# Patient Record
Sex: Female | Born: 1945 | Race: White | Hispanic: No | Smoking: Former smoker
Health system: Southern US, Community
[De-identification: ages and names within clinical notes are randomized; demographics above are authoritative.]

## PROBLEM LIST (undated history)

## (undated) DIAGNOSIS — I2510 Atherosclerotic heart disease of native coronary artery without angina pectoris: Secondary | ICD-10-CM

## (undated) DIAGNOSIS — I509 Heart failure, unspecified: Secondary | ICD-10-CM

## (undated) DIAGNOSIS — E119 Type 2 diabetes mellitus without complications: Secondary | ICD-10-CM

## (undated) DIAGNOSIS — I639 Cerebral infarction, unspecified: Secondary | ICD-10-CM

## (undated) DIAGNOSIS — I219 Acute myocardial infarction, unspecified: Secondary | ICD-10-CM

## (undated) DIAGNOSIS — E785 Hyperlipidemia, unspecified: Secondary | ICD-10-CM

## (undated) DIAGNOSIS — I10 Essential (primary) hypertension: Secondary | ICD-10-CM

## (undated) HISTORY — DX: Hyperlipidemia, unspecified: E78.5

## (undated) HISTORY — DX: Cerebral infarction, unspecified: I63.9

## (undated) HISTORY — DX: Type 2 diabetes mellitus without complications: E11.9

## (undated) HISTORY — PX: THYROIDECTOMY: SHX17

## (undated) HISTORY — PX: STENT PLACE LEFT URETER (ARMC HX): HXRAD1254

## (undated) HISTORY — PX: CATARACT EXTRACTION W/ INTRAOCULAR LENS  IMPLANT, BILATERAL: SHX1307

---

## 2008-10-30 DIAGNOSIS — I509 Heart failure, unspecified: Secondary | ICD-10-CM

## 2008-10-30 DIAGNOSIS — I219 Acute myocardial infarction, unspecified: Secondary | ICD-10-CM

## 2008-10-30 HISTORY — DX: Heart failure, unspecified: I50.9

## 2008-10-30 HISTORY — PX: CARDIAC CATHETERIZATION: SHX172

## 2008-10-30 HISTORY — DX: Acute myocardial infarction, unspecified: I21.9

## 2009-10-08 ENCOUNTER — Ambulatory Visit: Payer: Self-pay | Admitting: Internal Medicine

## 2009-10-08 ENCOUNTER — Inpatient Hospital Stay: Payer: Self-pay | Admitting: Internal Medicine

## 2009-11-29 ENCOUNTER — Ambulatory Visit: Payer: Self-pay | Admitting: Internal Medicine

## 2010-04-09 ENCOUNTER — Ambulatory Visit: Payer: Self-pay | Admitting: Family Medicine

## 2012-06-26 ENCOUNTER — Other Ambulatory Visit: Payer: Self-pay | Admitting: Internal Medicine

## 2012-06-26 LAB — LIPID PANEL
Cholesterol: 276 mg/dL — ABNORMAL HIGH (ref 0–200)
HDL Cholesterol: 37 mg/dL — ABNORMAL LOW (ref 40–60)
VLDL Cholesterol, Calc: 65 mg/dL — ABNORMAL HIGH (ref 5–40)

## 2012-06-26 LAB — COMPREHENSIVE METABOLIC PANEL
Anion Gap: 7 (ref 7–16)
Bilirubin,Total: 0.4 mg/dL (ref 0.2–1.0)
Calcium, Total: 10.6 mg/dL — ABNORMAL HIGH (ref 8.5–10.1)
Chloride: 105 mmol/L (ref 98–107)
Co2: 26 mmol/L (ref 21–32)
EGFR (African American): >60
EGFR (Non-African Amer.): 56 — ABNORMAL LOW
Osmolality: 279 (ref 275–301)
Potassium: 4.3 mmol/L (ref 3.5–5.1)
Sodium: 138 mmol/L (ref 136–145)

## 2014-02-10 DIAGNOSIS — I209 Angina pectoris, unspecified: Secondary | ICD-10-CM | POA: Diagnosis not present

## 2014-02-10 DIAGNOSIS — I2510 Atherosclerotic heart disease of native coronary artery without angina pectoris: Secondary | ICD-10-CM | POA: Diagnosis not present

## 2014-02-10 DIAGNOSIS — I10 Essential (primary) hypertension: Secondary | ICD-10-CM | POA: Diagnosis not present

## 2014-08-19 DIAGNOSIS — E785 Hyperlipidemia, unspecified: Secondary | ICD-10-CM | POA: Diagnosis not present

## 2014-08-19 DIAGNOSIS — F17200 Nicotine dependence, unspecified, uncomplicated: Secondary | ICD-10-CM | POA: Diagnosis not present

## 2014-08-19 DIAGNOSIS — I2510 Atherosclerotic heart disease of native coronary artery without angina pectoris: Secondary | ICD-10-CM | POA: Diagnosis not present

## 2014-08-19 DIAGNOSIS — I10 Essential (primary) hypertension: Secondary | ICD-10-CM | POA: Diagnosis not present

## 2015-02-16 DIAGNOSIS — I252 Old myocardial infarction: Secondary | ICD-10-CM | POA: Diagnosis not present

## 2015-02-16 DIAGNOSIS — Z13220 Encounter for screening for lipoid disorders: Secondary | ICD-10-CM | POA: Diagnosis not present

## 2015-02-16 DIAGNOSIS — I10 Essential (primary) hypertension: Secondary | ICD-10-CM | POA: Diagnosis not present

## 2015-02-16 DIAGNOSIS — I2510 Atherosclerotic heart disease of native coronary artery without angina pectoris: Secondary | ICD-10-CM | POA: Diagnosis not present

## 2015-02-17 DIAGNOSIS — E785 Hyperlipidemia, unspecified: Secondary | ICD-10-CM | POA: Diagnosis not present

## 2015-03-04 ENCOUNTER — Inpatient Hospital Stay
Admission: EM | Admit: 2015-03-04 | Discharge: 2015-03-10 | DRG: 339 | Disposition: A | Discharge status: Home / Self Care | Payer: Medicare Other | Attending: Surgery | Admitting: Surgery

## 2015-03-04 ENCOUNTER — Ambulatory Visit
Admit: 2015-03-04 | Discharge: 2015-03-04 | Disposition: A | Discharge status: Home / Self Care | Payer: Medicare Other | Attending: *Deleted | Admitting: *Deleted

## 2015-03-04 ENCOUNTER — Ambulatory Visit (INDEPENDENT_AMBULATORY_CARE_PROVIDER_SITE_OTHER)
Admission: EM | Admit: 2015-03-04 | Discharge: 2015-03-04 | Disposition: A | Discharge status: Home / Self Care | Payer: Medicare Other | Source: Home / Self Care

## 2015-03-04 ENCOUNTER — Inpatient Hospital Stay: Payer: Medicare Other | Admitting: Anesthesiology

## 2015-03-04 ENCOUNTER — Ambulatory Visit: Payer: Medicare Other

## 2015-03-04 ENCOUNTER — Inpatient Hospital Stay: Admission: AD | Admit: 2015-03-04 | Payer: Self-pay | Admitting: Surgery

## 2015-03-04 ENCOUNTER — Encounter
Admission: EM | Disposition: A | Discharge status: Home / Self Care | Payer: Self-pay | Source: Home / Self Care | Attending: Surgery

## 2015-03-04 DIAGNOSIS — K529 Noninfective gastroenteritis and colitis, unspecified: Secondary | ICD-10-CM | POA: Diagnosis not present

## 2015-03-04 DIAGNOSIS — I10 Essential (primary) hypertension: Secondary | ICD-10-CM | POA: Diagnosis present

## 2015-03-04 DIAGNOSIS — R1031 Right lower quadrant pain: Secondary | ICD-10-CM

## 2015-03-04 DIAGNOSIS — K353 Acute appendicitis with localized peritonitis: Secondary | ICD-10-CM | POA: Diagnosis present

## 2015-03-04 DIAGNOSIS — I509 Heart failure, unspecified: Secondary | ICD-10-CM | POA: Diagnosis present

## 2015-03-04 DIAGNOSIS — K3580 Unspecified acute appendicitis: Secondary | ICD-10-CM

## 2015-03-04 DIAGNOSIS — Z79899 Other long term (current) drug therapy: Secondary | ICD-10-CM | POA: Insufficient documentation

## 2015-03-04 DIAGNOSIS — R109 Unspecified abdominal pain: Secondary | ICD-10-CM

## 2015-03-04 DIAGNOSIS — Z7982 Long term (current) use of aspirin: Secondary | ICD-10-CM

## 2015-03-04 DIAGNOSIS — K913 Postprocedural intestinal obstruction: Secondary | ICD-10-CM | POA: Diagnosis present

## 2015-03-04 DIAGNOSIS — R1032 Left lower quadrant pain: Secondary | ICD-10-CM | POA: Diagnosis not present

## 2015-03-04 DIAGNOSIS — R110 Nausea: Secondary | ICD-10-CM

## 2015-03-04 DIAGNOSIS — R100 Acute abdomen: Secondary | ICD-10-CM | POA: Diagnosis not present

## 2015-03-04 DIAGNOSIS — Z87891 Personal history of nicotine dependence: Secondary | ICD-10-CM | POA: Diagnosis not present

## 2015-03-04 DIAGNOSIS — I2510 Atherosclerotic heart disease of native coronary artery without angina pectoris: Secondary | ICD-10-CM | POA: Diagnosis present

## 2015-03-04 HISTORY — DX: Heart failure, unspecified: I50.9

## 2015-03-04 HISTORY — DX: Atherosclerotic heart disease of native coronary artery without angina pectoris: I25.10

## 2015-03-04 HISTORY — PX: LAPAROSCOPIC APPENDECTOMY: SHX408

## 2015-03-04 HISTORY — DX: Essential (primary) hypertension: I10

## 2015-03-04 LAB — COMPREHENSIVE METABOLIC PANEL
ALT: 22 U/L (ref 14–54)
AST: 22 U/L (ref 15–41)
Albumin: 3.2 g/dL — ABNORMAL LOW (ref 3.5–5.0)
Alkaline Phosphatase: 128 U/L — ABNORMAL HIGH (ref 38–126)
Anion gap: 8 (ref 5–15)
BUN: 15 mg/dL (ref 6–20)
CO2: 24 mmol/L (ref 22–32)
Calcium: 10.3 mg/dL (ref 8.9–10.3)
Chloride: 103 mmol/L (ref 101–111)
Creatinine, Ser: 1.1 mg/dL — ABNORMAL HIGH (ref 0.44–1.00)
GFR calc Af Amer: 58 mL/min — ABNORMAL LOW (ref >60)
GFR calc non Af Amer: 50 mL/min — ABNORMAL LOW (ref >60)
Glucose, Bld: 171 mg/dL — ABNORMAL HIGH (ref 65–99)
Potassium: 4.5 mmol/L (ref 3.5–5.1)
Sodium: 135 mmol/L (ref 135–145)
Total Bilirubin: 0.6 mg/dL (ref 0.3–1.2)
Total Protein: 7.7 g/dL (ref 6.5–8.1)

## 2015-03-04 LAB — CBC WITH DIFFERENTIAL/PLATELET
Basophils Absolute: 0.1 10*3/uL (ref 0–0.1)
Basophils Relative: 1 %
Eosinophils Absolute: 0 10*3/uL (ref 0–0.7)
Eosinophils Relative: 0 %
HCT: 36.5 % (ref 35.0–47.0)
Hemoglobin: 12.1 g/dL (ref 12.0–16.0)
Lymphocytes Relative: 12 %
Lymphs Abs: 1.4 10*3/uL (ref 1.0–3.6)
MCH: 29.8 pg (ref 26.0–34.0)
MCHC: 33.2 g/dL (ref 32.0–36.0)
MCV: 89.8 fL (ref 80.0–100.0)
Monocytes Absolute: 1.2 10*3/uL — ABNORMAL HIGH (ref 0.2–0.9)
Monocytes Relative: 10 %
Neutro Abs: 8.9 10*3/uL — ABNORMAL HIGH (ref 1.4–6.5)
Neutrophils Relative %: 77 %
Platelets: 209 10*3/uL (ref 150–440)
RBC: 4.06 MIL/uL (ref 3.80–5.20)
RDW: 13.2 % (ref 11.5–14.5)
WBC: 11.7 10*3/uL — ABNORMAL HIGH (ref 3.6–11.0)

## 2015-03-04 LAB — LIPASE, BLOOD: Lipase: 44 U/L (ref 22–51)

## 2015-03-04 LAB — URINALYSIS COMPLETE WITH MICROSCOPIC (ARMC ONLY)
GLUCOSE, UA: NEGATIVE mg/dL
Ketones, ur: NEGATIVE mg/dL
Leukocytes, UA: NEGATIVE
Nitrite: NEGATIVE
PROTEIN: 100 mg/dL — AB
SPECIFIC GRAVITY, URINE: 1.03 (ref 1.005–1.030)
pH: 5.5 (ref 5.0–8.0)

## 2015-03-04 LAB — AMYLASE: Amylase: 52 U/L (ref 28–100)

## 2015-03-04 SURGERY — APPENDECTOMY, LAPAROSCOPIC
Anesthesia: General

## 2015-03-04 MED ORDER — PHENYLEPHRINE HCL 10 MG/ML IJ SOLN
INTRAMUSCULAR | Status: DC | PRN
  Administered 2015-03-04 (×2): 200 ug via INTRAVENOUS

## 2015-03-04 MED ORDER — ONDANSETRON HCL 4 MG/2ML IJ SOLN
INTRAMUSCULAR | Status: DC | PRN
  Administered 2015-03-04: 4 mg via INTRAVENOUS

## 2015-03-04 MED ORDER — FENTANYL CITRATE (PF) 100 MCG/2ML IJ SOLN
INTRAMUSCULAR | Status: DC | PRN
  Administered 2015-03-04: 100 ug via INTRAVENOUS
  Administered 2015-03-04: 50 ug via INTRAVENOUS

## 2015-03-04 MED ORDER — ONDANSETRON HCL 4 MG/2ML IJ SOLN: 4.0000 mg | Freq: Once | INTRAMUSCULAR | Status: AC | PRN

## 2015-03-04 MED ORDER — NEOSTIGMINE METHYLSULFATE 10 MG/10ML IV SOLN
INTRAVENOUS | Status: DC | PRN
  Administered 2015-03-04: 3 mg via INTRAVENOUS

## 2015-03-04 MED ORDER — HYDROCODONE-ACETAMINOPHEN 5-325 MG PO TABS: 1.0000 | ORAL_TABLET | Freq: Four times a day (QID) | ORAL | Status: DC | PRN

## 2015-03-04 MED ORDER — GLYCOPYRROLATE 0.2 MG/ML IJ SOLN
INTRAMUSCULAR | Status: DC | PRN
  Administered 2015-03-04: .5 mg via INTRAVENOUS

## 2015-03-04 MED ORDER — FENTANYL CITRATE (PF) 100 MCG/2ML IJ SOLN: 25.0000 ug | INTRAMUSCULAR | Status: DC | PRN

## 2015-03-04 MED ORDER — CEFTRIAXONE SODIUM IN DEXTROSE 20 MG/ML IV SOLN
1.0000 g | Freq: Once | INTRAVENOUS | Status: DC
  Filled 2015-03-04: qty 50

## 2015-03-04 MED ORDER — ACETAMINOPHEN 325 MG PO TABS: 650.0000 mg | ORAL_TABLET | Freq: Four times a day (QID) | ORAL | Status: DC | PRN

## 2015-03-04 MED ORDER — MIDAZOLAM HCL 2 MG/2ML IJ SOLN
INTRAMUSCULAR | Status: DC | PRN
  Administered 2015-03-04: 1 mg via INTRAVENOUS

## 2015-03-04 MED ORDER — PROMETHAZINE HCL 25 MG/ML IJ SOLN
INTRAMUSCULAR | Status: AC
  Filled 2015-03-04: qty 1

## 2015-03-04 MED ORDER — HEPARIN SODIUM (PORCINE) 5000 UNIT/ML IJ SOLN
5000.0000 [IU] | Freq: Three times a day (TID) | INTRAMUSCULAR | Status: DC
  Administered 2015-03-05 – 2015-03-10 (×15): 5000 [IU] via SUBCUTANEOUS
  Filled 2015-03-04 (×15): qty 1

## 2015-03-04 MED ORDER — CEFTRIAXONE SODIUM 1 G IJ SOLR
INTRAMUSCULAR | Status: AC
  Filled 2015-03-04: qty 10

## 2015-03-04 MED ORDER — ROCURONIUM BROMIDE 100 MG/10ML IV SOLN
INTRAVENOUS | Status: DC | PRN
  Administered 2015-03-04: 30 mg via INTRAVENOUS

## 2015-03-04 MED ORDER — DIPHENHYDRAMINE HCL 12.5 MG/5ML PO ELIX
12.5000 mg | ORAL_SOLUTION | Freq: Four times a day (QID) | ORAL | Status: DC | PRN
  Filled 2015-03-04: qty 5

## 2015-03-04 MED ORDER — PROPOFOL 10 MG/ML IV BOLUS
INTRAVENOUS | Status: DC | PRN
  Administered 2015-03-04: 100 mg via INTRAVENOUS

## 2015-03-04 MED ORDER — PROMETHAZINE HCL 25 MG/ML IJ SOLN
12.5000 mg | INTRAMUSCULAR | Status: DC | PRN
  Administered 2015-03-04 – 2015-03-08 (×2): 12.5 mg via INTRAVENOUS
  Filled 2015-03-04: qty 1

## 2015-03-04 MED ORDER — SUCCINYLCHOLINE CHLORIDE 20 MG/ML IJ SOLN
INTRAMUSCULAR | Status: DC | PRN
  Administered 2015-03-04: 100 mg via INTRAVENOUS

## 2015-03-04 MED ORDER — SODIUM CHLORIDE 0.9 % IV SOLN
INTRAVENOUS | Status: DC
  Administered 2015-03-04 – 2015-03-10 (×9): via INTRAVENOUS

## 2015-03-04 MED ORDER — MORPHINE SULFATE 2 MG/ML IJ SOLN
1.0000 mg | INTRAMUSCULAR | Status: DC | PRN
  Administered 2015-03-05: 1 mg via INTRAVENOUS
  Filled 2015-03-04: qty 1

## 2015-03-04 MED ORDER — IOHEXOL 300 MG/ML  SOLN
100.0000 mL | Freq: Once | INTRAMUSCULAR | Status: AC | PRN
  Administered 2015-03-04: 100 mL via INTRAVENOUS

## 2015-03-04 MED ORDER — HYDROMORPHONE HCL 1 MG/ML IJ SOLN: 1.0000 mg | INTRAMUSCULAR | Status: DC | PRN

## 2015-03-04 MED ORDER — BUPIVACAINE HCL 0.25 % IJ SOLN
INTRAMUSCULAR | Status: DC | PRN
  Administered 2015-03-04: 15 mL

## 2015-03-04 MED ORDER — LIDOCAINE HCL (CARDIAC) 20 MG/ML IV SOLN
INTRAVENOUS | Status: DC | PRN
  Administered 2015-03-04: 100 mg via INTRAVENOUS

## 2015-03-04 MED ORDER — DIPHENHYDRAMINE HCL 50 MG/ML IJ SOLN: 12.5000 mg | Freq: Four times a day (QID) | INTRAMUSCULAR | Status: DC | PRN

## 2015-03-04 MED ORDER — HYDROMORPHONE HCL 1 MG/ML IJ SOLN
INTRAMUSCULAR | Status: AC
  Administered 2015-03-04: 1 mg via INTRAVENOUS
  Filled 2015-03-04: qty 1

## 2015-03-04 MED ORDER — LACTATED RINGERS IV SOLN
INTRAVENOUS | Status: DC | PRN
  Administered 2015-03-04: 22:00:00 via INTRAVENOUS

## 2015-03-04 MED ORDER — CLINDAMYCIN PHOSPHATE 600 MG/50ML IV SOLN: 600.0000 mg | Freq: Once | INTRAVENOUS | Status: DC

## 2015-03-04 MED ORDER — CLINDAMYCIN PHOSPHATE 600 MG/50ML IV SOLN
600.0000 mg | Freq: Once | INTRAVENOUS | Status: AC
  Administered 2015-03-04: 600 mg via INTRAVENOUS

## 2015-03-04 MED ORDER — ACETAMINOPHEN 650 MG RE SUPP: 650.0000 mg | Freq: Four times a day (QID) | RECTAL | Status: DC | PRN

## 2015-03-04 MED ORDER — CEFTRIAXONE SODIUM 1 G IJ SOLR
1.0000 g | Freq: Once | INTRAMUSCULAR | Status: AC
Start: 2015-03-04 — End: 2015-03-04
  Administered 2015-03-04: 1 g via INTRAVENOUS

## 2015-03-04 MED ORDER — CLINDAMYCIN PHOSPHATE 600 MG/50ML IV SOLN
INTRAVENOUS | Status: AC
Start: 2015-03-04 — End: 2015-03-04
  Administered 2015-03-04: 600 mg via INTRAVENOUS
  Filled 2015-03-04: qty 50

## 2015-03-04 SURGICAL SUPPLY — 51 items
BENZOIN TINCTURE PRP APPL 2/3 (GAUZE/BANDAGES/DRESSINGS) ×3 IMPLANT
CANISTER SUCT 1200ML W/VALVE (MISCELLANEOUS) ×6 IMPLANT
CASTING MATERIAL DELTA LITE (CAST SUPPLIES) ×3 IMPLANT
CATH KIT 16FR FOLEY ×3 IMPLANT
CHLORAPREP W/TINT 26ML (MISCELLANEOUS) ×3 IMPLANT
CLEARIFY VISUALIZATION ×3 IMPLANT
CLOSURE WOUND 1/2 X4 (GAUZE/BANDAGES/DRESSINGS) ×1
CUTTER LINEAR ENDO 35 ART FLEX (STAPLE) ×3 IMPLANT
CUTTER LINEAR ENDO 35 ETS (STAPLE) IMPLANT
DEFOGGER SCOPE WARMER CLEARIFY (MISCELLANEOUS) ×3 IMPLANT
DRAPE SHEET LG 3/4 BI-LAMINATE (DRAPES) ×3 IMPLANT
DRAPE UTILITY 15X26 TOWEL STRL (DRAPES) ×6 IMPLANT
DRESSING TELFA 4X3 1S ST N-ADH (GAUZE/BANDAGES/DRESSINGS) IMPLANT
DRSG TEGADERM 2-3/8X2-3/4 SM (GAUZE/BANDAGES/DRESSINGS) ×9 IMPLANT
DRSG TEGADERM 2X2.25 PEDS (GAUZE/BANDAGES/DRESSINGS) ×3 IMPLANT
DRSG TELFA 3X8 NADH (GAUZE/BANDAGES/DRESSINGS) ×3 IMPLANT
ENDOPOUCH RETRIEVER 10 (MISCELLANEOUS) ×3 IMPLANT
GAUZE SPONGE 4X4 12PLY STRL (GAUZE/BANDAGES/DRESSINGS) IMPLANT
GLOVE BIO SURGEON STRL SZ7.5 (GLOVE) ×3 IMPLANT
GOWN STRL REUS W/ TWL LRG LVL3 (GOWN DISPOSABLE) ×1 IMPLANT
GOWN STRL REUS W/ TWL XL LVL3 (GOWN DISPOSABLE) ×1 IMPLANT
GOWN STRL REUS W/TWL LRG LVL3 (GOWN DISPOSABLE) ×2
GOWN STRL REUS W/TWL XL LVL3 (GOWN DISPOSABLE) ×2
IRRIGATION STRYKERFLOW (MISCELLANEOUS) ×1 IMPLANT
IRRIGATOR STRYKERFLOW (MISCELLANEOUS) ×3
IV NS 1000ML (IV SOLUTION) ×2
IV NS 1000ML BAXH (IV SOLUTION) ×1 IMPLANT
LABEL OR SOLS (LABEL) IMPLANT
NDL SAFETY 25GX1.5 (NEEDLE) ×3 IMPLANT
NS IRRIG 500ML POUR BTL (IV SOLUTION) ×3 IMPLANT
PACK LAP CHOLECYSTECTOMY (MISCELLANEOUS) ×3 IMPLANT
PAD GROUND ADULT SPLIT (MISCELLANEOUS) ×3 IMPLANT
RELOAD CUTTER ETS 35MM STAND (ENDOMECHANICALS) IMPLANT
SCD ×3 IMPLANT
SCISSORS METZENBAUM CVD 33 (INSTRUMENTS) ×3 IMPLANT
SHEARS HARMONIC ACE PLUS 36CM (ENDOMECHANICALS) ×3 IMPLANT
SLEEVE ENDOPATH XCEL 5M (ENDOMECHANICALS) ×3 IMPLANT
STRAP SAFETY BODY (MISCELLANEOUS) ×3 IMPLANT
STRIP CLOSURE SKIN 1/2X4 (GAUZE/BANDAGES/DRESSINGS) ×2 IMPLANT
SUT ETHILON 4-0 (SUTURE) ×4
SUT ETHILON 4-0 FS2 18XMFL BLK (SUTURE) ×2
SUT VIC AB 0 UR5 27 (SUTURE) ×6 IMPLANT
SUT VIC AB 4-0 RB1 27 (SUTURE) ×2
SUT VIC AB 4-0 RB1 27X BRD (SUTURE) ×1 IMPLANT
SUTURE ETHLN 4-0 FS2 18XMF BLK (SUTURE) ×2 IMPLANT
SWABSTK COMLB BENZOIN TINCTURE (MISCELLANEOUS) ×3 IMPLANT
TROCAR XCEL 12X100 BLDLESS (ENDOMECHANICALS) ×3 IMPLANT
TROCAR XCEL BLUNT TIP 100MML (ENDOMECHANICALS) ×3 IMPLANT
TROCAR XCEL NON-BLD 5MMX100MML (ENDOMECHANICALS) ×3 IMPLANT
TUBING INSUFFLATOR HI FLOW (MISCELLANEOUS) ×3 IMPLANT
TUBING SCD EXTENSION 9995 GYN (TUBING) ×3 IMPLANT

## 2015-03-04 SURGICAL SUPPLY — 38 items
CANISTER SUCT 1200ML W/VALVE (MISCELLANEOUS) IMPLANT
CHLORAPREP W/TINT 26ML (MISCELLANEOUS) IMPLANT
CUTTER LINEAR ENDO 35 ART FLEX (STAPLE) IMPLANT
CUTTER LINEAR ENDO 35 ETS (STAPLE) IMPLANT
DRAPE SHEET LG 3/4 BI-LAMINATE (DRAPES) IMPLANT
DRAPE UTILITY 15X26 TOWEL STRL (DRAPES) IMPLANT
DRESSING TELFA 4X3 1S ST N-ADH (GAUZE/BANDAGES/DRESSINGS) IMPLANT
DRSG TEGADERM 2-3/8X2-3/4 SM (GAUZE/BANDAGES/DRESSINGS) IMPLANT
ENDOPOUCH RETRIEVER 10 (MISCELLANEOUS) IMPLANT
GAUZE SPONGE 4X4 12PLY STRL (GAUZE/BANDAGES/DRESSINGS) IMPLANT
GLOVE BIO SURGEON STRL SZ7.5 (GLOVE) IMPLANT
GOWN STRL REUS W/ TWL LRG LVL3 (GOWN DISPOSABLE) IMPLANT
GOWN STRL REUS W/ TWL XL LVL3 (GOWN DISPOSABLE) IMPLANT
GOWN STRL REUS W/TWL LRG LVL3 (GOWN DISPOSABLE)
GOWN STRL REUS W/TWL XL LVL3 (GOWN DISPOSABLE)
IRRIGATION STRYKERFLOW (MISCELLANEOUS) IMPLANT
IRRIGATOR STRYKERFLOW (MISCELLANEOUS)
IV NS 1000ML (IV SOLUTION)
IV NS 1000ML BAXH (IV SOLUTION) IMPLANT
LABEL OR SOLS (LABEL) IMPLANT
NDL SAFETY 25GX1.5 (NEEDLE) IMPLANT
NS IRRIG 500ML POUR BTL (IV SOLUTION) IMPLANT
PACK LAP CHOLECYSTECTOMY (MISCELLANEOUS) IMPLANT
PAD GROUND ADULT SPLIT (MISCELLANEOUS) IMPLANT
RELOAD CUTTER ETS 35MM STAND (ENDOMECHANICALS) IMPLANT
SCISSORS METZENBAUM CVD 33 (INSTRUMENTS) IMPLANT
SHEARS HARMONIC ACE PLUS 36CM (ENDOMECHANICALS) IMPLANT
SLEEVE ENDOPATH XCEL 5M (ENDOMECHANICALS) IMPLANT
STRAP SAFETY BODY (MISCELLANEOUS) IMPLANT
STRIP CLOSURE SKIN 1/2X4 (GAUZE/BANDAGES/DRESSINGS) IMPLANT
SUT VIC AB 0 UR5 27 (SUTURE) IMPLANT
SUT VIC AB 4-0 RB1 27 (SUTURE)
SUT VIC AB 4-0 RB1 27X BRD (SUTURE) IMPLANT
SWABSTK COMLB BENZOIN TINCTURE (MISCELLANEOUS) IMPLANT
TROCAR XCEL 12X100 BLDLESS (ENDOMECHANICALS) IMPLANT
TROCAR XCEL BLUNT TIP 100MML (ENDOMECHANICALS) IMPLANT
TROCAR XCEL NON-BLD 5MMX100MML (ENDOMECHANICALS) IMPLANT
TUBING INSUFFLATOR HI FLOW (MISCELLANEOUS) IMPLANT

## 2015-03-04 NOTE — Progress Notes (Signed)
Assuming care from Dr Leanora Cover  History, PE, labs and ct scan reviewed C/w acute appendicitis, likely retrocecal in origin.  Plan laparoscopic appendectomy later tonight.  Discussed procedure with her and multiple family members.  All questions addressed.  Orders reviewed.

## 2015-03-04 NOTE — ED Provider Notes (Signed)
CSN: [id number]     Arrival date & time 03/04/15  0927 History   None    Chief Complaint  Patient presents with  . Abdominal Pain   (Consider location/radiation/quality/duration/timing/severity/associated sxs/prior Treatment) Patient is a 69 y.o. female presenting with abdominal pain. The history is provided by the patient and a relative. No language interpreter was used.  Abdominal Pain Pain location:  RLQ and LLQ Pain quality: cramping   Pain radiates to:  Does not radiate Pain severity:  Moderate (5/10) Onset quality:  Sudden Duration: 4-5. Timing:  Constant Progression:  Waxing and waning Chronicity:  New Context: recent illness   Context: not diet changes, not laxative use, not recent travel, not sick contacts, not suspicious food intake and not trauma   Relieved by:  OTC medications (ibuprofen) Associated symptoms: nausea and vomiting   Associated symptoms: no belching, no chest pain, no chills, no constipation, no diarrhea, no dysuria, no fever, no hematuria, no shortness of breath, no vaginal bleeding and no vaginal discharge   Risk factors: being elderly and obesity     Past Medical History  Diagnosis Date  . Coronary artery disease   . CHF (congestive heart failure) 2010  . Hypertension    Past Surgical History  Procedure Laterality Date  . Stent place left ureter ([facility] hx)      cardiac  . Thyroidectomy Bilateral    Family History  Problem Relation Age of Onset  . Heart block Mother   . Heart block Father    History  Substance Use Topics  . Smoking status: Former Smoker    Quit date: 03/03/2009  . Smokeless tobacco: Not on file  . Alcohol Use: No   OB History    No data available     Review of Systems  Constitutional: Positive for appetite change. Negative for fever and chills.  HENT: Negative.   Respiratory: Negative for shortness of breath.   Cardiovascular: Negative for chest pain.  Gastrointestinal: Positive for nausea, vomiting and abdominal  pain. Negative for diarrhea and constipation.  Endocrine: Negative.   Genitourinary: Negative for dysuria, hematuria, vaginal bleeding and vaginal discharge.  Skin: Negative for rash.  Allergic/Immunologic: Negative.   Neurological: Negative.   Hematological: Negative.   Psychiatric/Behavioral: Negative.   All other systems reviewed and are negative.   Allergies  Review of patient's allergies indicates no known allergies.  Home Medications   Prior to Admission medications   Medication Sig Start Date End Date Taking? Authorizing Provider  aspirin 81 MG tablet Take 81 mg by mouth daily.   Yes Historical Provider, MD  atorvastatin (LIPITOR) 80 MG tablet Take 80 mg by mouth daily.   Yes Historical Provider, MD  irbesartan (AVAPRO) 75 MG tablet Take 75 mg by mouth daily.   Yes Historical Provider, MD  metoprolol (LOPRESSOR) 100 MG tablet Take 100 mg by mouth 2 (two) times daily.   Yes Historical Provider, MD   BP 126/72 mmHg  Pulse 88  Temp(Src) 96.7 F (35.9 C) (Tympanic)  Resp 16  Ht 5\' 1"  (1.549 m)  Wt 164 lb (74.39 kg)  BMI 31.00 kg/m2  SpO2 94% Physical Exam  Constitutional: She appears well-developed and well-nourished. She is active and cooperative.  HENT:  Head: Normocephalic.  Right Ear: Tympanic membrane normal.  Left Ear: Tympanic membrane normal.  Nose: Nose normal.  Mouth/Throat: Uvula is midline, oropharynx is clear and moist and mucous membranes are normal.  Neck: Trachea normal.  Cardiovascular: Normal rate, regular rhythm, normal heart  sounds and normal pulses.   Pulmonary/Chest: Effort normal and breath sounds normal.  Abdominal: Normal appearance and bowel sounds are normal. There is tenderness in the right lower quadrant and left lower quadrant. There is no rebound. No hernia.  Neurological: She is alert. No cranial nerve deficit or sensory deficit. GCS eye subscore is 4. GCS verbal subscore is 5. GCS motor subscore is 6.  Psychiatric: She has a normal mood  and affect. Her speech is normal.  Nursing note and vitals reviewed.   ED Course  Procedures (including critical care time) Labs Review Labs Reviewed  URINALYSIS COMPLETEWITH MICROSCOPIC [facility])  - Abnormal; Notable for the following:    APPearance HAZY (*)    Bilirubin Urine 1+ (*)    Hgb urine dipstick 1+ (*)    Protein, ur 100 (*)    Bacteria, UA FEW (*)    Squamous Epithelial / LPF 6-30 (*)    All other components within normal limits  CBC WITH DIFFERENTIAL/PLATELET - Abnormal; Notable for the following:    WBC 11.7 (*)    Neutro Abs 8.9 (*)    Monocytes Absolute 1.2 (*)    All other components within normal limits  COMPREHENSIVE METABOLIC PANEL - Abnormal; Notable for the following:    Glucose, Bld 171 (*)    Creatinine, Ser 1.10 (*)    Albumin 3.2 (*)    Alkaline Phosphatase 128 (*)    GFR calc non Af Amer 50 (*)    GFR calc Af Amer 58 (*)    All other components within normal limits  AMYLASE  LIPASE, BLOOD   Results for orders placed or performed during the hospital encounter of 03/04/15  Urinalysis complete, with microscopic  Result Value Ref Range   Color, Urine YELLOW YELLOW   APPearance HAZY (A) CLEAR   Glucose, UA NEGATIVE NEGATIVE mg/dL   Bilirubin Urine 1+ (A) NEGATIVE   Ketones, ur NEGATIVE NEGATIVE mg/dL   Specific Gravity, Urine 1.030 1.005 - 1.030   Hgb urine dipstick 1+ (A) NEGATIVE   pH 5.5 5.0 - 8.0   Protein, ur 100 (A) NEGATIVE mg/dL   Nitrite NEGATIVE NEGATIVE   Leukocytes, UA NEGATIVE NEGATIVE   RBC / HPF 0-5 <3 RBC/hpf   WBC, UA 0-5 <3 WBC/hpf   Bacteria, UA FEW (A) RARE   Squamous Epithelial / LPF 6-30 (A) RARE  CBC with Differential  Result Value Ref Range   WBC 11.7 (H) 3.6 - 11.0 K/uL   RBC 4.06 3.80 - 5.20 MIL/uL   Hemoglobin 12.1 12.0 - 16.0 g/dL   HCT 36.5 35.0 - 47.0 %   MCV 89.8 80.0 - 100.0 fL   MCH 29.8 26.0 - 34.0 pg   MCHC 33.2 32.0 - 36.0 g/dL   RDW 13.2 11.5 - 14.5 %   Platelets 209 150 - 440 K/uL   Neutrophils  Relative % 77 %   Neutro Abs 8.9 (H) 1.4 - 6.5 K/uL   Lymphocytes Relative 12 %   Lymphs Abs 1.4 1.0 - 3.6 K/uL   Monocytes Relative 10 %   Monocytes Absolute 1.2 (H) 0.2 - 0.9 K/uL   Eosinophils Relative 0 %   Eosinophils Absolute 0.0 0 - 0.7 K/uL   Basophils Relative 1 %   Basophils Absolute 0.1 0 - 0.1 K/uL  Comprehensive metabolic panel  Result Value Ref Range   Sodium 135 135 - 145 mmol/L   Potassium 4.5 3.5 - 5.1 mmol/L   Chloride 103 101 - 111 mmol/L  CO2 24 22 - 32 mmol/L   Glucose, Bld 171 (H) 65 - 99 mg/dL   BUN 15 6 - 20 mg/dL   Creatinine, Ser 1.10 (H) 0.44 - 1.00 mg/dL   Calcium 10.3 8.9 - 10.3 mg/dL   Total Protein 7.7 6.5 - 8.1 g/dL   Albumin 3.2 (L) 3.5 - 5.0 g/dL   AST 22 15 - 41 U/L   ALT 22 14 - 54 U/L   Alkaline Phosphatase 128 (H) 38 - 126 U/L   Total Bilirubin 0.6 0.3 - 1.2 mg/dL   GFR calc non Af Amer 50 (L) >60 mL/min   GFR calc Af Amer 58 (L) >60 mL/min   Anion gap 8 5 - 15  Amylase  Result Value Ref Range   Amylase 52 28 - 100 U/L  Lipase, blood  Result Value Ref Range   Lipase 44 22 - 51 U/L    Imaging Review Ct Abdomen Pelvis W Contrast  03/04/2015   CLINICAL DATA:  69 year old female with abdominal pain for 5 days. Initial encounter.  EXAM: CT ABDOMEN AND PELVIS WITH CONTRAST  TECHNIQUE: Multidetector CT imaging of the abdomen and pelvis was performed using the standard protocol following bolus administration of intravenous contrast.  CONTRAST:  155mL OMNIPAQUE IOHEXOL 300 MG/ML  SOLN  COMPARISON:  Abdominal series 1141 hr today. CT Abdomen and Pelvis 10/08/2009.  FINDINGS: Increased platelike atelectasis along the major fissures today, but otherwise improved lung base ventilation compared to 2010. No pericardial or pleural effusion.  No acute osseous abnormality identified.  No pelvic free fluid. Negative rectum. Negative uterus and adnexa. Diminutive bladder.  Mild sigmoid diverticulosis, no definite active inflammation. Negative left colon.  Negative transverse colon.  Oral contrast has reached the hepatic flexure. There is confluent inflammation in the right lower quadrant affecting both the cecum and distal small bowel, but epicenter of the inflammation is at the expected origin of the appendix. Today no normal appendix is identified, but rather there is an abnormal elongated fluid collection measuring up to 16 mm diameter and 5 cm in length (see coronal image 73) with adjacent probable thickened and inflamed appendix, containing no gas or contrast. See series 2, image 62. Associated large and small bowel mesenteric stranding in this region. No bowel obstruction.  No dilated small bowel. Negative stomach and duodenum. No abdominal free fluid. Liver, spleen, pancreas and adrenal glands are within normal limits. Chronic cholelithiasis. Stable biliary tree. No abdominal free fluid or free air. Renal enhancement and contrast excretion within normal limits. Portal venous system is patent. Aortoiliac calcified atherosclerosis noted. Major arterial structures are patent. There is ectasia of the infrarenal abdominal aorta measuring up to 25 mm diameter. No lymphadenopathy.  IMPRESSION: 1. Moderate to severe right lower quadrant inflammation favored due to acute appendicitis. An elongated 5 cm x 1.6 cm fluid collection is suspicious for abscess along the inflamed appendix. See coronal image 73. No free fluid elsewhere and no free air. 2. Secondary inflammation of the cecum and distal small bowel. No bowel obstruction. 3. 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 Recommend followup by ultrasound in 5 years. This recommendation follows ACR consensus guidelines: White Paper of the ACR Incidental Findings Committee II on Vascular Findings. J Am Coll Radiol 2013; 10:789-794.   Electronically Signed   By: Genevie Ann M.D.   On: 03/04/2015 15:12   Dg Abd 2 Views  03/04/2015   CLINICAL DATA:  Right lower quadrant pain lie now with  bilateral lower quadrant pain for several days and nausea  EXAM: ABDOMEN - 2 VIEW  COMPARISON:  CT abdomen pelvis of 10/08/2009  FINDINGS: Supine and erect views of the abdomen show no bowel obstruction. No free air is seen. There are calcifications in the right upper quadrant which correlating with CT probably represent small gallstones. No definite calcifications are seen overlying the renal outlines. No bony abnormality is seen. Chronic change at the lung bases remains.  IMPRESSION: No bowel obstruction.  No free air.  Probable small gallstones.   Electronically Signed   By: Ivar Drape M.D.   On: 03/04/2015 12:02     Chief Complaint    Abdominal Pain       MDM   1. Abdominal pain, acute   2. Acute appendicitis, unspecified acute appendicitis type     1110: Labs pending. ABD XRAY pending.   1525: Received verbal report from radiology acute appendicitis with possible abscess/fluid collection.Spoke with pt who is at [facility] CT advised of CT findings and need to go straight to Er for further evaluation/surgery eval. Pt to remain NPO. Pt verbalized understanding to this provider. Dr. Alveta Heimlich, attending aware of pt status and agrees with plan of care.   Tori Milks, NP 38/18/40 [id number]

## 2015-03-04 NOTE — ED Notes (Signed)
Saturday afternoon after eating, sudden vomiting-vomited many times. Subsided Sunday morning. Slight RLQ pain Sunday-intermittent. Today pain LLQ-intermittent. No nausea. Denies fever. Color pink, skin cool and dry

## 2015-03-04 NOTE — Anesthesia Postprocedure Evaluation (Signed)
  Anesthesia Post-op Note  Patient: Patricia Lawson  Procedure(s) Performed: Procedure(s): APPENDECTOMY LAPAROSCOPIC (N/A)  Anesthesia type:General  Patient location: PACU  Post pain: Pain level controlled  Post assessment: Post-op Vital signs reviewed, Patient's Cardiovascular Status Stable, Respiratory Function Stable, Patent Airway and No signs of Nausea or vomiting  Post vital signs: Reviewed and stable  Last Vitals:  Filed Vitals:   03/04/15 1923  BP: 149/72  Pulse: 85  Temp: 37.1 C  Resp: 16    Level of consciousness: awake, alert  and patient cooperative  Complications: No apparent anesthesia complications

## 2015-03-04 NOTE — Progress Notes (Addendum)
Patient ID: Patricia Lawson, female   DOB: [birth year], 69 y.o.   MRN: [id number] 03/04/2015 OPERATIVE NOTE   10:56 PM  PATIENT:  Patricia Lawson  69 y.o. female  PRE-OPERATIVE DIAGNOSIS:  acute appendicitis with rupture   POST-OPERATIVE DIAGNOSIS:  same as preop  PROCEDURE:  Procedure(s): APPENDECTOMY LAPAROSCOPIC (N/A)  SURGEON:  Surgeon(s) and Role:    Sherri Rad, MD - Primary  Findings acute appendicitis with localized perforation and pus  Specimens appendix to pathology  EBL blood loss 25 cc  Lap and needle count correct 2   DICTATION:    With informed consent supine position general endotracheal anesthesia the patient's abdomen was clipped of hair sterilely prepped and draped with ChloraPrep solution and a time out was observed.  A 12 mm blunt Hassan trocar was placed in an open technique through an infra-umbilical transverse skin incision with stay sutures being passed to the fascia. Pneumoperitoneum was established. A 5 mm blade less trocar later exchanged with 12 mm bladed trocar was placed in the right upper quadrant along with a 5 mm bladed trocar in the left lower quadrant. There were adhesions of small bowel to the right lower quadrant which were taken down blunt technique and a large amount of pus was extruded and aspirated.  The appendix is identified at the confluence of the tinea along with the terminal ileum. Mesoappendix was divided with the Harmonic scalpel.  The base of the appendix was clearly identified. A GIA stapler with blue load application was applied and single fire demonstrated specimen to be retrievable through an Endo Catch device. Pneumoperitoneum was reestablished. The right lower quadrant and abdomen was irrigated with total of 2 L of normal saline during the operation and aspirated dry. Hemostasis appeared excellent on the operative field.  Ports are then removed under direct visualization. The infraorbital fascial defect. Approximately with an  additional figure-of-eight #0 Vicryl suture for orientation. A total of 30 cc of 0.25% plain Marcaine was infiltrated along all skin fascial incisions prior to closure. 4-0 nylon in simple and vertical mattress orientation was used to pre-approximate skin edges. Telfa and Tegaderm were then applied patient wasn't subsequently extubated to the recovery room in stable and satisfactory condition by anesthesia services.

## 2015-03-04 NOTE — Anesthesia Preprocedure Evaluation (Addendum)
Anesthesia Evaluation  Patient identified by MRN, date of birth, ID band Patient awake    History of Anesthesia Complications Negative for: history of anesthetic complications  Airway Mallampati: III       Dental   Pulmonary COPDCurrent Smoker, former smoker,  + rhonchi   + wheezing      Cardiovascular Exercise Tolerance: Poor hypertension, + CAD, + Past MI and +CHF Normal cardiovascular exam    Neuro/Psych Anxiety negative neurological ROS     GI/Hepatic negative GI ROS, Neg liver ROS,   Endo/Other  Hypothyroidism   Renal/GU   negative genitourinary   Musculoskeletal negative musculoskeletal ROS (+)   Abdominal (+)  Abdomen: rigid.    Peds negative pediatric ROS (+)  Hematology negative hematology ROS (+)   Anesthesia Other Findings   Reproductive/Obstetrics negative OB ROS                            Anesthesia Physical Anesthesia Plan  ASA: III and emergent  Anesthesia Plan: General   Post-op Pain Management:    Induction: Intravenous  Airway Management Planned: Oral ETT  Additional Equipment:   Intra-op Plan:   Post-operative Plan: Extubation in OR  Informed Consent: I have reviewed the patients History and Physical, chart, labs and discussed the procedure including the risks, benefits and alternatives for the proposed anesthesia with the patient or authorized representative who has indicated his/her understanding and acceptance.     Plan Discussed with: CRNA and Surgeon  Anesthesia Plan Comments:         Anesthesia Quick Evaluation

## 2015-03-04 NOTE — Anesthesia Procedure Notes (Signed)
Procedure Name: Intubation Date/Time: 03/04/2015 9:45 PM Performed by: Rosaria Ferries, Jahmai Finelli Pre-anesthesia Checklist: Patient identified, Emergency Drugs available, Suction available and Patient being monitored Patient Re-evaluated:Patient Re-evaluated prior to inductionOxygen Delivery Method: Circle system utilized Preoxygenation: Pre-oxygenation with 100% oxygen Laryngoscope Size: Mac and 3 Grade View: Grade I Tube type: Oral Tube size: 7.0 mm Number of attempts: 1 Placement Confirmation: ETT inserted through vocal cords under direct vision and breath sounds checked- equal and bilateral Secured at: 21 cm Tube secured with: Tape

## 2015-03-04 NOTE — ED Notes (Signed)
Patient is resting comfortably. 

## 2015-03-04 NOTE — ED Notes (Signed)
pt to ED room 17 from CT.  she was sent for acute appendicitis.   pt c/o RLQ pain x 4 days,  today she reports the pain became worse and now she is also having pain in the LLQ.  +decreased appetite,  nausea and vomiting on day one but none now

## 2015-03-04 NOTE — Transfer of Care (Signed)
Immediate Anesthesia Transfer of Care Note  Patient: Patricia Lawson  Procedure(s) Performed: Procedure(s): APPENDECTOMY LAPAROSCOPIC (N/A)  Patient Location: PACU  Anesthesia Type:General  Level of Consciousness: awake, alert  and oriented  Airway & Oxygen Therapy: Patient Spontanous Breathing and Patient connected to nasal cannula oxygen  Post-op Assessment: Report given to RN, Post -op Vital signs reviewed and stable and Patient moving all extremities X 4  Post vital signs: Reviewed and stable  Last Vitals:  Filed Vitals:   03/04/15 1923  BP: 149/72  Pulse: 85  Temp: 37.1 C  Resp: 16    Complications: No apparent anesthesia complications

## 2015-03-04 NOTE — H&P (Signed)
Patient ID: Patricia Lawson, female   DOB: [birth year], 69 y.o.   MRN: [id number]  HPI Patricia Lawson is a 69 y.o. female with vomiting since Saturday. She had some intermittent abdominal pain on Sunday. This pain got significantly worse and became bilateral in her lower quadrants this morning and she sought medical attention. She denies fever, chills, anorexia and any further nausea or vomiting, and in fact she says she is hungry. Her last meal was yesterday. No associated hematemesis, diarrhea, melena, or bright red blood per rectum.  Past Medical History  Diagnosis Date  . Coronary artery disease   . CHF (congestive heart failure) 2010  . Hypertension     Past Surgical History  Procedure Laterality Date  . Stent place left ureter ([facility] hx)      cardiac  . Thyroidectomy Bilateral     Family History  Problem Relation Age of Onset  . Heart block Mother   . Heart block Father    patient's mother died at age 10 of congestive heart failure and her father died at age 34 of mesenteric ischemia.  Social History History  Substance Use Topics  . Smoking status: Former Smoker    Quit date: 03/03/2009  . Smokeless tobacco: Not on file  . Alcohol Use: No   patient is divorced, she lives alone, she does not smoke and does not drink. She retired from working in [employer] about 10 years ago.  No Known Allergies  Current Facility-Administered Medications  Medication Dose Route Frequency Provider Last Rate Last Dose  . cefTRIAXone (ROCEPHIN) 1 g in dextrose 5 % 50 mL IVPB  1 g Intravenous Once Molly Maduro, MD      . clindamycin (CLEOCIN) IVPB 600 mg  600 mg Intravenous Once Molly Maduro, MD       Current Outpatient Prescriptions  Medication Sig Dispense Refill  . aspirin 81 MG tablet Take 81 mg by mouth daily.    Marland Kitchen atorvastatin (LIPITOR) 80 MG tablet Take 80 mg by mouth daily.    . irbesartan (AVAPRO) 75 MG tablet Take 75 mg by mouth daily.    . metoprolol (LOPRESSOR) 100 MG  tablet Take 100 mg by mouth 2 (two) times daily.      Review of Systems A 10 point review of systems was asked and was negative except for the following positive findings: Vomiting and abdominal pain.  Blood pressure 142/93, pulse 84, temperature 98.1 F (36.7 C), temperature source Oral, SpO2 88 %.  Physical Exam CONSTITUTIONAL: Well-developed, well-nourished, pleasant elderly white female, lying comfortably in the emergency department stretcher. EYES: Pupils equal, round, and reactive to light, Sclera non-icteric. EARS, NOSE, MOUTH AND THROAT: The oropharynx is clear. Oral mucosa is pink and moist. Hearing is intact to voice.  NECK: Trachea is midline, and there is no jugular venous distension. Thyroid is without palpable abnormalities. LYMPH NODES:  Lymph nodes in the neck are not enlarged. RESPIRATORY:  Lungs are clear, and breath sounds are equal bilaterally. Normal respiratory effort without pathologic use of accessory muscles. CARDIOVASCULAR: Heart is regular without murmurs, gallops, or rubs. GI: The abdomen is soft, and nondistended. There were no palpable masses. There was no hepatosplenomegaly. There were normal bowel sounds. She had mild lower abdominal tenderness, worse on the right than the left, with some mild rebound and guarding. GU: Deferred. MUSCULOSKELETAL:  Normal muscle strength and tone in all four extremities.    SKIN: Skin turgor is normal. There are no pathologic skin lesions.  NEUROLOGIC:  Motor and sensation is grossly normal.  Cranial nerves are grossly intact. PSYCH:  Alert and oriented to person, place and time. Affect is normal.  Data Reviewed CT scan and all laboratory values. I have personally reviewed the patient's imaging and medical records.    Assessment    Acute appendicitis with rupture and localized intra-abdominal pus.  Plan    Admit to hospital, IV fluids, IV antibiotics, analgesics, antibiotics, and Laparoscopic appendectomy, by my  partner, Dr. Sherri Rad, later this evening. The patient understands the plan, likely hospital course, risks, and wishes to proceed.  Face-to-face time spent with the patient and care providers was 50 minutes, with more than 50% of the time spent counseling, educating, and coordinating care of the patient.     Consuela Mimes 03/04/2015, 5:10 PM

## 2015-03-04 NOTE — ED Provider Notes (Signed)
Miss Patricia Lawson was evaluated at [facility] acute care office and [place]. They performed a full evaluation and workup including CT scan. The patient was seen directly by the surgeon here at [facility] in [place].  Ahmed Prima, MD 03/04/15 2226

## 2015-03-04 NOTE — ED Notes (Signed)
Patient to begin oral contrast for CT Abdomen/Pelvis and to go to [facility] now. This nurse spoke with scheduling. Patient to go now.

## 2015-03-05 MED ORDER — METOPROLOL SUCCINATE ER 100 MG PO TB24
100.0000 mg | ORAL_TABLET | Freq: Every day | ORAL | Status: DC
  Administered 2015-03-05 – 2015-03-10 (×6): 100 mg via ORAL
  Filled 2015-03-05 (×6): qty 1

## 2015-03-05 MED ORDER — CEFTRIAXONE SODIUM IN DEXTROSE 20 MG/ML IV SOLN
1.0000 g | Freq: Once | INTRAVENOUS | Status: AC
  Administered 2015-03-05: 1 g via INTRAVENOUS
  Filled 2015-03-05: qty 50

## 2015-03-05 MED ORDER — CLINDAMYCIN PHOSPHATE 600 MG/50ML IV SOLN
600.0000 mg | Freq: Three times a day (TID) | INTRAVENOUS | Status: DC
  Administered 2015-03-05 – 2015-03-10 (×15): 600 mg via INTRAVENOUS
  Filled 2015-03-05 (×23): qty 50

## 2015-03-05 MED ORDER — CEFTRIAXONE SODIUM IN DEXTROSE 20 MG/ML IV SOLN: 1.0000 g | INTRAVENOUS | Status: DC

## 2015-03-05 MED ORDER — ATORVASTATIN CALCIUM 20 MG PO TABS
80.0000 mg | ORAL_TABLET | Freq: Every day | ORAL | Status: DC
  Administered 2015-03-05 – 2015-03-10 (×6): 80 mg via ORAL
  Filled 2015-03-05 (×6): qty 4

## 2015-03-05 MED ORDER — CEFTRIAXONE SODIUM IN DEXTROSE 20 MG/ML IV SOLN
1.0000 g | INTRAVENOUS | Status: DC
  Administered 2015-03-06: 1 g via INTRAVENOUS
  Filled 2015-03-05 (×3): qty 50

## 2015-03-05 NOTE — Progress Notes (Signed)
1 Day Post-Op   Subjective: Patient says her abdominal pain is worse today than it was preoperatively. She has had some nausea but has not vomited. No bowel function as a of yet.  Vital signs in last 24 hours: Temp:  [97.5 F (36.4 C)-99.2 F (37.3 C)] 97.9 F (36.6 C) (05/06 1114) Pulse Rate:  [62-96] 81 (05/06 1114) Resp:  [16-27] 18 (05/06 0742) BP: (117-157)/(51-93) 117/51 mmHg (05/06 1114) SpO2:  [88 %-100 %] 95 % (05/06 1114) Weight:  [74.435 kg (164 lb 1.6 oz)] 74.435 kg (164 lb 1.6 oz) (05/05 1923) Last BM Date: 03/03/15  Intake/Output from previous day: 05/05 0701 - 05/06 0700 In: 2734 [I.V.:2734] Out: 225 [Urine:200; Blood:25]  GI: soft, non-tender; bowel sounds normal; no masses,  no organomegaly  Lab Results:  CBC  Recent Labs  03/04/15 1119  WBC 11.7*  HGB 12.1  HCT 36.5  PLT 209   CMP     Component Value Date/Time   NA 135 03/04/2015 1119   NA 138 06/26/2012 1409   K 4.5 03/04/2015 1119   K 4.3 06/26/2012 1409   CL 103 03/04/2015 1119   CL 105 06/26/2012 1409   CO2 24 03/04/2015 1119   CO2 26 06/26/2012 1409   GLUCOSE 171* 03/04/2015 1119   GLUCOSE 119* 06/26/2012 1409   BUN 15 03/04/2015 1119   BUN 18 06/26/2012 1409   CREATININE 1.10* 03/04/2015 1119   CREATININE 1.04 06/26/2012 1409   CALCIUM 10.3 03/04/2015 1119   CALCIUM 10.6* 06/26/2012 1409   PROT 7.7 03/04/2015 1119   PROT 8.2 06/26/2012 1409   ALBUMIN 3.2* 03/04/2015 1119   ALBUMIN 4.1 06/26/2012 1409   AST 22 03/04/2015 1119   AST 39* 06/26/2012 1409   ALT 22 03/04/2015 1119   ALT 51 06/26/2012 1409   ALKPHOS 128* 03/04/2015 1119   ALKPHOS 126 06/26/2012 1409   BILITOT 0.6 03/04/2015 1119   GFRNONAA 50* 03/04/2015 1119   GFRNONAA 56* 06/26/2012 1409   GFRAA 58* 03/04/2015 1119   GFRAA >60 06/26/2012 1409   PT/INR No results for input(s): LABPROT, INR in the last 72 hours.  Studies/Results: Ct Abdomen Pelvis W Contrast  03/04/2015   CLINICAL DATA:  69 year old female  with abdominal pain for 5 days. Initial encounter.  EXAM: CT ABDOMEN AND PELVIS WITH CONTRAST  TECHNIQUE: Multidetector CT imaging of the abdomen and pelvis was performed using the standard protocol following bolus administration of intravenous contrast.  CONTRAST:  13mL OMNIPAQUE IOHEXOL 300 MG/ML  SOLN  COMPARISON:  Abdominal series 1141 hr today. CT Abdomen and Pelvis 10/08/2009.  FINDINGS: Increased platelike atelectasis along the major fissures today, but otherwise improved lung base ventilation compared to 2010. No pericardial or pleural effusion.  No acute osseous abnormality identified.  No pelvic free fluid. Negative rectum. Negative uterus and adnexa. Diminutive bladder.  Mild sigmoid diverticulosis, no definite active inflammation. Negative left colon. Negative transverse colon.  Oral contrast has reached the hepatic flexure. There is confluent inflammation in the right lower quadrant affecting both the cecum and distal small bowel, but epicenter of the inflammation is at the expected origin of the appendix. Today no normal appendix is identified, but rather there is an abnormal elongated fluid collection measuring up to 16 mm diameter and 5 cm in length (see coronal image 73) with adjacent probable thickened and inflamed appendix, containing no gas or contrast. See series 2, image 62. Associated large and small bowel mesenteric stranding in this region. No bowel obstruction.  No dilated small bowel. Negative stomach and duodenum. No abdominal free fluid. Liver, spleen, pancreas and adrenal glands are within normal limits. Chronic cholelithiasis. Stable biliary tree. No abdominal free fluid or free air. Renal enhancement and contrast excretion within normal limits. Portal venous system is patent. Aortoiliac calcified atherosclerosis noted. Major arterial structures are patent. There is ectasia of the infrarenal abdominal aorta measuring up to 25 mm diameter. No lymphadenopathy.  IMPRESSION: 1. Moderate  to severe right lower quadrant inflammation favored due to acute appendicitis. An elongated 5 cm x 1.6 cm fluid collection is suspicious for abscess along the inflamed appendix. See coronal image 73. No free fluid elsewhere and no free air. 2. Secondary inflammation of the cecum and distal small bowel. No bowel obstruction. 3. 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 Recommend followup by ultrasound in 5 years. This recommendation follows ACR consensus guidelines: White Paper of the ACR Incidental Findings Committee II on Vascular Findings. J Am Coll Radiol 2013; 10:789-794.   Electronically Signed   By: Genevie Ann M.D.   On: 03/04/2015 15:12   Dg Abd 2 Views  03/04/2015   CLINICAL DATA:  Right lower quadrant pain lie now with bilateral lower quadrant pain for several days and nausea  EXAM: ABDOMEN - 2 VIEW  COMPARISON:  CT abdomen pelvis of 10/08/2009  FINDINGS: Supine and erect views of the abdomen show no bowel obstruction. No free air is seen. There are calcifications in the right upper quadrant which correlating with CT probably represent small gallstones. No definite calcifications are seen overlying the renal outlines. No bony abnormality is seen. Chronic change at the lung bases remains.  IMPRESSION: No bowel obstruction.  No free air.  Probable small gallstones.   Electronically Signed   By: Ivar Drape M.D.   On: 03/04/2015 12:02    Assessment/Plan: Expected course one day after ruptured appendix. Continue IV antibiotics.

## 2015-03-05 NOTE — Plan of Care (Signed)
Problem: Discharge Progression Outcomes Goal: Discharge plan in place and appropriate Pt on clear liquid diet.  No c/o pain at this time.  Foley d/c'd at 0050. Pt attempting to void at this time.  No significant changes.

## 2015-03-05 NOTE — Progress Notes (Signed)
Pt admitted to 2c from ED. Lap appendectomy done last night. No c/o pain voiced. No n/v noted.  Foley removed and pt to void by 0850.  Continues on IV antibiotics.  VSS. Currently on 2LPM O2 via [place].

## 2015-03-06 MED ORDER — ALVIMOPAN 12 MG PO CAPS
12.0000 mg | ORAL_CAPSULE | Freq: Two times a day (BID) | ORAL | Status: DC
  Administered 2015-03-07 – 2015-03-10 (×7): 12 mg via ORAL
  Filled 2015-03-06 (×7): qty 1

## 2015-03-06 NOTE — Progress Notes (Signed)
Notified Dr. Leanora Cover of BG of 171 with instructions from order to notify MD for BG greater than 140. No new orders received.

## 2015-03-06 NOTE — Progress Notes (Signed)
2 Days Post-Op   Subjective:  Her pain is markedly improved. She now only has very mild right lower quadrant pain when she tries to get up. Although she only has minimal nausea, her anorexia is severe. She is not passing flatus.  Vital signs in last 24 hours: Temp:  [97.6 F (36.4 C)-98.2 F (36.8 C)] 97.7 F (36.5 C) (05/07 0812) Pulse Rate:  [81-95] 86 (05/07 0812) Resp:  [16-18] 17 (05/07 0812) BP: (105-131)/(46-71) 131/62 mmHg (05/07 0812) SpO2:  [88 %-95 %] 88 % (05/07 0812) Weight:  [81.846 kg (180 lb 7 oz)-82.555 kg (182 lb)] 82.555 kg (182 lb) (05/07 0428) Last BM Date: 03/03/15  Intake/Output from previous day: 05/06 0701 - 05/07 0700 In: 3527.5 [I.V.:3377.5; IV Piggyback:150] Out: 1950 [Urine:1950]  GI: soft, non-tender; bowel sounds normal; no masses,  no organomegaly. Mild to moderate distention with tympany.  Lab Results:  CBC  Recent Labs  03/04/15 1119  WBC 11.7*  HGB 12.1  HCT 36.5  PLT 209   CMP     Component Value Date/Time   NA 135 03/04/2015 1119   NA 138 06/26/2012 1409   K 4.5 03/04/2015 1119   K 4.3 06/26/2012 1409   CL 103 03/04/2015 1119   CL 105 06/26/2012 1409   CO2 24 03/04/2015 1119   CO2 26 06/26/2012 1409   GLUCOSE 171* 03/04/2015 1119   GLUCOSE 119* 06/26/2012 1409   BUN 15 03/04/2015 1119   BUN 18 06/26/2012 1409   CREATININE 1.10* 03/04/2015 1119   CREATININE 1.04 06/26/2012 1409   CALCIUM 10.3 03/04/2015 1119   CALCIUM 10.6* 06/26/2012 1409   PROT 7.7 03/04/2015 1119   PROT 8.2 06/26/2012 1409   ALBUMIN 3.2* 03/04/2015 1119   ALBUMIN 4.1 06/26/2012 1409   AST 22 03/04/2015 1119   AST 39* 06/26/2012 1409   ALT 22 03/04/2015 1119   ALT 51 06/26/2012 1409   ALKPHOS 128* 03/04/2015 1119   ALKPHOS 126 06/26/2012 1409   BILITOT 0.6 03/04/2015 1119   GFRNONAA 50* 03/04/2015 1119   GFRNONAA 56* 06/26/2012 1409   GFRAA 58* 03/04/2015 1119   GFRAA >60 06/26/2012 1409   PT/INR No results for input(s): LABPROT, INR in the  last 72 hours.  Studies/Results: Ct Abdomen Pelvis W Contrast  03/04/2015   CLINICAL DATA:  69 year old female with abdominal pain for 5 days. Initial encounter.  EXAM: CT ABDOMEN AND PELVIS WITH CONTRAST  TECHNIQUE: Multidetector CT imaging of the abdomen and pelvis was performed using the standard protocol following bolus administration of intravenous contrast.  CONTRAST:  118mL OMNIPAQUE IOHEXOL 300 MG/ML  SOLN  COMPARISON:  Abdominal series 1141 hr today. CT Abdomen and Pelvis 10/08/2009.  FINDINGS: Increased platelike atelectasis along the major fissures today, but otherwise improved lung base ventilation compared to 2010. No pericardial or pleural effusion.  No acute osseous abnormality identified.  No pelvic free fluid. Negative rectum. Negative uterus and adnexa. Diminutive bladder.  Mild sigmoid diverticulosis, no definite active inflammation. Negative left colon. Negative transverse colon.  Oral contrast has reached the hepatic flexure. There is confluent inflammation in the right lower quadrant affecting both the cecum and distal small bowel, but epicenter of the inflammation is at the expected origin of the appendix. Today no normal appendix is identified, but rather there is an abnormal elongated fluid collection measuring up to 16 mm diameter and 5 cm in length (see coronal image 73) with adjacent probable thickened and inflamed appendix, containing no gas or contrast. See series  2, image 62. Associated large and small bowel mesenteric stranding in this region. No bowel obstruction.  No dilated small bowel. Negative stomach and duodenum. No abdominal free fluid. Liver, spleen, pancreas and adrenal glands are within normal limits. Chronic cholelithiasis. Stable biliary tree. No abdominal free fluid or free air. Renal enhancement and contrast excretion within normal limits. Portal venous system is patent. Aortoiliac calcified atherosclerosis noted. Major arterial structures are patent. There is  ectasia of the infrarenal abdominal aorta measuring up to 25 mm diameter. No lymphadenopathy.  IMPRESSION: 1. Moderate to severe right lower quadrant inflammation favored due to acute appendicitis. An elongated 5 cm x 1.6 cm fluid collection is suspicious for abscess along the inflamed appendix. See coronal image 73. No free fluid elsewhere and no free air. 2. Secondary inflammation of the cecum and distal small bowel. No bowel obstruction. 3. 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 Recommend followup by ultrasound in 5 years. This recommendation follows ACR consensus guidelines: White Paper of the ACR Incidental Findings Committee II on Vascular Findings. J Am Coll Radiol 2013; 10:789-794.   Electronically Signed   By: Genevie Ann M.D.   On: 03/04/2015 15:12   Dg Abd 2 Views  03/04/2015   CLINICAL DATA:  Right lower quadrant pain lie now with bilateral lower quadrant pain for several days and nausea  EXAM: ABDOMEN - 2 VIEW  COMPARISON:  CT abdomen pelvis of 10/08/2009  FINDINGS: Supine and erect views of the abdomen show no bowel obstruction. No free air is seen. There are calcifications in the right upper quadrant which correlating with CT probably represent small gallstones. No definite calcifications are seen overlying the renal outlines. No bony abnormality is seen. Chronic change at the lung bases remains.  IMPRESSION: No bowel obstruction.  No free air.  Probable small gallstones.   Electronically Signed   By: Ivar Drape M.D.   On: 03/04/2015 12:02    Assessment/Plan: Postoperative ileus. Entereg.

## 2015-03-06 NOTE — Progress Notes (Signed)
Pt. Noted that she felt tired as the night progressed, dyspnic when up to BR. Pt. On O2 2L, fluids at 150 mls/hr, SAT's in the high 80's with ambulation. MD called and fluids decreased to 50 mls/hr. Pt. Noted that she felt better and not as tired.

## 2015-03-07 MED ORDER — CEFTRIAXONE SODIUM IN DEXTROSE 20 MG/ML IV SOLN
1.0000 g | INTRAVENOUS | Status: DC
  Administered 2015-03-07 – 2015-03-10 (×4): 1 g via INTRAVENOUS
  Filled 2015-03-07 (×5): qty 50

## 2015-03-07 NOTE — Discharge Summary (Signed)
Patient ID: Patricia Lawson MRN: [id number] DOB/AGE: 05/02/1946 69 y.o.  Admit date: 03/04/2015 Discharge date: 03/07/2015  Discharge Diagnoses:  Acute appendicitis  Procedures Performed: Laparoscopic appendectomy  Discharged Condition: good  Hospital Course: The patient underwent the above-mentioned procedure. The above-mentioned diagnosis and after a day or 2 of ileus her bowel function recovered and she was tolerating a regular diet and was afebrile.  Discharge Orders:   Disposition: 01-Home or Self Care  Discharge Medications:  Current facility-administered medications:  .  0.9 %  sodium chloride infusion, , Intravenous, Continuous, Marlyce Huge, MD, Last Rate: 50 mL/hr at 03/06/15 2326 .  acetaminophen (TYLENOL) tablet 650 mg, 650 mg, Oral, Q6H PRN **OR** acetaminophen (TYLENOL) suppository 650 mg, 650 mg, Rectal, Q6H PRN, Molly Maduro, MD .  alvimopan (ENTEREG) capsule 12 mg, 12 mg, Oral, BID, Molly Maduro, MD, 12 mg at 03/07/15 0944 .  atorvastatin (LIPITOR) tablet 80 mg, 80 mg, Oral, Daily, Sherri Rad, MD, 80 mg at 03/07/15 0944 .  cefTRIAXone (ROCEPHIN) 1 g in dextrose 5 % 50 mL IVPB - Premix, 1 g, Intravenous, Q24H, Molly Maduro, MD, 1 g at 03/07/15 0944 .  clindamycin (CLEOCIN) IVPB 600 mg, 600 mg, Intravenous, 3 times per day, Sherri Rad, MD, 600 mg at 03/07/15 0319 .  heparin injection 5,000 Units, 5,000 Units, Subcutaneous, 3 times per day, Sherri Rad, MD, 5,000 Units at 03/07/15 0603 .  HYDROcodone-acetaminophen (NORCO/VICODIN) 5-325 MG per tablet 1 tablet, 1 tablet, Oral, Q6H PRN, Sherri Rad, MD .  metoprolol succinate (TOPROL-XL) 24 hr tablet 100 mg, 100 mg, Oral, Daily, Sherri Rad, MD, 100 mg at 03/07/15 0944 .  morphine 2 MG/ML injection 1 mg, 1 mg, Intravenous, Q2H PRN, Sherri Rad, MD, 1 mg at 03/05/15 1050 .  promethazine (PHENERGAN) injection 12.5 mg, 12.5 mg, Intravenous, Q3H PRN, Molly Maduro, MD, 12.5 mg at 03/04/15  1806  Follwup:   Signed: Consuela Mimes 03/07/2015, 11:57 AM

## 2015-03-07 NOTE — Progress Notes (Signed)
3 Days Post-Op   Subjective:  Stomach growling. Hungry. Passing flatus.  Vital signs in last 24 hours: Temp:  [97.8 F (36.6 C)-99 F (37.2 C)] 98.7 F (37.1 C) (05/08 0808) Pulse Rate:  [81-84] 81 (05/08 0808) Resp:  [19-22] 19 (05/08 0808) BP: (116-143)/(61-68) 143/67 mmHg (05/08 0808) SpO2:  [90 %-94 %] 92 % (05/08 0808) Weight:  [78.2 kg (172 lb 6.4 oz)] 78.2 kg (172 lb 6.4 oz) (05/08 0700) Last BM Date: 03/03/15  Intake/Output from previous day: 05/07 0701 - 05/08 0700 In: 565 [I.V.:530; IV Piggyback:35] Out: 500 [Urine:500]  GI: soft, non-tender; bowel sounds normal; no masses,  no organomegaly  Lab Results:  CBC No results for input(s): WBC, HGB, HCT, PLT in the last 72 hours. CMP     Component Value Date/Time   NA 135 03/04/2015 1119   NA 138 06/26/2012 1409   K 4.5 03/04/2015 1119   K 4.3 06/26/2012 1409   CL 103 03/04/2015 1119   CL 105 06/26/2012 1409   CO2 24 03/04/2015 1119   CO2 26 06/26/2012 1409   GLUCOSE 171* 03/04/2015 1119   GLUCOSE 119* 06/26/2012 1409   BUN 15 03/04/2015 1119   BUN 18 06/26/2012 1409   CREATININE 1.10* 03/04/2015 1119   CREATININE 1.04 06/26/2012 1409   CALCIUM 10.3 03/04/2015 1119   CALCIUM 10.6* 06/26/2012 1409   PROT 7.7 03/04/2015 1119   PROT 8.2 06/26/2012 1409   ALBUMIN 3.2* 03/04/2015 1119   ALBUMIN 4.1 06/26/2012 1409   AST 22 03/04/2015 1119   AST 39* 06/26/2012 1409   ALT 22 03/04/2015 1119   ALT 51 06/26/2012 1409   ALKPHOS 128* 03/04/2015 1119   ALKPHOS 126 06/26/2012 1409   BILITOT 0.6 03/04/2015 1119   GFRNONAA 50* 03/04/2015 1119   GFRNONAA 56* 06/26/2012 1409   GFRAA 58* 03/04/2015 1119   GFRAA >60 06/26/2012 1409   PT/INR No results for input(s): LABPROT, INR in the last 72 hours.  Studies/Results: No results found.  Assessment/Plan: Regular diet. Discharge tomorrow.

## 2015-03-08 NOTE — Progress Notes (Signed)
Order for abdominal binder per Dr. Burt Knack

## 2015-03-08 NOTE — Progress Notes (Signed)
4 Days Post-Op  Subjective: Patient is postop date 3 following a ruptured appendix. She complains of increased abdominal pain today is tolerating a regular diet but not eating much. I denies nausea or vomiting. Is concerned that no one is looked at her dressings yet.  Objective: Vital signs in last 24 hours: Temp:  [97.5 F (36.4 C)-98.7 F (37.1 C)] 98.3 F (36.8 C) (05/09 0807) Pulse Rate:  [81-83] 81 (05/09 0807) Resp:  [16-18] 18 (05/09 0807) BP: (122-138)/(65-74) 132/65 mmHg (05/09 1043) SpO2:  [90 %-94 %] 92 % (05/09 0808) Weight:  [79.878 kg (176 lb 1.6 oz)] 79.878 kg (176 lb 1.6 oz) (05/09 0500) Last BM Date: 03/03/15  Intake/Output from previous day: 05/08 0701 - 05/09 0700 In: 3409 [P.O.:1080; I.V.:2279; IV Piggyback:50] Out: 332 [Urine:775] Intake/Output this shift: Total I/O In: 93 [I.V.:93] Out: 400 [Urine:400]  Physical exam patient appears quite comfortable. Her abdominal exam is benign but there is old blood beneath the impermeable dressings. The impermeable dressings are removed there is no further bleeding or drainage noted and no erythema or purulence noted. Abdomen is otherwise soft and nontender    Lab Results: CBC  No results for input(s): WBC, HGB, HCT, PLT in the last 72 hours. BMET No results for input(s): NA, K, CL, CO2, GLUCOSE, BUN, CREATININE, CALCIUM in the last 72 hours. PT/INR No results for input(s): LABPROT, INR in the last 72 hours. ABG No results for input(s): PHART, HCO3 in the last 72 hours.  Invalid input(s): PCO2, PO2  Studies/Results: No results found.  Anti-infectives: Anti-infectives    Start     Dose/Rate Route Frequency Ordered Stop   03/07/15 1000  cefTRIAXone (ROCEPHIN) 1 g in dextrose 5 % 50 mL IVPB - Premix     1 g 100 mL/hr over 30 Minutes Intravenous Every 24 hours 03/07/15 0922     03/06/15 1000  cefTRIAXone (ROCEPHIN) 1 g in dextrose 5 % 50 mL IVPB - Premix  Status:  Discontinued     1 g 100 mL/hr over 30  Minutes Intravenous Every 24 hours 03/05/15 0042 03/07/15 0922   03/05/15 0045  cefTRIAXone (ROCEPHIN) 1 g in dextrose 5 % 50 mL IVPB - Premix     1 g 100 mL/hr over 30 Minutes Intravenous  Once 03/05/15 0042 03/05/15 0303   03/05/15 0027  cefTRIAXone (ROCEPHIN) 1 g in dextrose 5 % 50 mL IVPB - Premix  Status:  Discontinued     1 g 100 mL/hr over 30 Minutes Intravenous Every 24 hours 03/05/15 0027 03/05/15 0040   03/05/15 0027  clindamycin (CLEOCIN) IVPB 600 mg     600 mg 100 mL/hr over 30 Minutes Intravenous 3 times per day 03/05/15 0027     03/04/15 1930  cefTRIAXone (ROCEPHIN) 1 g in dextrose 5 % 50 mL IVPB - Premix  Status:  Discontinued     1 g 100 mL/hr over 30 Minutes Intravenous  Once 03/04/15 1925 03/05/15 0027   03/04/15 1745  clindamycin (CLEOCIN) IVPB 600 mg  Status:  Discontinued     600 mg 100 mL/hr over 30 Minutes Intravenous  Once 03/04/15 1740 03/05/15 0027   03/04/15 1715  cefTRIAXone (ROCEPHIN) 1 g in dextrose 5 % 50 mL IVPB     1 g 100 mL/hr over 30 Minutes Intravenous  Once 03/04/15 1709 03/04/15 1808   03/04/15 1715  clindamycin (CLEOCIN) IVPB 600 mg     600 mg 100 mL/hr over 30 Minutes Intravenous  Once 03/04/15 1709 03/04/15  1926      Assessment/Plan: s/p Procedure(s): APPENDECTOMY LAPAROSCOPIC   Patient is doing very well. Recommend continued dressing changes and observation for any further drainage of which none is noted at this time. She should be able to go home tomorrow on oral antibiotics if she feels well.Florene Glen, MD, FACS  03/08/2015

## 2015-03-09 ENCOUNTER — Encounter: Payer: Self-pay | Admitting: Surgery

## 2015-03-09 LAB — SURGICAL PATHOLOGY

## 2015-03-09 NOTE — Progress Notes (Signed)
5 Days Post-Op  Subjective: Patient states her pain is present but minimal. She is having trouble eating. While she has no nausea. She is having poor appetite and has had minimal by mouth intake.  Objective: Vital signs in last 24 hours: Temp:  [97.8 F (36.6 C)-98.5 F (36.9 C)] 97.8 F (36.6 C) (05/10 0810) Pulse Rate:  [83-88] 83 (05/10 0810) Resp:  [17-19] 19 (05/10 0810) BP: (100-150)/(47-83) 129/83 mmHg (05/10 0929) SpO2:  [89 %-93 %] 91 % (05/10 0810) Weight:  [79.47 kg (175 lb 3.2 oz)] 79.47 kg (175 lb 3.2 oz) (05/10 0620) Last BM Date: 03/08/15  Intake/Output from previous day: 05/09 0701 - 05/10 0700 In: 1729.3 [P.O.:480; I.V.:1249.3] Out: 1100 [Urine:1100] Intake/Output this shift: Total I/O In: 0  Out: 350 [Urine:350]  Physical exam:  Abdomen is soft and nontender. Wound shows no further drainage but minimal ecchymosis at the umbilical site. Sutures are intact. No erythema and no purulence.  Calves are nontender  Lab Results: CBC  No results for input(s): WBC, HGB, HCT, PLT in the last 72 hours. BMET No results for input(s): NA, K, CL, CO2, GLUCOSE, BUN, CREATININE, CALCIUM in the last 72 hours. PT/INR No results for input(s): LABPROT, INR in the last 72 hours. ABG No results for input(s): PHART, HCO3 in the last 72 hours.  Invalid input(s): PCO2, PO2  Studies/Results: No results found.  Anti-infectives: Anti-infectives    Start     Dose/Rate Route Frequency Ordered Stop   03/07/15 1000  cefTRIAXone (ROCEPHIN) 1 g in dextrose 5 % 50 mL IVPB - Premix     1 g 100 mL/hr over 30 Minutes Intravenous Every 24 hours 03/07/15 0922     03/06/15 1000  cefTRIAXone (ROCEPHIN) 1 g in dextrose 5 % 50 mL IVPB - Premix  Status:  Discontinued     1 g 100 mL/hr over 30 Minutes Intravenous Every 24 hours 03/05/15 0042 03/07/15 0922   03/05/15 0045  cefTRIAXone (ROCEPHIN) 1 g in dextrose 5 % 50 mL IVPB - Premix     1 g 100 mL/hr over 30 Minutes Intravenous  Once  03/05/15 0042 03/05/15 0303   03/05/15 0027  cefTRIAXone (ROCEPHIN) 1 g in dextrose 5 % 50 mL IVPB - Premix  Status:  Discontinued     1 g 100 mL/hr over 30 Minutes Intravenous Every 24 hours 03/05/15 0027 03/05/15 0040   03/05/15 0027  clindamycin (CLEOCIN) IVPB 600 mg     600 mg 100 mL/hr over 30 Minutes Intravenous 3 times per day 03/05/15 0027     03/04/15 1930  cefTRIAXone (ROCEPHIN) 1 g in dextrose 5 % 50 mL IVPB - Premix  Status:  Discontinued     1 g 100 mL/hr over 30 Minutes Intravenous  Once 03/04/15 1925 03/05/15 0027   03/04/15 1745  clindamycin (CLEOCIN) IVPB 600 mg  Status:  Discontinued     600 mg 100 mL/hr over 30 Minutes Intravenous  Once 03/04/15 1740 03/05/15 0027   03/04/15 1715  cefTRIAXone (ROCEPHIN) 1 g in dextrose 5 % 50 mL IVPB     1 g 100 mL/hr over 30 Minutes Intravenous  Once 03/04/15 1709 03/04/15 1808   03/04/15 1715  clindamycin (CLEOCIN) IVPB 600 mg     600 mg 100 mL/hr over 30 Minutes Intravenous  Once 03/04/15 1709 03/04/15 1926      Assessment/Plan: s/p Procedure(s): APPENDECTOMY LAPAROSCOPIC   Patient is status post laparoscopic appendectomy for ruptured appendix. My recommendations are to continue IV  antibiotics as she is not ready for discharge due to poor appetite and the condition of her appendix at surgery.Florene Glen, MD, FACS  03/09/2015

## 2015-03-10 LAB — C DIFFICILE QUICK SCREEN W PCR REFLEX
C DIFFICILE (CDIFF) TOXIN: NEGATIVE
C DIFFICLE (CDIFF) ANTIGEN: NEGATIVE
C Diff interpretation: NEGATIVE

## 2015-03-10 MED ORDER — CEPHALEXIN 500 MG PO CAPS: 500.0000 mg | ORAL_CAPSULE | Freq: Four times a day (QID) | ORAL | Status: DC

## 2015-03-10 MED ORDER — METRONIDAZOLE 500 MG PO TABS: 500.0000 mg | ORAL_TABLET | Freq: Three times a day (TID) | ORAL | Status: DC

## 2015-03-10 MED ORDER — HYDROCODONE-ACETAMINOPHEN 5-325 MG PO TABS: 1.0000 | ORAL_TABLET | Freq: Four times a day (QID) | ORAL | Status: DC | PRN

## 2015-03-10 NOTE — Discharge Summary (Signed)
Physician Discharge Summary  Patient ID: Patricia Lawson MRN: [id number] DOB/AGE: 1946-08-03 69 y.o.  Admit date: 03/04/2015 Discharge date: 03/10/2015   Discharge Diagnoses:  Active Problems:   Appendicitis, acute   Discharged Condition: Stable  Procedures: Laparoscopic appendectomy  Hospital Course: Patient came to the hospital and had a laparoscopic appendectomy for signs of acute appendicitis. It was acutely suppurative. She was treated with IV antibiotics. Is currently tolerating a regular diet. She is afebrile and doing very well. She is discharged in stable condition to follow-up with Dr. Leanora Cover in 10 days instructions on showering or given to the patient as well as wound care.    Significant Diagnostic Studies: ct   Disposition: 01-Home or Self Care     Medication List    TAKE these medications        aspirin 81 MG tablet  Take 81 mg by mouth daily.     atorvastatin 80 MG tablet  Commonly known as:  LIPITOR  Take 80 mg by mouth daily.     cephALEXin 500 MG capsule  Commonly known as:  KEFLEX  Take 1 capsule (500 mg total) by mouth 4 (four) times daily.     HYDROcodone-acetaminophen 5-325 MG per tablet  Commonly known as:  NORCO/VICODIN  Take 1 tablet by mouth every 6 (six) hours as needed for moderate pain.     irbesartan 75 MG tablet  Commonly known as:  AVAPRO  Take 75 mg by mouth daily.     metoprolol succinate 100 MG 24 hr tablet  Commonly known as:  TOPROL-XL  Take 100 mg by mouth daily.     metroNIDAZOLE 500 MG tablet  Commonly known as:  FLAGYL  Take 1 tablet (500 mg total) by mouth 3 (three) times daily.           Follow-up Information    Follow up with Consuela Mimes, MD In 10 days.   Specialty:  Surgery   Why:  For suture removal   Contact information:   [place] [address] [place] [address] [phone]       Patricia Glen, MD, FACS

## 2015-03-10 NOTE — Progress Notes (Signed)
Patient from home and independent. States no needs drives self and has family support. Ambulates well.

## 2015-03-10 NOTE — Progress Notes (Signed)
6 Days Post-Op  Subjective: Patient feels better today she is tolerating more of her diet. Her pain is well controlled. She has no nausea or vomiting.  Objective: Vital signs in last 24 hours: Temp:  [97.5 F (36.4 C)-98.2 F (36.8 C)] 98.2 F (36.8 C) (05/11 0851) Pulse Rate:  [85-86] 86 (05/11 0851) Resp:  [18-22] 18 (05/11 0851) BP: (110-145)/(59-69) 145/69 mmHg (05/11 0851) SpO2:  [93 %-95 %] 93 % (05/11 0851) Weight:  [79.107 kg (174 lb 6.4 oz)] 79.107 kg (174 lb 6.4 oz) (05/11 0500) Last BM Date: 03/09/15  Intake/Output from previous day: 05/10 0701 - 05/11 0700 In: 1600 [P.O.:240; I.V.:1160; IV Piggyback:200] Out: 2075 [Urine:2075] Intake/Output this shift: Total I/O In: -  Out: 350 [Urine:350]  Physical exam:  Wounds healing well. No erythema or drainage. Sutures are in place. Moderate ecchymosis. Nontender calves.  Lab Results: CBC  No results for input(s): WBC, HGB, HCT, PLT in the last 72 hours. BMET No results for input(s): NA, K, CL, CO2, GLUCOSE, BUN, CREATININE, CALCIUM in the last 72 hours. PT/INR No results for input(s): LABPROT, INR in the last 72 hours. ABG No results for input(s): PHART, HCO3 in the last 72 hours.  Invalid input(s): PCO2, PO2  Studies/Results: No results found.  Anti-infectives: Anti-infectives    Start     Dose/Rate Route Frequency Ordered Stop   03/07/15 1000  cefTRIAXone (ROCEPHIN) 1 g in dextrose 5 % 50 mL IVPB - Premix     1 g 100 mL/hr over 30 Minutes Intravenous Every 24 hours 03/07/15 0922     03/06/15 1000  cefTRIAXone (ROCEPHIN) 1 g in dextrose 5 % 50 mL IVPB - Premix  Status:  Discontinued     1 g 100 mL/hr over 30 Minutes Intravenous Every 24 hours 03/05/15 0042 03/07/15 0922   03/05/15 0045  cefTRIAXone (ROCEPHIN) 1 g in dextrose 5 % 50 mL IVPB - Premix     1 g 100 mL/hr over 30 Minutes Intravenous  Once 03/05/15 0042 03/05/15 0303   03/05/15 0027  cefTRIAXone (ROCEPHIN) 1 g in dextrose 5 % 50 mL IVPB - Premix   Status:  Discontinued     1 g 100 mL/hr over 30 Minutes Intravenous Every 24 hours 03/05/15 0027 03/05/15 0040   03/05/15 0027  clindamycin (CLEOCIN) IVPB 600 mg     600 mg 100 mL/hr over 30 Minutes Intravenous 3 times per day 03/05/15 0027     03/04/15 1930  cefTRIAXone (ROCEPHIN) 1 g in dextrose 5 % 50 mL IVPB - Premix  Status:  Discontinued     1 g 100 mL/hr over 30 Minutes Intravenous  Once 03/04/15 1925 03/05/15 0027   03/04/15 1745  clindamycin (CLEOCIN) IVPB 600 mg  Status:  Discontinued     600 mg 100 mL/hr over 30 Minutes Intravenous  Once 03/04/15 1740 03/05/15 0027   03/04/15 1715  cefTRIAXone (ROCEPHIN) 1 g in dextrose 5 % 50 mL IVPB     1 g 100 mL/hr over 30 Minutes Intravenous  Once 03/04/15 1709 03/04/15 1808   03/04/15 1715  clindamycin (CLEOCIN) IVPB 600 mg     600 mg 100 mL/hr over 30 Minutes Intravenous  Once 03/04/15 1709 03/04/15 1926      Assessment/Plan: s/p Procedure(s): APPENDECTOMY LAPAROSCOPIC   Patient is doing very well and has improved considerably today my plan would be to discharge the patient on oral antibiotics. She will follow up with Dr. Leanora Cover in 10 days. Instructions were given concerning  wound care and showering.Florene Glen, MD, FACS  03/10/2015

## 2015-03-10 NOTE — Progress Notes (Signed)
Pt discharged to home. Instructions and education provided. IV removed. Questions answered. Pt will be escorted out by volunteer when family arrives.

## 2015-03-10 NOTE — Op Note (Signed)
03/04/2015 - 03/05/2015  9:22 AM  PATIENT:  Patricia Lawson  69 y.o. female  PRE-OPERATIVE DIAGNOSIS:  acute appendicitis with rupture and localized intraabdominal pus  POST-OPERATIVE DIAGNOSIS:  same as preop  PROCEDURE:  Procedure(s): APPENDECTOMY LAPAROSCOPIC (N/A)  SURGEON:  Surgeon(s) and Role:    * Sherri Rad, MD - Primary  ANESTHESIA:   General  SPECIMEN:  appendix  DICTATION: The patient in the supine position general endotracheal anesthesia was induced.  Patient's abdomen was sterilely prepped and draped with Chlora-Prep solution.  A timeout was observed. A 12 mm blunt trocar was placed through an open technique through an infraumbilical transverse skin incision.  Pneumoperitoneum was established.  A 12 mm bladeless trocar was placed in the right upper quadrant.  A 5 mm blunt trocar was placed in the left lower quadrant.  The right lower quadrant was manipulated.  A perforated appendix was identified.  Small amount of thick pus was aspirated.  The appendix was elevated towards the anterior abdominal wall.  The mesoappendix was divided with harmonic scalpel apparatus.  The confluence of the tinea was identified.  Utilizing an endoscopic 59DJ blue load application of the stapler The base of the appendix was transected.  It was captured in an Endo Catch device and retrieved.  The right quadrant was irrigated with a total of 2 L of warm normal saline and aspirated dry.  Hemostasis appeared adequate on the operative field ports were then removed under direct visualization.  The infraumbilical fascial defect was re approximated with an additional figure-of-eight #0 Vicryl suture vertical orientation.  A total of 30 cc of 0.25% plain Marcaine was infiltrated along all skin and fascial incisions prior to closure.    4-0 Vicryl suture was used to reapproximate skin edges followed by Steri-Strips,Telfa and Tegaderm.  She was taken to the recovery room in stable and satisfactory condition by  anesthesia services.

## 2015-03-10 NOTE — Discharge Instructions (Signed)
Diet as tolerated  Patient may shower but not bathe.  Sutures will be removed at an office visit with Dr. Leanora Cover in 10 days.  patient is discharged on oral antibiotics and oral analgesics.

## 2015-03-12 ENCOUNTER — Encounter: Payer: Self-pay | Admitting: Surgery

## 2015-04-09 ENCOUNTER — Telehealth: Payer: Self-pay | Admitting: *Deleted

## 2015-04-09 NOTE — Telephone Encounter (Signed)
Patient called today with concerns about having continued diarrhea and loss of appetite 6 weeks after her laparoscopic appendectomy. Patient reported no fever and occasional  minor pain when she changes position.  I directed the patient to go immediately to her PCP, since we do not have a surgeon in clinic until June 21 st, for evaluation of her symptoms. Patient confirmed understanding of directions and information.

## 2015-08-17 ENCOUNTER — Ambulatory Visit
Admission: EM | Admit: 2015-08-17 | Discharge: 2015-08-17 | Disposition: A | Discharge status: Home / Self Care | Payer: Medicare Other | Attending: Family Medicine | Admitting: Family Medicine

## 2015-08-17 ENCOUNTER — Encounter: Payer: Self-pay | Admitting: Emergency Medicine

## 2015-08-17 ENCOUNTER — Ambulatory Visit: Payer: Medicare Other

## 2015-08-17 DIAGNOSIS — I10 Essential (primary) hypertension: Secondary | ICD-10-CM | POA: Diagnosis not present

## 2015-08-17 DIAGNOSIS — W458XXA Other foreign body or object entering through skin, initial encounter: Secondary | ICD-10-CM | POA: Diagnosis not present

## 2015-08-17 DIAGNOSIS — I2510 Atherosclerotic heart disease of native coronary artery without angina pectoris: Secondary | ICD-10-CM | POA: Diagnosis not present

## 2015-08-17 DIAGNOSIS — S61210A Laceration without foreign body of right index finger without damage to nail, initial encounter: Secondary | ICD-10-CM | POA: Diagnosis not present

## 2015-08-17 DIAGNOSIS — S60121A Contusion of right index finger with damage to nail, initial encounter: Secondary | ICD-10-CM

## 2015-08-17 DIAGNOSIS — S62639B Displaced fracture of distal phalanx of unspecified finger, initial encounter for open fracture: Secondary | ICD-10-CM

## 2015-08-17 DIAGNOSIS — S62631A Displaced fracture of distal phalanx of left index finger, initial encounter for closed fracture: Secondary | ICD-10-CM | POA: Diagnosis not present

## 2015-08-17 DIAGNOSIS — S62630B Displaced fracture of distal phalanx of right index finger, initial encounter for open fracture: Secondary | ICD-10-CM | POA: Diagnosis not present

## 2015-08-17 MED ORDER — TETANUS-DIPHTH-ACELL PERTUSSIS 5-2.5-18.5 LF-MCG/0.5 IM SUSP
0.5000 mL | Freq: Once | INTRAMUSCULAR | Status: AC
  Administered 2015-08-17: 0.5 mL via INTRAMUSCULAR

## 2015-08-17 MED ORDER — BACITRACIN ZINC 500 UNIT/GM EX OINT
TOPICAL_OINTMENT | Freq: Once | CUTANEOUS | Status: AC
  Administered 2015-08-17: 1 via TOPICAL

## 2015-08-17 MED ORDER — CEPHALEXIN 500 MG PO CAPS: 500.0000 mg | ORAL_CAPSULE | Freq: Four times a day (QID) | ORAL | Status: DC

## 2015-08-17 MED ORDER — LIDOCAINE HCL (PF) 1 % IJ SOLN: 5.0000 mL | Freq: Once | INTRAMUSCULAR | Status: DC

## 2015-08-17 NOTE — Discharge Instructions (Signed)
Take medication as prescribed. Elevate. Apply ice. Keep clean and dry. Clean daily with soap and water, rinse, pat dry then apply thin layer of topical antibiotic ointment such as neosporin. Keep splint in place.  Follow up with orthopedic as discussed. See above to call to schedule. Return to Urgent care in 7-10 days for suture removal. Return to Urgent care sooner for redness, swelling, increased pain, new or worsening concerns.   Laceration Care, Adult A laceration is a cut that goes through all of the layers of the skin and into the tissue that is right under the skin. Some lacerations heal on their own. Others need to be closed with stitches (sutures), staples, skin adhesive strips, or skin glue. Proper laceration care minimizes the risk of infection and helps the laceration to heal better. HOW TO CARE FOR YOUR LACERATION If sutures or staples were used:  Keep the wound clean and dry.  If you were given a bandage (dressing), you should change it at least one time per day or as told by your health care provider. You should also change it if it becomes wet or dirty.  Keep the wound completely dry for the first 24 hours or as told by your health care provider. After that time, you may shower or bathe. However, make sure that the wound is not soaked in water until after the sutures or staples have been removed.  Clean the wound one time each day or as told by your health care provider:  Wash the wound with soap and water.  Rinse the wound with water to remove all soap.  Pat the wound dry with a clean towel. Do not rub the wound.  After cleaning the wound, apply a thin layer of antibiotic ointmentas told by your health care provider. This will help to prevent infection and keep the dressing from sticking to the wound.  Have the sutures or staples removed as told by your health care provider. If skin adhesive strips were used:  Keep the wound clean and dry.  If you were given a bandage  (dressing), you should change it at least one time per day or as told by your health care provider. You should also change it if it becomes dirty or wet.  Do not get the skin adhesive strips wet. You may shower or bathe, but be careful to keep the wound dry.  If the wound gets wet, pat it dry with a clean towel. Do not rub the wound.  Skin adhesive strips fall off on their own. You may trim the strips as the wound heals. Do not remove skin adhesive strips that are still stuck to the wound. They will fall off in time. If skin glue was used:  Try to keep the wound dry, but you may briefly wet it in the shower or bath. Do not soak the wound in water, such as by swimming.  After you have showered or bathed, gently pat the wound dry with a clean towel. Do not rub the wound.  Do not do any activities that will make you sweat heavily until the skin glue has fallen off on its own.  Do not apply liquid, cream, or ointment medicine to the wound while the skin glue is in place. Using those may loosen the film before the wound has healed.  If you were given a bandage (dressing), you should change it at least one time per day or as told by your health care provider. You should also  change it if it becomes dirty or wet.  If a dressing is placed over the wound, be careful not to apply tape directly over the skin glue. Doing that may cause the glue to be pulled off before the wound has healed.  Do not pick at the glue. The skin glue usually remains in place for 5-10 days, then it falls off of the skin. General Instructions  Take over-the-counter and prescription medicines only as told by your health care provider.  If you were prescribed an antibiotic medicine or ointment, take or apply it as told by your doctor. Do not stop using it even if your condition improves.  To help prevent scarring, make sure to cover your wound with sunscreen whenever you are outside after stitches are removed, after adhesive  strips are removed, or when glue remains in place and the wound is healed. Make sure to wear a sunscreen of at least 30 SPF.  Do not scratch or pick at the wound.  Keep all follow-up visits as told by your health care provider. This is important.  Check your wound every day for signs of infection. Watch for:  Redness, swelling, or pain.  Fluid, blood, or pus.  Raise (elevate) the injured area above the level of your heart while you are sitting or lying down, if possible. SEEK MEDICAL CARE IF:  You received a tetanus shot and you have swelling, severe pain, redness, or bleeding at the injection site.  You have a fever.  A wound that was closed breaks open.  You notice a bad smell coming from your wound or your dressing.  You notice something coming out of the wound, such as wood or glass.  Your pain is not controlled with medicine.  You have increased redness, swelling, or pain at the site of your wound.  You have fluid, blood, or pus coming from your wound.  You notice a change in the color of your skin near your wound.  You need to change the dressing frequently due to fluid, blood, or pus draining from the wound.  You develop a new rash.  You develop numbness around the wound. SEEK IMMEDIATE MEDICAL CARE IF:  You develop severe swelling around the wound.  Your pain suddenly increases and is severe.  You develop painful lumps near the wound or on skin that is anywhere on your body.  You have a red streak going away from your wound.  The wound is on your hand or foot and you cannot properly move a finger or toe.  The wound is on your hand or foot and you notice that your fingers or toes look pale or bluish.   This information is not intended to replace advice given to you by your health care provider. Make sure you discuss any questions you have with your health care provider.   Document Released: 10/16/2005 Document Revised: 03/02/2015 Document Reviewed:  10/12/2014 Elsevier Interactive Patient Education 2016 [employer]  Subungual Hematoma  A subungual hematoma is a pocket of blood under the fingernail or toenail. The nail may turn blue or feel painful. HOME CARE  Put ice on the injured area.  Put ice in a plastic bag.  Place a towel between your skin and the bag.  Leave the ice on for 15-20 minutes, 03-04 times a day. Do this for the first 1 to 2 days.  Raise (elevate) the injured area to lessen pain and puffiness (swelling).  If you were given a bandage, wear it  for as long as told by your doctor.  If part of your nail falls off, trim the rest of the nail gently.  Only take medicines as told by your doctor. GET HELP RIGHT AWAY IF:  You have redness or puffiness around the nail.  You have yellowish-white fluid (pus) coming from the nail.  Your pain does not get better with medicine.  You have a fever. MAKE SURE YOU:  Understand these instructions.  Will watch your condition.  Will get help right away if you are not doing well or get worse.   This information is not intended to replace advice given to you by your health care provider. Make sure you discuss any questions you have with your health care provider.   Document Released: 01/08/2012 Document Reviewed: 03/03/2015 Elsevier Interactive Patient Education [employer].

## 2015-08-17 NOTE — ED Notes (Signed)
Patient states a window fell on her right index finger cutting the end of her finger

## 2015-08-17 NOTE — ED Provider Notes (Signed)
[facility] Emergency Department Provider Note  ____________________________________________  Time seen: Approximately 1:24 PM  I have reviewed the triage vital signs and the nursing notes.   HISTORY  Chief Complaint Finger Injury   HPI Patricia Lawson is a 69 y.o. female presents for complaint of laceration. Reports just prior to arrival, patient was cleaning a window and reports the latch broke and the window fell down on her right second finger. States no glass break. States mild pain at 3/10. States pain has improved. Denies difficulty or pain with moving finger. Denies hand pain otherwise.  Denies other pain or injury. Denies head injury or loss of consciousness. Unsure of last tetanus immunization.    Past Medical History  Diagnosis Date  . Coronary artery disease   . CHF (congestive heart failure) ([place]) 2010  . Hypertension     Patient Active Problem List   Diagnosis Date Noted  . Appendicitis, acute 03/04/2015    Past Surgical History  Procedure Laterality Date  . Stent place left ureter ([facility] hx)      cardiac  . Thyroidectomy Bilateral   . Laparoscopic appendectomy N/A 03/04/2015    Procedure: APPENDECTOMY LAPAROSCOPIC;  Surgeon: Sherri Rad, MD;  Location: [facility] ORS;  Service: General;  Laterality: N/A;   PCP: [place]  Current Outpatient Rx  Name  Route  Sig  Dispense  Refill  . aspirin 81 MG tablet   Oral   Take 81 mg by mouth daily.         Marland Kitchen atorvastatin (LIPITOR) 80 MG tablet   Oral   Take 80 mg by mouth daily.         .           .           . irbesartan (AVAPRO) 75 MG tablet   Oral   Take 75 mg by mouth daily.         . metoprolol succinate (TOPROL-XL) 100 MG 24 hr tablet   Oral   Take 100 mg by mouth daily.         .             Allergies Review of patient's allergies indicates no known allergies.  Family History  Problem Relation Age of Onset  . Heart block Mother   . Heart block Father     Social  History Social History  Substance Use Topics  . Smoking status: Former Smoker    Quit date: 03/03/2009  . Smokeless tobacco: None  . Alcohol Use: No    Review of Systems Constitutional: No fever/chills Eyes: No visual changes. ENT: No sore throat. Cardiovascular: Denies chest pain. Respiratory: Denies shortness of breath. Gastrointestinal: No abdominal pain.  No nausea, no vomiting.  No diarrhea.  No constipation. Genitourinary: Negative for dysuria. Musculoskeletal: Negative for back pain. Skin: Negative for rash.laceration right distal finger Neurological: Negative for headaches, focal weakness or numbness.  10-point ROS otherwise negative.  ____________________________________________   PHYSICAL EXAM:  VITAL SIGNS: ED Triage Vitals  Enc Vitals Group     BP 08/17/15 1222 127/87 mmHg     Pulse Rate 08/17/15 1222 74     Resp 08/17/15 1222 18     Temp 08/17/15 1222 97.5 F (36.4 C)     Temp Source 08/17/15 1222 Oral     SpO2 08/17/15 1222 97 %     Weight 08/17/15 1222 160 lb (72.576 kg)     Height 08/17/15 1222 5\' 1"  (  1.549 m)     Head Cir --      Peak Flow --      Pain Score 08/17/15 1226 2     Pain Loc --      Pain Edu? --      Excl. in [place]? --     Constitutional: Alert and oriented. Well appearing and in no acute distress. Eyes: Conjunctivae are normal. PERRL. EOMI. Head: Atraumatic.  Nose: No congestion/rhinnorhea.  Mouth/Throat: Mucous membranes are moist.   Neck: No stridor.  No cervical spine tenderness to palpation. Cardiovascular: Normal rate, regular rhythm. Grossly normal heart sounds.  Good peripheral circulation. Respiratory: Normal respiratory effort.  No retractions. Lungs CTAB.Marland Kitchen Musculoskeletal: No lower or upper extremity tenderness nor edema.  No joint effusions. See skin below.  Neurologic:  Normal speech and language. No gross focal neurologic deficits are appreciated. No gait instability. Skin:  Skin is warm, dry and intact. No rash  noted. Except: Right distal medial second finger 3 cm laceration present. Laceration present  laterally adjacent to nail bed and towards distal finger with small subungual hematoma present, No nail bed laceration visualized. Nail intact. Right second finger with full ROM, sensation and motor intact, no tendon deficit, mild TTP. No foreign bodies visualized. Skin otherwise intact.  Psychiatric: Mood and affect are normal. Speech and behavior are normal.  ____________________________________________   LABS (all labs ordered are listed, but only abnormal results are displayed)  Labs Reviewed - No data to display  RADIOLOGY  EXAM: RIGHT INDEX FINGER 2+V  COMPARISON: None.  FINDINGS: There is fracture of the tuft of the distal phalanx of the right second digit with soft tissue swelling. No other abnormality is seen. There is mild degenerative change involving the right second DIP joint.  IMPRESSION: 1. Fracture of the tuft of the distal phalanx of the right second digit. 2. Degenerative change of the right second DIP joint   Electronically Signed By: Ivar Drape M.D. On: 08/17/2015 14:05      I, Marylene Land, personally viewed and evaluated these images (plain radiographs) as part of my medical decision making.   ____________________________________________   PROCEDURES  Procedure(s) performed:  Procedure(s) performed:  Procedure explained and verbal consent obtained. Consent: Verbal consent obtained. Written consent not obtained. Risks and benefits: risks, benefits and alternatives were discussed Patient identity confirmed: verbally with patient and hospital-assigned identification number  Consent given by: patient   Laceration Repair Location:Right distal second finger Length: 3 cm  Foreign bodies: no foreign bodies Tendon involvement: none Nerve involvement: none Preparation: Patient was prepped and draped in the usual sterile fashion. Anesthesia with  1% Lidocaine 3 mls Irrigation solution: saline Irrigation method: jet lavage Amount of cleaning: copious Repaired with 5-0 nylon  Number of sutures: 7 Technique: simple interrupted   Patient tolerate well. Wound well approximated post repair.  Antibiotic ointment and dressing applied.  Wound care instructions provided.  Observe for any signs of infection or other problems.    Subungual hematoma Procedure explained and verbal consent obtained. Consent: Verbal consent obtained. Written consent not obtained. Risks and benefits: risks, benefits and alternatives were discussed Patient identity confirmed: verbally with patient and hospital-assigned identification number  Consent given by: patient   #18 gauge needle utilized and bored small area in medial base of nail and small amount immediate bloody return. Patient tolerated well.   Splint Aluminum finger splint applied by RN. Neurovascular intact post application.    ________________________________________   INITIAL IMPRESSION / ASSESSMENT AND PLAN /  ED COURSE  Pertinent labs & imaging results that were available during my care of the patient were reviewed by me and considered in my medical decision making (see chart for details).  Very well appearing. No acute distress. Presents for right second finger laceration post crush injury. Right second finger xray positive for fracture of the tuft of the distal phalanx of the right second finger, degenerative changes of the right second DIP joint.. Tetanus immunization updated. Wound cleaned and repaired. Patient tolerated the repair well. Full ROM. No tendon deficit.  Subungual hematoma also drained with boreing technique. As injury is open fracture, will start patient on oral cephalexin. Finger also placed in aluminum finger splint. Patient denies need for pain medication. Patient to follow up with orthopedic or PCP closely. Orthopedic on call Dr Roland Rack information given. Sutures to be removed  in 7-10 days. Discussed continue wearing splint and wound cleaning. Elevate.  Discussed follow up with Primary care physician this week. Discussed follow up and return parameters including no resolution or any worsening concerns. Patient verbalized understanding and agreed to plan.   ____________________________________________   FINAL CLINICAL IMPRESSION(S) / ED DIAGNOSES  Final diagnoses:  Open fracture of tuft of distal phalanx of finger, initial encounter  Subungual hematoma of second finger of right hand, initial encounter  Laceration of second finger of right hand, initial encounter       Marylene Land, NP 08/17/15 1912

## 2015-08-31 ENCOUNTER — Ambulatory Visit
Admission: EM | Admit: 2015-08-31 | Discharge: 2015-08-31 | Disposition: A | Discharge status: Home / Self Care | Payer: Medicare Other

## 2015-08-31 NOTE — ED Notes (Signed)
For 7 sutures to be removed from right 2nd finger. Seen here 2 weeks ago. Wound checked by Tamala Ser PA and okay to remove sutures

## 2015-08-31 NOTE — ED Notes (Signed)
7 sutures removed without difficulty to right 2nd finger tip. Wound cleansed before and after and Bandaid applied

## 2017-02-27 DIAGNOSIS — I2510 Atherosclerotic heart disease of native coronary artery without angina pectoris: Secondary | ICD-10-CM | POA: Diagnosis not present

## 2017-02-27 DIAGNOSIS — I208 Other forms of angina pectoris: Secondary | ICD-10-CM | POA: Diagnosis not present

## 2017-02-27 DIAGNOSIS — E782 Mixed hyperlipidemia: Secondary | ICD-10-CM | POA: Diagnosis not present

## 2017-02-27 DIAGNOSIS — Z7689 Persons encountering health services in other specified circumstances: Secondary | ICD-10-CM | POA: Diagnosis not present

## 2017-02-27 DIAGNOSIS — I252 Old myocardial infarction: Secondary | ICD-10-CM | POA: Diagnosis not present

## 2017-02-27 DIAGNOSIS — Z87891 Personal history of nicotine dependence: Secondary | ICD-10-CM | POA: Diagnosis not present

## 2017-02-27 DIAGNOSIS — I10 Essential (primary) hypertension: Secondary | ICD-10-CM | POA: Diagnosis not present

## 2017-03-07 DIAGNOSIS — H268 Other specified cataract: Secondary | ICD-10-CM | POA: Diagnosis not present

## 2017-03-07 DIAGNOSIS — I10 Essential (primary) hypertension: Secondary | ICD-10-CM | POA: Diagnosis not present

## 2017-03-20 DIAGNOSIS — H2513 Age-related nuclear cataract, bilateral: Secondary | ICD-10-CM | POA: Diagnosis not present

## 2017-04-16 DIAGNOSIS — H2511 Age-related nuclear cataract, right eye: Secondary | ICD-10-CM | POA: Diagnosis not present

## 2017-04-16 DIAGNOSIS — H2512 Age-related nuclear cataract, left eye: Secondary | ICD-10-CM | POA: Diagnosis not present

## 2017-04-16 DIAGNOSIS — H2513 Age-related nuclear cataract, bilateral: Secondary | ICD-10-CM | POA: Diagnosis not present

## 2017-04-23 DIAGNOSIS — H2512 Age-related nuclear cataract, left eye: Secondary | ICD-10-CM | POA: Diagnosis not present

## 2018-03-21 DIAGNOSIS — I208 Other forms of angina pectoris: Secondary | ICD-10-CM | POA: Diagnosis not present

## 2018-03-21 DIAGNOSIS — I252 Old myocardial infarction: Secondary | ICD-10-CM | POA: Diagnosis not present

## 2018-03-21 DIAGNOSIS — I2510 Atherosclerotic heart disease of native coronary artery without angina pectoris: Secondary | ICD-10-CM | POA: Diagnosis not present

## 2018-03-21 DIAGNOSIS — Z87891 Personal history of nicotine dependence: Secondary | ICD-10-CM | POA: Diagnosis not present

## 2018-03-21 DIAGNOSIS — I10 Essential (primary) hypertension: Secondary | ICD-10-CM | POA: Diagnosis not present

## 2018-03-21 DIAGNOSIS — R011 Cardiac murmur, unspecified: Secondary | ICD-10-CM | POA: Diagnosis not present

## 2018-03-21 DIAGNOSIS — E782 Mixed hyperlipidemia: Secondary | ICD-10-CM | POA: Diagnosis not present

## 2018-04-05 ENCOUNTER — Emergency Department: Payer: Medicare Other

## 2018-04-05 ENCOUNTER — Inpatient Hospital Stay
Admission: EM | Admit: 2018-04-05 | Discharge: 2018-04-07 | DRG: 517 | Disposition: A | Discharge status: Home Health | Payer: Medicare Other | Attending: Orthopedic Surgery | Admitting: Orthopedic Surgery

## 2018-04-05 ENCOUNTER — Emergency Department
Admit: 2018-04-05 | Discharge: 2018-04-05 | Disposition: A | Discharge status: Home / Self Care | Payer: Medicare Other | Attending: Emergency Medicine | Admitting: Emergency Medicine

## 2018-04-05 ENCOUNTER — Other Ambulatory Visit: Payer: Self-pay

## 2018-04-05 DIAGNOSIS — I2510 Atherosclerotic heart disease of native coronary artery without angina pectoris: Secondary | ICD-10-CM | POA: Diagnosis present

## 2018-04-05 DIAGNOSIS — S82009A Unspecified fracture of unspecified patella, initial encounter for closed fracture: Secondary | ICD-10-CM

## 2018-04-05 DIAGNOSIS — S82041A Displaced comminuted fracture of right patella, initial encounter for closed fracture: Secondary | ICD-10-CM | POA: Diagnosis not present

## 2018-04-05 DIAGNOSIS — I10 Essential (primary) hypertension: Secondary | ICD-10-CM | POA: Diagnosis not present

## 2018-04-05 DIAGNOSIS — Z79899 Other long term (current) drug therapy: Secondary | ICD-10-CM

## 2018-04-05 DIAGNOSIS — Z8249 Family history of ischemic heart disease and other diseases of the circulatory system: Secondary | ICD-10-CM

## 2018-04-05 DIAGNOSIS — E890 Postprocedural hypothyroidism: Secondary | ICD-10-CM | POA: Diagnosis present

## 2018-04-05 DIAGNOSIS — I252 Old myocardial infarction: Secondary | ICD-10-CM | POA: Diagnosis not present

## 2018-04-05 DIAGNOSIS — Z7982 Long term (current) use of aspirin: Secondary | ICD-10-CM

## 2018-04-05 DIAGNOSIS — W19XXXA Unspecified fall, initial encounter: Secondary | ICD-10-CM | POA: Diagnosis not present

## 2018-04-05 DIAGNOSIS — Z87891 Personal history of nicotine dependence: Secondary | ICD-10-CM

## 2018-04-05 DIAGNOSIS — S82001A Unspecified fracture of right patella, initial encounter for closed fracture: Secondary | ICD-10-CM

## 2018-04-05 DIAGNOSIS — Z01810 Encounter for preprocedural cardiovascular examination: Secondary | ICD-10-CM | POA: Diagnosis not present

## 2018-04-05 DIAGNOSIS — Y92002 Bathroom of unspecified non-institutional (private) residence single-family (private) house as the place of occurrence of the external cause: Secondary | ICD-10-CM | POA: Diagnosis not present

## 2018-04-05 DIAGNOSIS — Z01818 Encounter for other preprocedural examination: Secondary | ICD-10-CM | POA: Diagnosis not present

## 2018-04-05 DIAGNOSIS — M25561 Pain in right knee: Secondary | ICD-10-CM | POA: Diagnosis not present

## 2018-04-05 HISTORY — DX: Unspecified fracture of unspecified patella, initial encounter for closed fracture: S82.009A

## 2018-04-05 LAB — TYPE AND SCREEN
ABO/RH(D): A NEG
ANTIBODY SCREEN: NEGATIVE

## 2018-04-05 LAB — CBC WITH DIFFERENTIAL/PLATELET
BASOS ABS: 0.1 10*3/uL (ref 0–0.1)
Basophils Relative: 1 %
Eosinophils Absolute: 0 10*3/uL (ref 0–0.7)
Eosinophils Relative: 0 %
HEMATOCRIT: 38.5 % (ref 35.0–47.0)
HEMOGLOBIN: 13.2 g/dL (ref 12.0–16.0)
Lymphocytes Relative: 23 %
Lymphs Abs: 2.7 10*3/uL (ref 1.0–3.6)
MCH: 31.3 pg (ref 26.0–34.0)
MCHC: 34.3 g/dL (ref 32.0–36.0)
MCV: 91.1 fL (ref 80.0–100.0)
Monocytes Absolute: 0.9 10*3/uL (ref 0.2–0.9)
Monocytes Relative: 8 %
NEUTROS PCT: 68 %
Neutro Abs: 8.2 10*3/uL — ABNORMAL HIGH (ref 1.4–6.5)
Platelets: 199 10*3/uL (ref 150–440)
RBC: 4.23 MIL/uL (ref 3.80–5.20)
RDW: 12.4 % (ref 11.5–14.5)
WBC: 11.9 10*3/uL — ABNORMAL HIGH (ref 3.6–11.0)

## 2018-04-05 LAB — BASIC METABOLIC PANEL
Anion gap: 9 (ref 5–15)
BUN: 15 mg/dL (ref 6–20)
CO2: 26 mmol/L (ref 22–32)
CREATININE: 0.64 mg/dL (ref 0.44–1.00)
Calcium: 10.9 mg/dL — ABNORMAL HIGH (ref 8.9–10.3)
Chloride: 98 mmol/L — ABNORMAL LOW (ref 101–111)
GFR calc Af Amer: >60 mL/min (ref >60)
GFR calc non Af Amer: >60 mL/min (ref >60)
Glucose, Bld: 326 mg/dL — ABNORMAL HIGH (ref 65–99)
Potassium: 4.4 mmol/L (ref 3.5–5.1)
SODIUM: 133 mmol/L — AB (ref 135–145)

## 2018-04-05 LAB — PROTIME-INR
INR: 0.92
Prothrombin Time: 12.3 seconds (ref 11.4–15.2)

## 2018-04-05 LAB — MRSA PCR SCREENING: MRSA BY PCR: NEGATIVE

## 2018-04-05 MED ORDER — IRBESARTAN 150 MG PO TABS
75.0000 mg | ORAL_TABLET | Freq: Every day | ORAL | Status: DC
  Administered 2018-04-07: 75 mg via ORAL
  Filled 2018-04-05: qty 1

## 2018-04-05 MED ORDER — HYDROMORPHONE HCL 1 MG/ML IJ SOLN: 0.5000 mg | INTRAMUSCULAR | Status: DC | PRN

## 2018-04-05 MED ORDER — ONDANSETRON HCL 4 MG PO TABS: 4.0000 mg | ORAL_TABLET | Freq: Four times a day (QID) | ORAL | Status: DC | PRN

## 2018-04-05 MED ORDER — METOPROLOL SUCCINATE ER 50 MG PO TB24
100.0000 mg | ORAL_TABLET | Freq: Every day | ORAL | Status: DC
  Administered 2018-04-06 – 2018-04-07 (×2): 100 mg via ORAL
  Filled 2018-04-05 (×2): qty 2

## 2018-04-05 MED ORDER — ONDANSETRON HCL 4 MG/2ML IJ SOLN
4.0000 mg | Freq: Four times a day (QID) | INTRAMUSCULAR | Status: DC | PRN
  Administered 2018-04-06: 4 mg via INTRAVENOUS
  Filled 2018-04-05: qty 2

## 2018-04-05 MED ORDER — DOCUSATE SODIUM 100 MG PO CAPS
100.0000 mg | ORAL_CAPSULE | Freq: Two times a day (BID) | ORAL | Status: DC
  Administered 2018-04-06 – 2018-04-07 (×2): 100 mg via ORAL
  Filled 2018-04-05 (×3): qty 1

## 2018-04-05 MED ORDER — SENNOSIDES-DOCUSATE SODIUM 8.6-50 MG PO TABS: 1.0000 | ORAL_TABLET | Freq: Every evening | ORAL | Status: DC | PRN

## 2018-04-05 MED ORDER — METHOCARBAMOL 1000 MG/10ML IJ SOLN
500.0000 mg | Freq: Four times a day (QID) | INTRAVENOUS | Status: DC | PRN
  Filled 2018-04-05: qty 5

## 2018-04-05 MED ORDER — ATORVASTATIN CALCIUM 20 MG PO TABS
80.0000 mg | ORAL_TABLET | Freq: Every day | ORAL | Status: DC
  Administered 2018-04-07: 80 mg via ORAL
  Filled 2018-04-05 (×2): qty 4

## 2018-04-05 MED ORDER — OXYCODONE HCL 5 MG PO TABS
5.0000 mg | ORAL_TABLET | ORAL | Status: DC | PRN
  Administered 2018-04-06 – 2018-04-07 (×3): 5 mg via ORAL
  Filled 2018-04-05 (×3): qty 1

## 2018-04-05 MED ORDER — METHOCARBAMOL 500 MG PO TABS: 500.0000 mg | ORAL_TABLET | Freq: Four times a day (QID) | ORAL | Status: DC | PRN

## 2018-04-05 MED ORDER — ACETAMINOPHEN 500 MG PO TABS
1000.0000 mg | ORAL_TABLET | Freq: Three times a day (TID) | ORAL | Status: DC
  Administered 2018-04-05 – 2018-04-07 (×4): 1000 mg via ORAL
  Filled 2018-04-05 (×4): qty 2

## 2018-04-05 NOTE — ED Triage Notes (Signed)
Pt arrived via ems due to fall cleaning her shower. Pt landed on right knee and rates pain as 9/10 when knee is moved. No distress currently and respirations are even and non labored. Pedal pulses palpable. A&Ox4.

## 2018-04-05 NOTE — Progress Notes (Signed)
*  PRELIMINARY RESULTS* Echocardiogram 2D Echocardiogram has been performed.  Sherrie Sport 04/05/2018, 4:40 PM

## 2018-04-05 NOTE — Consult Note (Signed)
q  Cardiology Consultation Note    Patient ID: Patricia Lawson, MRN: [id number], DOB/AGE: 06-09-1946 72 y.o. Admit date: 04/05/2018   Date of Consult: 04/05/2018 Primary Physician: Yolonda Kida, MD Primary Cardiologist: Dr. Clayborn Bigness  Chief Complaint: right knee pain Reason for Consultation: preop Requesting MD: Dr. Brien Mates  HPI: Patricia Lawson is a 72 y.o. female with history of coronary disease status post PCI in the past who is had no symptoms of ischemia who suffered a mechanical fall today causing fracture of her right patella.  He had seen her primary cardiologist last week and he has scheduled an echo and a functional study due to the fact she had not been evaluated recently.  She was not able to get this test done.  Prior to the fall she has been extremely active with no chest pain able to ambulate 3-4 blocks and walk several flights of stairs without difficulty.  She denies shortness of breath.  Electrocardiogram today shows sinus rhythm with no ischemia.  Echocardiogram shows preserved LV function with no regional wall motion abnormalities.  There is no significant valvular abnormalities.  Past Medical History:  Diagnosis Date  . CHF (congestive heart failure) ([place]) 2010  . Coronary artery disease   . Hypertension       Surgical History:  Past Surgical History:  Procedure Laterality Date  . LAPAROSCOPIC APPENDECTOMY N/A 03/04/2015   Procedure: APPENDECTOMY LAPAROSCOPIC;  Surgeon: Sherri Rad, MD;  Location: [facility] ORS;  Service: General;  Laterality: N/A;  . STENT PLACE LEFT URETER ([place] HX)     cardiac  . THYROIDECTOMY Bilateral      Home Meds: Prior to Admission medications   Medication Sig Start Date End Date Taking? Authorizing Provider  aspirin 81 MG tablet Take 81 mg by mouth daily.   Yes [provider]  atorvastatin (LIPITOR) 80 MG tablet Take 80 mg by mouth daily.   Yes [provider]  irbesartan (AVAPRO) 75 MG tablet Take 75 mg by mouth  daily.   Yes [provider]  metoprolol succinate (TOPROL-XL) 100 MG 24 hr tablet Take 100 mg by mouth daily.   Yes [provider]    Inpatient Medications:     Allergies: No Known Allergies  Social History   Socioeconomic History  . Marital status: Divorced    Spouse name: Not on file  . Number of children: Not on file  . Years of education: Not on file  . Highest education level: Not on file  Occupational History  . Not on file  Social Needs  . Financial resource strain: Not on file  . Food insecurity:    Worry: Not on file    Inability: Not on file  . Transportation needs:    Medical: Not on file    Non-medical: Not on file  Tobacco Use  . Smoking status: Former Smoker    Last attempt to quit: 03/03/2009    Years since quitting: 9.0  Substance and Sexual Activity  . Alcohol use: No  . Drug use: Not on file  . Sexual activity: Not on file  Lifestyle  . Physical activity:    Days per week: Not on file    Minutes per session: Not on file  . Stress: Not on file  Relationships  . Social connections:    Talks on phone: Not on file    Gets together: Not on file    Attends religious service: Not on file    Active member  of club or organization: Not on file    Attends meetings of clubs or organizations: Not on file    Relationship status: Not on file  . Intimate partner violence:    Fear of current or ex partner: Not on file    Emotionally abused: Not on file    Physically abused: Not on file    Forced sexual activity: Not on file  Other Topics Concern  . Not on file  Social History Narrative  . Not on file     Family History  Problem Relation Age of Onset  . Heart block Mother   . Heart block Father      Review of Systems: A 12-system review of systems was performed and is negative except as noted in the HPI.  Labs: No results for input(s): CKTOTAL, CKMB, TROPONINI in the last 72 hours. Lab Results  Component Value Date   WBC 11.7  (H) 03/04/2015   HGB 12.1 03/04/2015   HCT 36.5 03/04/2015   MCV 89.8 03/04/2015   PLT 209 03/04/2015   No results for input(s): NA, K, CL, CO2, BUN, CREATININE, CALCIUM, PROT, BILITOT, ALKPHOS, ALT, AST, GLUCOSE in the last 168 hours.  Invalid input(s): LABALBU Lab Results  Component Value Date   CHOL 276 (H) 06/26/2012   HDL 37 (L) 06/26/2012   LDLCALC 174 (H) 06/26/2012   TRIG 325 (H) 06/26/2012   No results found for: DDIMER  Radiology/Studies:  Ct Knee Right Wo Contrast  Result Date: 04/05/2018 CLINICAL DATA:  The patient suffered a right patellar fracture due to a fall in the shower today. Initial encounter. EXAM: CT OF THE RIGHT KNEE WITHOUT CONTRAST TECHNIQUE: Multidetector CT imaging of the right knee was performed according to the standard protocol. Multiplanar CT image reconstructions were also generated. COMPARISON:  Plain films right knee this same day. FINDINGS: Bones/Joint/Cartilage A transverse fracture through the mid pole of the patella is identified. The inferior pole of the patella is mildly to moderately comminuted. The superior pole is a single fragment. Main fracture fragments are distracted approximately 2 cm. Comminuted fragments are slightly distracted. No other fracture is identified. Ligaments Suboptimally assessed by CT. Muscles and Tendons Intact. Soft tissues Extensive hematoma is present about the patient's fracture. IMPRESSION: Transverse fracture of the mid pole of the patella with 2 cm of distraction. The inferior pole the patella is mildly to moderately comminuted. Electronically Signed   By: Inge Rise M.D.   On: 04/05/2018 14:23   Dg Knee Complete 4 Views Right  Result Date: 04/05/2018 CLINICAL DATA:  Right knee pain following a fall in the shower. EXAM: RIGHT KNEE - COMPLETE 4+ VIEW COMPARISON:  None. FINDINGS: Comminuted patellar fracture with 2 cm of distraction of the fragments. The remaining bones are intact. Probable associated small effusion.  Mild atheromatous arterial calcification. IMPRESSION: Comminuted patellar fracture with 2 cm of distraction of the fragments. Electronically Signed   By: Claudie Revering M.D.   On: 04/05/2018 13:16    Wt Readings from Last 3 Encounters:  04/05/18 68 kg (150 lb)  08/17/15 72.6 kg (160 lb)  03/10/15 79.1 kg (174 lb 6.4 oz)    EKG: Sinus rhythm with no ischemia.  Physical Exam:  Blood pressure 118/66, pulse 67, temperature 98 F (36.7 C), temperature source Oral, resp. rate 16, height 5\' 1"  (1.549 m), weight 68 kg (150 lb), SpO2 96 %. Body mass index is 28.34 kg/m. General: Well developed, well nourished, in no acute distress. Head: Normocephalic, atraumatic,  sclera non-icteric, no xanthomas, nares are without discharge.  Neck: Negative for carotid bruits. JVD not elevated. Lungs: Clear bilaterally to auscultation without wheezes, rales, or rhonchi. Breathing is unlabored. Heart: RRR with S1 S2. No murmurs, rubs, or gallops appreciated. Abdomen: Soft, non-tender, non-distended with normoactive bowel sounds. No hepatomegaly. No rebound/guarding. No obvious abdominal masses. Msk:  Strength and tone appear normal for age. Extremities: No clubbing or cyanosis. No edema.  Distal pedal pulses are 2+ and equal bilaterally.  Has right knee pain. Neuro: Alert and oriented X 3. No facial asymmetry. No focal deficit. Moves all extremities spontaneously. Psych:  Responds to questions appropriately with a normal affect.     Assessment and Plan  72 year old female with history of previous coronary disease revascularized with stent who is been doing quite well with no chest pain.  Electrocardiogram shows no ischemia.  She suffered a right patella fracture.  She will need surgical intervention.  Given the fact she has been quite active leading up to this event without chest pain or shortness of breath puts her at low risk from a cardiac standpoint for surgery.  Echo shows preserved LV function.  Would  continue with current medical regimen.  Okay to hold aspirin if necessary prior to surgery.  Would proceed with surgery without further cardiac work-up.  She is a low risk and well optimized from a cardiac standpoint. Signed, Teodoro Spray MD 04/05/2018, 4:05 PM Pager: [phone]

## 2018-04-05 NOTE — ED Notes (Signed)
Knee immobilizer applied

## 2018-04-05 NOTE — Progress Notes (Signed)
Awake in bed eating snack. Finger nail polish removed in preparation for surgery tomorrow. Call bell in reach.

## 2018-04-05 NOTE — Progress Notes (Signed)
Assessment done. Awake in bed, denies pain without movement of rt leg. Knee immobilizer on and secured. Call bell in reach, instructed to call for needs.

## 2018-04-05 NOTE — ED Provider Notes (Signed)
[facility] Emergency Department Provider Note  ____________________________________________  Time seen: Approximately 3:09 PM  I have reviewed the triage vital signs and the nursing notes.   HISTORY  Chief Complaint Fall    HPI Patricia Lawson is a 72 y.o. female with a history of CHF, CAD, hypertension who reports she was cleaning her bathroom today when she fell directly onto her right knee causing immediately pain and swelling. pain is mild but severely worsened with any movement of the knee. No alleviating factors, nonradiating.     Past Medical History:  Diagnosis Date  . CHF (congestive heart failure) ([place]) 2010  . Coronary artery disease   . Hypertension      Patient Active Problem List   Diagnosis Date Noted  . Appendicitis, acute 03/04/2015     Past Surgical History:  Procedure Laterality Date  . LAPAROSCOPIC APPENDECTOMY N/A 03/04/2015   Procedure: APPENDECTOMY LAPAROSCOPIC;  Surgeon: Sherri Rad, MD;  Location: [facility] ORS;  Service: General;  Laterality: N/A;  . STENT PLACE LEFT URETER ([place] HX)     cardiac  . THYROIDECTOMY Bilateral      Prior to Admission medications   Medication Sig Start Date End Date Taking? Authorizing Provider  aspirin 81 MG tablet Take 81 mg by mouth daily.   Yes [provider]  atorvastatin (LIPITOR) 80 MG tablet Take 80 mg by mouth daily.   Yes [provider]  irbesartan (AVAPRO) 75 MG tablet Take 75 mg by mouth daily.   Yes [provider]  metoprolol succinate (TOPROL-XL) 100 MG 24 hr tablet Take 100 mg by mouth daily.   Yes [provider]     Allergies Patient has no known allergies.   Family History  Problem Relation Age of Onset  . Heart block Mother   . Heart block Father     Social History Social History   Tobacco Use  . Smoking status: Former Smoker    Last attempt to quit: 03/03/2009    Years since quitting: 9.0  Substance Use Topics  . Alcohol  use: No  . Drug use: Not on file    Review of Systems  Constitutional:   No fever or chills.  ENT:   No sore throat. No rhinorrhea. Cardiovascular:   No chest pain or syncope. Respiratory:   No dyspnea or cough. Gastrointestinal:   Negative for abdominal pain, vomiting and diarrhea.  Musculoskeletal:   right knee pain as above All other systems reviewed and are negative except as documented above in ROS and HPI.  ____________________________________________   PHYSICAL EXAM:  VITAL SIGNS: ED Triage Vitals  Enc Vitals Group     BP 04/05/18 1246 (!) 144/96     Pulse Rate 04/05/18 1246 60     Resp 04/05/18 1246 20     Temp 04/05/18 1246 98 F (36.7 C)     Temp Source 04/05/18 1246 Oral     SpO2 04/05/18 1244 97 %     Weight 04/05/18 1247 150 lb (68 kg)     Height 04/05/18 1247 5\' 1"  (1.549 m)     Head Circumference --      Peak Flow --      Pain Score 04/05/18 1248 0     Pain Loc --      Pain Edu? --      Excl. in [place]? --     Vital signs reviewed, nursing assessments reviewed.   Constitutional:   Alert and oriented. Well appearing  and in no distress. Eyes:   Conjunctivae are normal. EOMI. PERRL. ENT      Head:   Normocephalic and atraumatic.      Nose:   No congestion/rhinnorhea.       Mouth/Throat:   MMM, no pharyngeal erythema. No peritonsillar mass.       Neck:   No meningismus. Full ROM. Hematological/Lymphatic/Immunilogical:   No cervical lymphadenopathy. Cardiovascular:   RRR. Symmetric bilateral radial and DP pulses.  No murmurs.  Respiratory:   Normal respiratory effort without tachypnea/retractions. Breath sounds are clear and equal bilaterally. No wheezes/rales/rhonchi. Gastrointestinal:   Soft and nontender. Non distended. There is no CVA tenderness.  No rebound, rigidity, or guarding.  Musculoskeletal:   large effusion of the right knee. Ligaments are stable. Patella is palpable in 2 pieces . No other injuries, long bones non tenders.   Neurologic:    Normal speech and language.  Motor grossly intact. No acute focal neurologic deficits are appreciated.  Skin:    Skin is warm, dry and intact. No rash noted.  No petechiae, purpura, or bullae.  ____________________________________________    LABS (pertinent positives/negatives) (all labs ordered are listed, but only abnormal results are displayed) Labs Reviewed - No data to display ____________________________________________   EKG  interpreted by me Sinus rhythm rate is 65, normal axis and intervals. Normal QRS ST segments and T waves.  ____________________________________________    RADIOLOGY  Ct Knee Right Wo Contrast  Result Date: 04/05/2018 CLINICAL DATA:  The patient suffered a right patellar fracture due to a fall in the shower today. Initial encounter. EXAM: CT OF THE RIGHT KNEE WITHOUT CONTRAST TECHNIQUE: Multidetector CT imaging of the right knee was performed according to the standard protocol. Multiplanar CT image reconstructions were also generated. COMPARISON:  Plain films right knee this same day. FINDINGS: Bones/Joint/Cartilage A transverse fracture through the mid pole of the patella is identified. The inferior pole of the patella is mildly to moderately comminuted. The superior pole is a single fragment. Main fracture fragments are distracted approximately 2 cm. Comminuted fragments are slightly distracted. No other fracture is identified. Ligaments Suboptimally assessed by CT. Muscles and Tendons Intact. Soft tissues Extensive hematoma is present about the patient's fracture. IMPRESSION: Transverse fracture of the mid pole of the patella with 2 cm of distraction. The inferior pole the patella is mildly to moderately comminuted. Electronically Signed   By: Inge Rise M.D.   On: 04/05/2018 14:23   Dg Knee Complete 4 Views Right  Result Date: 04/05/2018 CLINICAL DATA:  Right knee pain following a fall in the shower. EXAM: RIGHT KNEE - COMPLETE 4+ VIEW COMPARISON:   None. FINDINGS: Comminuted patellar fracture with 2 cm of distraction of the fragments. The remaining bones are intact. Probable associated small effusion. Mild atheromatous arterial calcification. IMPRESSION: Comminuted patellar fracture with 2 cm of distraction of the fragments. Electronically Signed   By: Claudie Revering M.D.   On: 04/05/2018 13:16    ____________________________________________   PROCEDURES Procedures  ____________________________________________  DIFFERENTIAL DIAGNOSIS   patellar fracture, tibial plateau, femur fracture  CLINICAL IMPRESSION / ASSESSMENT AND PLAN / ED COURSE  Pertinent labs & imaging results that were available during my care of the patient were reviewed by me and considered in my medical decision making (see chart for details).    patient presents with large traumatic effusion of the prepatellar space the right knee. No lacerations. Knee joint is stable. X-ray ordered to evaluate.  Clinical Course as of Apr 06 1639  Fri Apr 05, 2018  1344 Xray knee shows patella fx. D/w ortho who is eval'ing pt at bedside now.  Dr. Posey Pronto requests CT knee for further eval and pre-op planning.    [PS]  4680 CT shows comminuted patella fx. D/w Dr. Posey Pronto again, requests hospitalist admission, needs pre-op clearance for surgical repair.    [PS]    Clinical Course User Index [PS] Carrie Mew, MD      ____________________________________________   FINAL CLINICAL IMPRESSION(S) / ED DIAGNOSES    Final diagnoses:  Closed displaced comminuted fracture of right patella, initial encounter     ED Discharge Orders    None      Portions of this note were generated with dragon dictation software. Dictation errors may occur despite best attempts at proofreading.    Carrie Mew, MD 04/05/18 1640

## 2018-04-05 NOTE — Consult Note (Signed)
ORTHOPAEDIC CONSULTATION  REQUESTING PHYSICIAN: Carrie Mew, MD  Chief Complaint:   R knee pain  History of Present Illness: Patricia Lawson is a 72 y.o. female who had a fall at home earlier today while cleaning the shower. She fell directly on the ledge and felt it go through her patella.  Pain is described as sharp at its worst and a dull ache at its best.  Pain is rated a 10 out of 10 in severity.  Pain is improved with rest and immobilization.  Pain is worse with any sort of movement.  X-rays in the emergency department show a right patella fracture.  She states she has had a history of MI in ~2000 and was scheduled to undergo standard cardiac monitoring including echo and stress test in next couple of weeks.   Past Medical History:  Diagnosis Date  . CHF (congestive heart failure) ([place]) 2010  . Coronary artery disease   . Hypertension   CAD & history of myocardial infarction in 2000.    Past Surgical History:  Procedure Laterality Date  . LAPAROSCOPIC APPENDECTOMY N/A 03/04/2015   Procedure: APPENDECTOMY LAPAROSCOPIC;  Surgeon: Sherri Rad, MD;  Location: [facility] ORS;  Service: General;  Laterality: N/A;  . STENT PLACE LEFT URETER ([place] HX)     cardiac  . THYROIDECTOMY Bilateral    Social History   Socioeconomic History  . Marital status: Divorced    Spouse name: Not on file  . Number of children: Not on file  . Years of education: Not on file  . Highest education level: Not on file  Occupational History  . Not on file  Social Needs  . Financial resource strain: Not on file  . Food insecurity:    Worry: Not on file    Inability: Not on file  . Transportation needs:    Medical: Not on file    Non-medical: Not on file  Tobacco Use  . Smoking status: Former Smoker    Last attempt to quit: 03/03/2009    Years since quitting: 9.0  Substance and Sexual Activity  . Alcohol use: No  . Drug use: Not on file   . Sexual activity: Not on file  Lifestyle  . Physical activity:    Days per week: Not on file    Minutes per session: Not on file  . Stress: Not on file  Relationships  . Social connections:    Talks on phone: Not on file    Gets together: Not on file    Attends religious service: Not on file    Active member of club or organization: Not on file    Attends meetings of clubs or organizations: Not on file    Relationship status: Not on file  Other Topics Concern  . Not on file  Social History Narrative  . Not on file   Family History  Problem Relation Age of Onset  . Heart block Mother   . Heart block Father    No Known Allergies Prior to Admission medications   Medication Sig Start Date End Date Taking? Authorizing Provider  aspirin 81 MG tablet Take 81 mg by mouth daily.   Yes [provider]  atorvastatin (LIPITOR) 80 MG tablet Take 80 mg by mouth daily.   Yes [provider]  irbesartan (AVAPRO) 75 MG tablet Take 75 mg by mouth daily.   Yes [provider]  metoprolol succinate (TOPROL-XL) 100 MG 24 hr tablet Take 100 mg by mouth daily.  Yes [provider]   No results for input(s): WBC, HGB, HCT, PLT, K, CL, CO2, BUN, CREATININE, GLUCOSE, CALCIUM, LABPT, INR in the last 72 hours. Ct Knee Right Wo Contrast  Result Date: 04/05/2018 CLINICAL DATA:  The patient suffered a right patellar fracture due to a fall in the shower today. Initial encounter. EXAM: CT OF THE RIGHT KNEE WITHOUT CONTRAST TECHNIQUE: Multidetector CT imaging of the right knee was performed according to the standard protocol. Multiplanar CT image reconstructions were also generated. COMPARISON:  Plain films right knee this same day. FINDINGS: Bones/Joint/Cartilage A transverse fracture through the mid pole of the patella is identified. The inferior pole of the patella is mildly to moderately comminuted. The superior pole is a single fragment. Main fracture fragments are  distracted approximately 2 cm. Comminuted fragments are slightly distracted. No other fracture is identified. Ligaments Suboptimally assessed by CT. Muscles and Tendons Intact. Soft tissues Extensive hematoma is present about the patient's fracture. IMPRESSION: Transverse fracture of the mid pole of the patella with 2 cm of distraction. The inferior pole the patella is mildly to moderately comminuted. Electronically Signed   By: Inge Rise M.D.   On: 04/05/2018 14:23   Dg Knee Complete 4 Views Right  Result Date: 04/05/2018 CLINICAL DATA:  Right knee pain following a fall in the shower. EXAM: RIGHT KNEE - COMPLETE 4+ VIEW COMPARISON:  None. FINDINGS: Comminuted patellar fracture with 2 cm of distraction of the fragments. The remaining bones are intact. Probable associated small effusion. Mild atheromatous arterial calcification. IMPRESSION: Comminuted patellar fracture with 2 cm of distraction of the fragments. Electronically Signed   By: Claudie Revering M.D.   On: 04/05/2018 13:16     Positive ROS: All other systems have been reviewed and were otherwise negative with the exception of those mentioned in the HPI and as above.  Physical Exam: BP 118/66   Pulse 67   Temp 98 F (36.7 C) (Oral)   Resp 16   Ht 5\' 1"  (1.549 m)   Wt 68 kg (150 lb)   SpO2 96%   BMI 28.34 kg/m  General:  Alert, no acute distress Psychiatric:  Patient is competent for consent with normal mood and affect   Cardiovascular:  No pedal edema, regular rate and rhythm Respiratory:  No wheezing, non-labored breathing GI:  Abdomen is soft and non-tender Skin:  No lesions in the area of chief complaint, no erythema Neurologic:  Sensation intact distally, CN grossly intact Lymphatic:  No axillary or cervical lymphadenopathy  Orthopedic Exam:  RLE: 5/5 DF/PF/EHL SILT s/s/t/sp/dp distr Foot wwp Significant swelling about patella with palpable fluid over mid-patella +ecchymosis about patella.  Unable to perform  straight leg raise   X-rays &CT scan:  As above: R transverse patella fracture with inferior segment comminution  Assessment/Plan: Patricia Lawson is a 72 y.o. female with a R transverse patella fracture   1. I discussed the various treatment options including both surgical and non-surgical management of her fracture with the patient and her daughter. We discussed the risk of perioperative complications due to patient's age, bone quality, and co-morbidities including cardiac disease. We also discussed risk of nonunion/malunion with surgery as well as hardware complication. After discussion of risks, benefits, and alternatives to surgery, the family and patient were in agreement to proceed with surgery. Plan for ORIF R patella tomorrow, 04/06/18 in AM.  2. NPO after midnight 3. Hold anticoagulation in advance of OR 4. Admit to Hospitalist service 5. Discussed obtaining  Cardiology clearance with Dr. Bethanne Ginger team in advance of OR.       Leim Fabry   04/05/2018 4:03 PM

## 2018-04-05 NOTE — ED Notes (Addendum)
Patient transported to CT 

## 2018-04-06 ENCOUNTER — Encounter
Admission: EM | Disposition: A | Discharge status: Home Health | Payer: Self-pay | Source: Home / Self Care | Attending: Orthopedic Surgery

## 2018-04-06 ENCOUNTER — Inpatient Hospital Stay: Payer: Medicare Other

## 2018-04-06 ENCOUNTER — Inpatient Hospital Stay: Payer: Medicare Other | Admitting: Anesthesiology

## 2018-04-06 ENCOUNTER — Encounter: Payer: Self-pay | Admitting: Certified Registered Nurse Anesthetist

## 2018-04-06 HISTORY — PX: ORIF PATELLA: SHX5033

## 2018-04-06 LAB — ECHOCARDIOGRAM COMPLETE
Height: 61 in
WEIGHTICAEL: 2400 [oz_av]

## 2018-04-06 SURGERY — OPEN REDUCTION INTERNAL FIXATION (ORIF) PATELLA
Anesthesia: Spinal | Laterality: Right

## 2018-04-06 MED ORDER — SODIUM CHLORIDE 0.9 % IJ SOLN
INTRAMUSCULAR | Status: AC
  Filled 2018-04-06: qty 20

## 2018-04-06 MED ORDER — ONDANSETRON HCL 4 MG/2ML IJ SOLN
INTRAMUSCULAR | Status: AC
  Filled 2018-04-06: qty 2

## 2018-04-06 MED ORDER — FENTANYL CITRATE (PF) 100 MCG/2ML IJ SOLN
INTRAMUSCULAR | Status: AC
  Filled 2018-04-06: qty 2

## 2018-04-06 MED ORDER — MIDAZOLAM HCL 2 MG/2ML IJ SOLN
INTRAMUSCULAR | Status: AC
  Filled 2018-04-06: qty 2

## 2018-04-06 MED ORDER — DEXAMETHASONE SODIUM PHOSPHATE 10 MG/ML IJ SOLN
INTRAMUSCULAR | Status: AC
  Filled 2018-04-06: qty 1

## 2018-04-06 MED ORDER — PHENYLEPHRINE HCL 10 MG/ML IJ SOLN
INTRAMUSCULAR | Status: DC | PRN
  Administered 2018-04-06: 100 ug via INTRAVENOUS

## 2018-04-06 MED ORDER — LACTATED RINGERS IV SOLN
INTRAVENOUS | Status: DC | PRN
  Administered 2018-04-06: 11:00:00 via INTRAVENOUS

## 2018-04-06 MED ORDER — SUCCINYLCHOLINE CHLORIDE 20 MG/ML IJ SOLN
INTRAMUSCULAR | Status: AC
  Filled 2018-04-06: qty 1

## 2018-04-06 MED ORDER — PROPOFOL 500 MG/50ML IV EMUL
INTRAVENOUS | Status: AC
  Filled 2018-04-06: qty 50

## 2018-04-06 MED ORDER — LIDOCAINE HCL (CARDIAC) PF 100 MG/5ML IV SOSY
PREFILLED_SYRINGE | INTRAVENOUS | Status: DC | PRN
  Administered 2018-04-06: 100 mg via INTRAVENOUS

## 2018-04-06 MED ORDER — CEFAZOLIN SODIUM 1 G IJ SOLR
INTRAMUSCULAR | Status: AC
  Filled 2018-04-06: qty 20

## 2018-04-06 MED ORDER — NEOMYCIN-POLYMYXIN B GU 40-200000 IR SOLN
Status: DC | PRN
  Administered 2018-04-06: 4 mL

## 2018-04-06 MED ORDER — CEFAZOLIN SODIUM-DEXTROSE 2-3 GM-%(50ML) IV SOLR
INTRAVENOUS | Status: DC | PRN
  Administered 2018-04-06: 2 g via INTRAVENOUS

## 2018-04-06 MED ORDER — NEOMYCIN-POLYMYXIN B GU 40-200000 IR SOLN
Status: AC
  Filled 2018-04-06: qty 4

## 2018-04-06 MED ORDER — CEFAZOLIN SODIUM-DEXTROSE 1-4 GM/50ML-% IV SOLN
1.0000 g | Freq: Four times a day (QID) | INTRAVENOUS | Status: AC
  Administered 2018-04-06 – 2018-04-07 (×3): 1 g via INTRAVENOUS
  Filled 2018-04-06 (×3): qty 50

## 2018-04-06 MED ORDER — BUPIVACAINE HCL (PF) 0.5 % IJ SOLN
INTRAMUSCULAR | Status: DC | PRN
  Administered 2018-04-06: 3 mL

## 2018-04-06 MED ORDER — MIDAZOLAM HCL 5 MG/5ML IJ SOLN
INTRAMUSCULAR | Status: DC | PRN
  Administered 2018-04-06: 2 mg via INTRAVENOUS

## 2018-04-06 MED ORDER — ONDANSETRON HCL 4 MG/2ML IJ SOLN
4.0000 mg | Freq: Once | INTRAMUSCULAR | Status: AC | PRN
  Administered 2018-04-06: 4 mg via INTRAVENOUS

## 2018-04-06 MED ORDER — FENTANYL CITRATE (PF) 100 MCG/2ML IJ SOLN
25.0000 ug | INTRAMUSCULAR | Status: DC | PRN
  Administered 2018-04-06 (×4): 25 ug via INTRAVENOUS

## 2018-04-06 MED ORDER — LIDOCAINE HCL (PF) 2 % IJ SOLN
INTRAMUSCULAR | Status: AC
  Filled 2018-04-06: qty 10

## 2018-04-06 MED ORDER — PROPOFOL 500 MG/50ML IV EMUL
INTRAVENOUS | Status: DC | PRN
  Administered 2018-04-06: 50 ug/kg/min via INTRAVENOUS

## 2018-04-06 MED ORDER — ASPIRIN EC 325 MG PO TBEC
325.0000 mg | DELAYED_RELEASE_TABLET | Freq: Every day | ORAL | Status: DC
  Administered 2018-04-07: 325 mg via ORAL
  Filled 2018-04-06: qty 1

## 2018-04-06 MED ORDER — FENTANYL CITRATE (PF) 100 MCG/2ML IJ SOLN
INTRAMUSCULAR | Status: DC | PRN
  Administered 2018-04-06: 50 ug via INTRAVENOUS
  Administered 2018-04-06 (×2): 25 ug via INTRAVENOUS

## 2018-04-06 MED ORDER — PROMETHAZINE HCL 25 MG/ML IJ SOLN
12.5000 mg | Freq: Once | INTRAMUSCULAR | Status: DC
  Filled 2018-04-06: qty 1

## 2018-04-06 MED ORDER — ONDANSETRON HCL 4 MG/2ML IJ SOLN
INTRAMUSCULAR | Status: DC | PRN
  Administered 2018-04-06: 4 mg via INTRAVENOUS

## 2018-04-06 MED ORDER — ROCURONIUM BROMIDE 50 MG/5ML IV SOLN
INTRAVENOUS | Status: AC
  Filled 2018-04-06: qty 1

## 2018-04-06 MED ORDER — PROPOFOL 10 MG/ML IV BOLUS
INTRAVENOUS | Status: AC
  Filled 2018-04-06: qty 20

## 2018-04-06 SURGICAL SUPPLY — 49 items
BANDAGE ELASTIC 6 LF NS (GAUZE/BANDAGES/DRESSINGS) ×3 IMPLANT
BIT DRILL 2.6 CANN (BIT) ×3 IMPLANT
BLADE SURG 15 STRL LF DISP TIS (BLADE) ×3 IMPLANT
BLADE SURG 15 STRL SS (BLADE) ×6
BRACE BREG KO (ORTHOPEDIC SUPPLIES) ×3 IMPLANT
CANISTER SUCT 1200ML W/VALVE (MISCELLANEOUS) ×3 IMPLANT
CHLORAPREP W/TINT 26ML (MISCELLANEOUS) ×3 IMPLANT
CUFF TOURN 24 STER (MISCELLANEOUS) IMPLANT
CUFF TOURN 30 STER DUAL PORT (MISCELLANEOUS) ×3 IMPLANT
DRAPE C-ARM XRAY 36X54 (DRAPES) ×3 IMPLANT
DRAPE C-ARMOR (DRAPES) ×3 IMPLANT
DRSG OPSITE POSTOP 4X6 (GAUZE/BANDAGES/DRESSINGS) ×3 IMPLANT
ELECT CAUTERY BLADE 6.4 (BLADE) ×3 IMPLANT
ELECT REM PT RETURN 9FT ADLT (ELECTROSURGICAL) ×3
ELECTRODE REM PT RTRN 9FT ADLT (ELECTROSURGICAL) ×1 IMPLANT
FIBERTAPE 2 W/STRL NDL 17 (SUTURE) ×6 IMPLANT
GAUZE PETRO XEROFOAM 1X8 (MISCELLANEOUS) ×3 IMPLANT
GAUZE SPONGE 4X4 12PLY STRL (GAUZE/BANDAGES/DRESSINGS) IMPLANT
GAUZE XEROFORM 4X4 STRL (GAUZE/BANDAGES/DRESSINGS) ×3 IMPLANT
GLOVE BIOGEL PI IND STRL 8 (GLOVE) ×1 IMPLANT
GLOVE BIOGEL PI INDICATOR 8 (GLOVE) ×2
GLOVE SURG SYN 7.5  E (GLOVE) ×8
GLOVE SURG SYN 7.5 E (GLOVE) ×4 IMPLANT
GOWN STRL REUS W/ TWL LRG LVL3 (GOWN DISPOSABLE) ×2 IMPLANT
GOWN STRL REUS W/ TWL XL LVL3 (GOWN DISPOSABLE) ×1 IMPLANT
GOWN STRL REUS W/TWL LRG LVL3 (GOWN DISPOSABLE) ×4
GOWN STRL REUS W/TWL XL LVL3 (GOWN DISPOSABLE) ×2
GUIDEWIRE 1.35MM  DUAL TROCAR (WIRE) ×4
GUIDEWIRE 1.35MM DUAL TROCAR (WIRE) ×2 IMPLANT
IMMOB KNEE 24 THIGH 24 443303 (SOFTGOODS) IMPLANT
K-WIRE 1.6 (WIRE) ×2
K-WIRE FX5X1.6XNS BN SS (WIRE) ×1
KIT TURNOVER KIT A (KITS) ×3 IMPLANT
KWIRE FX5X1.6XNS BN SS (WIRE) ×1 IMPLANT
NEEDLE FILTER BLUNT 18X 1/2SAF (NEEDLE) ×2
NEEDLE FILTER BLUNT 18X1 1/2 (NEEDLE) ×1 IMPLANT
NS IRRIG 1000ML POUR BTL (IV SOLUTION) ×3 IMPLANT
PACK TOTAL KNEE (MISCELLANEOUS) ×3 IMPLANT
PAD ABD DERMACEA PRESS 5X9 (GAUZE/BANDAGES/DRESSINGS) ×3 IMPLANT
PAD WRAPON POLAR KNEE (MISCELLANEOUS) ×1 IMPLANT
PEG THREADED 2.5MMX14MM LONG (Peg) ×3 IMPLANT
PEG THREADED 2.5MMX18MM LONG (Peg) ×3 IMPLANT
SCREW 4X36MM CANN LO-PRO (Screw) ×3 IMPLANT
SCREW CANN BLUNT TIP 4X38 LP (Screw) ×3 IMPLANT
STAPLER SKIN PROX 35W (STAPLE) ×3 IMPLANT
SUT VIC AB 0 CT1 36 (SUTURE) ×3 IMPLANT
SUT VIC AB 2-0 CT1 (SUTURE) ×3 IMPLANT
SYR 5ML LL (SYRINGE) ×3 IMPLANT
WRAPON POLAR PAD KNEE (MISCELLANEOUS) ×3

## 2018-04-06 NOTE — Progress Notes (Signed)
Bladder scan done. 271 ml of urine measured. Dr Ronelle Nigh notified. No new orders given.

## 2018-04-06 NOTE — Progress Notes (Signed)
MD Leim Fabry, notified that pt continues to be nauseous and vomiting. Order received for Phenergan 12.5 mg X1. MD also notified that pt hasn't urinated since this am. Orders received for in and out X1 for bladder scan > 400 ml.

## 2018-04-06 NOTE — Transfer of Care (Signed)
Immediate Anesthesia Transfer of Care Note  Patient: Patricia Lawson  Procedure(s) Performed: OPEN REDUCTION INTERNAL (ORIF) FIXATION PATELLA (Right )  Patient Location: PACU  Anesthesia Type:Spinal  Level of Consciousness: awake, alert  and oriented  Airway & Oxygen Therapy: Patient Spontanous Breathing  Post-op Assessment: Report given to RN and Post -op Vital signs reviewed and stable  Post vital signs: Reviewed and stable  Last Vitals:  Vitals Value Taken Time  BP 108/62 04/06/2018  2:03 PM  Temp 36.7 C 04/06/2018  2:03 PM  Pulse 71 04/06/2018  2:03 PM  Resp 10 04/06/2018  2:03 PM  SpO2 92 % 04/06/2018  2:03 PM  Vitals shown include unvalidated device data.  Last Pain:  Vitals:   04/06/18 0725  TempSrc:   PainSc: 0-No pain         Complications: No apparent anesthesia complications

## 2018-04-06 NOTE — Anesthesia Procedure Notes (Signed)
Date/Time: 04/06/2018 11:10 AM Performed by: Johnna Acosta, CRNA Pre-anesthesia Checklist: Patient identified, Emergency Drugs available, Suction available, Patient being monitored and Timeout performed Patient Re-evaluated:Patient Re-evaluated prior to induction Oxygen Delivery Method: Simple face mask Preoxygenation: Pre-oxygenation with 100% oxygen

## 2018-04-06 NOTE — H&P (Signed)
H&P is copy & paste from Orthopedic Consult Note on 04/05/18 with appropriate modifications.   ORTHOPAEDIC HISTORY AND PHYSICAL  Chief Complaint:   R knee pain  History of Present Illness: Patricia Lawson is a 72 y.o. female who had a fall at home earlier today while cleaning the shower. She fell directly on the ledge and felt it go through her patella.  Pain is described as sharp at its worst and a dull ache at its best.  Pain is rated a 10 out of 10 in severity.  Pain is improved with rest and immobilization.  Pain is worse with any sort of movement.  X-rays in the emergency department show a right patella fracture.  She states she has had a history of MI in ~2000 and was scheduled to undergo standard cardiac monitoring including echo and stress test in next couple of weeks.       Past Medical History:  Diagnosis Date  . CHF (congestive heart failure) ([place]) 2010  . Coronary artery disease   . Hypertension   CAD & history of myocardial infarction in 2000.         Past Surgical History:  Procedure Laterality Date  . LAPAROSCOPIC APPENDECTOMY N/A 03/04/2015   Procedure: APPENDECTOMY LAPAROSCOPIC;  Surgeon: Sherri Rad, MD;  Location: [facility] ORS;  Service: General;  Laterality: N/A;  . STENT PLACE LEFT URETER ([place] [facility])     cardiac  . THYROIDECTOMY Bilateral    Social History        Socioeconomic History  . Marital status: Divorced    Spouse name: Not on file  . Number of children: Not on file  . Years of education: Not on file  . Highest education level: Not on file  Occupational History  . Not on file  Social Needs  . Financial resource strain: Not on file  . Food insecurity:    Worry: Not on file    Inability: Not on file  . Transportation needs:    Medical: Not on file    Non-medical: Not on file  Tobacco Use  . Smoking status: Former Smoker    Last attempt to quit: 03/03/2009    Years since quitting: 9.0  Substance and Sexual Activity  .  Alcohol use: No  . Drug use: Not on file  . Sexual activity: Not on file  Lifestyle  . Physical activity:    Days per week: Not on file    Minutes per session: Not on file  . Stress: Not on file  Relationships  . Social connections:    Talks on phone: Not on file    Gets together: Not on file    Attends religious service: Not on file    Active member of club or organization: Not on file    Attends meetings of clubs or organizations: Not on file    Relationship status: Not on file  Other Topics Concern  . Not on file  Social History Narrative  . Not on file        Family History  Problem Relation Age of Onset  . Heart block Mother   . Heart block Father    No Known Allergies        Prior to Admission medications   Medication Sig Start Date End Date Taking? Authorizing Provider  aspirin 81 MG tablet Take 81 mg by mouth daily.   Yes [provider]  atorvastatin (LIPITOR) 80 MG tablet Take 80 mg by mouth daily.  Yes [provider]  irbesartan (AVAPRO) 75 MG tablet Take 75 mg by mouth daily.   Yes [provider]  metoprolol succinate (TOPROL-XL) 100 MG 24 hr tablet Take 100 mg by mouth daily.   Yes [provider]   RecentLabs(last2labs)  No results for input(s): WBC, HGB, HCT, PLT, K, CL, CO2, BUN, CREATININE, GLUCOSE, CALCIUM, LABPT, INR in the last 72 hours.    ImagingResults(Last48hours)  Ct Knee Right Wo Contrast  Result Date: 04/05/2018 CLINICAL DATA:  The patient suffered a right patellar fracture due to a fall in the shower today. Initial encounter. EXAM: CT OF THE RIGHT KNEE WITHOUT CONTRAST TECHNIQUE: Multidetector CT imaging of the right knee was performed according to the standard protocol. Multiplanar CT image reconstructions were also generated. COMPARISON:  Plain films right knee this same day. FINDINGS: Bones/Joint/Cartilage A transverse fracture through the mid pole of the patella is  identified. The inferior pole of the patella is mildly to moderately comminuted. The superior pole is a single fragment. Main fracture fragments are distracted approximately 2 cm. Comminuted fragments are slightly distracted. No other fracture is identified. Ligaments Suboptimally assessed by CT. Muscles and Tendons Intact. Soft tissues Extensive hematoma is present about the patient's fracture. IMPRESSION: Transverse fracture of the mid pole of the patella with 2 cm of distraction. The inferior pole the patella is mildly to moderately comminuted. Electronically Signed   By: Inge Rise M.D.   On: 04/05/2018 14:23   Dg Knee Complete 4 Views Right  Result Date: 04/05/2018 CLINICAL DATA:  Right knee pain following a fall in the shower. EXAM: RIGHT KNEE - COMPLETE 4+ VIEW COMPARISON:  None. FINDINGS: Comminuted patellar fracture with 2 cm of distraction of the fragments. The remaining bones are intact. Probable associated small effusion. Mild atheromatous arterial calcification. IMPRESSION: Comminuted patellar fracture with 2 cm of distraction of the fragments. Electronically Signed   By: Claudie Revering M.D.   On: 04/05/2018 13:16      Positive ROS: All other systems have been reviewed and were otherwise negative with the exception of those mentioned in the HPI and as above.  Physical Exam: BP 118/66   Pulse 67   Temp 98 F (36.7 C) (Oral)   Resp 16   Ht 5\' 1"  (1.549 m)   Wt 68 kg (150 lb)   SpO2 96%   BMI 28.34 kg/m  General:  Alert, no acute distress Psychiatric:  Patient is competent for consent with normal mood and affect   Cardiovascular:  No pedal edema, regular rate and rhythm Respiratory:  No wheezing, non-labored breathing GI:  Abdomen is soft and non-tender Skin:  No lesions in the area of chief complaint, no erythema Neurologic:  Sensation intact distally, CN grossly intact Lymphatic:  No axillary or cervical lymphadenopathy  Orthopedic Exam:  RLE: 5/5 DF/PF/EHL SILT  s/s/t/sp/dp distr Foot wwp Significant swelling about patella with palpable fluid over mid-patella +ecchymosis about patella.  Unable to perform straight leg raise   X-rays &CT scan:  As above: R transverse patella fracture with inferior segment comminution  Assessment/Plan: Patricia Lawson is a 72 y.o. female with a R transverse patella fracture  1. I discussed the various treatment options including both surgical and non-surgical management of her fracture with the patient and her daughter.We discussed the risk of perioperative complications due to patient's age, bone quality, and co-morbidities including cardiac disease. We also discussed risk of nonunion/malunion with surgery as well as hardware complication. After discussion of risks,  benefits, and alternatives to surgery, the family and patient were in agreement to proceed with surgery. Plan for ORIF R patella tomorrow, 04/06/18 in AM.  2. NPO after midnight 3. Hold anticoagulation in advance of OR 4. Discussed obtaining Cardiology clearance with Dr. Bethanne Ginger team in advance of OR.   Leim Fabry

## 2018-04-06 NOTE — Anesthesia Post-op Follow-up Note (Signed)
Anesthesia QCDR form completed.        

## 2018-04-06 NOTE — Op Note (Addendum)
Operative Note    SURGERY DATE: 04/06/2018   PRE-OP DIAGNOSIS:  1. Right patella fracture   POST-OP DIAGNOSIS:  1. Right patella fracture   PROCEDURES:  1. ORIF Right patella    SURGEON: Cato Mulligan, MD   ANESTHESIA: spinal anesthesia   ESTIMATED BLOOD LOSS: minimal   TOTAL IV FLUIDS: see anesthesia record  TOURNIQUET TIME: 116 min  IMPLANTS: Arthrex 4.29mm partially threaded cannulated screws x2 Hand Innovations 2.59mm partially threaded peg x1   INDICATION(S):  Patricia Lawson is a 72 y.o. female who sustained a transverse patella fracture after falling directly on the knee yesterday. The patient is unable to perform a straight leg raise. After discussion of risks, benefits, and alternatives to surgery, the patient and family elected to proceed with the above procedure.   OPERATIVE FINDINGS: transverse patella fracture with significant comminution of the inferior fragment in multiple planes   OPERATIVE REPORT:   I identified TYLICIA SHERMAN in the pre-operative holding area. Informed consent was obtained and the surgical site was marked. I reviewed the risks and benefits of the proposed surgical intervention and the patient wished to proceed. The patient was transferred to the operative suite and spinal anesthesia was administered. The patient was transferred to the operating room table and placed in a supine position. All down side pressure points were appropriately padded. Appropriate IV antibiotics were administered within 30 minutes before incision. The extremity was then prepped and draped in standard fashion. A time out was performed confirming the correct extremity, correct patient, and correct procedure.   An Esmarch bandage was used to exsanguinate the leg and the tourniquet was inflated.  An ~10 cm longitudinal midline incision was made over the knee.  Dissection was carried down sharply to the fracture site.  There is significant hematoma.  This was debrided with a  combination of a rondure curette and irrigation.  The transverse fracture site was identified.  Loose periosteum was sharply excised about the fracture site to better define the fracture edges. The fracture site was debrided with a curette and copiously irrigated to remove all debris.  There was significant comminution of the inferior pole in multiple planes with cancellous bone loss as well as an irreparable piece containing articular cartilage.  This articular piece was removed.  The fracture was reduced with two lobster claw clamps. Reduction was verified with fluoroscopy and under direct visualization.  2 K wires were drilled from inferior to superior with appropriate position checked on fluoroscopy.  These K wires were overdrilled, size was measured, and 2 partially threaded cannulated screws (38 mm laterally and 36 mm medially) were inserted.  A fiber tape on a needle was passed through both screws and tied to create a tension band construct.  There was an articular fragment off of the main inferior patellar piece medially that was not well fixed.  A hand innovations 2.5 mm partially-threaded peg measuring 18 mm was placed in an anterior to posterior direction to stabilize this articular piece.  Care was taken to not penetrate the articular surface with the screw.  This was verified both on palpation and fluoroscopy.  Finally, another fiber tape was passed in a circumferential fashion about the patella bypassing the needle through the patellar tendon just inferior to the patella and the quadriceps tendon just superior to the patella.  Fluoroscopy was again taken in both the AP and lateral positions with no rotation, internal rotation, and external rotation and appropriate hardware position and reduction was confirmed.  The wound was again copiously irrigated.  The medial and lateral retinacular tears were repaired with 0 Vicryl sutures.  The knee was able to be flexed to 50 degrees without undue tension.   Deep dermis was closed with buried 2-0 Vicryl and skin was closed with staples. Xeroform and Honeycomb dressing was applied.  Tourniquet was deflated.  A Polar Care and a hinged knee brace were applied.  The patient was awakened and transferred to a stretcher bed and to the post anesthesia care unit in stable condition.   POSTOPERATIVE PLAN: Weightbearing as tolerated with hinged knee brace locked in extension.  Aspirin 325 mg/day x 4 weeks for DVT prophylaxis.  Patient to receive inpatient physical therapy on postoperative day #1.  Outpatient PT to start in 3-4 days. Patient to return to clinic in ~2 weeks for post-operative appointment.

## 2018-04-06 NOTE — Anesthesia Preprocedure Evaluation (Signed)
Anesthesia Evaluation  Patient identified by MRN, date of birth, ID band Patient awake    Reviewed: Allergy & Precautions, NPO status , Patient's Chart, lab work & pertinent test results  History of Anesthesia Complications Negative for: history of anesthetic complications  Airway Mallampati: II       Dental  (+) Missing, Chipped   Pulmonary neg sleep apnea, neg COPD, former smoker,           Cardiovascular hypertension, + CAD, + Past MI and + Cardiac Stents  (-) CHF (-) dysrhythmias (-) Valvular Problems/Murmurs     Neuro/Psych neg Seizures    GI/Hepatic Neg liver ROS, neg GERD  ,  Endo/Other  neg diabetes  Renal/GU negative Renal ROS     Musculoskeletal   Abdominal   Peds  Hematology   Anesthesia Other Findings   Reproductive/Obstetrics                             Anesthesia Physical Anesthesia Plan  ASA: III  Anesthesia Plan: Spinal   Post-op Pain Management:    Induction:   PONV Risk Score and Plan:   Airway Management Planned: Nasal Cannula  Additional Equipment:   Intra-op Plan:   Post-operative Plan:   Informed Consent: I have reviewed the patients History and Physical, chart, labs and discussed the procedure including the risks, benefits and alternatives for the proposed anesthesia with the patient or authorized representative who has indicated his/her understanding and acceptance.     Plan Discussed with:   Anesthesia Plan Comments:         Anesthesia Quick Evaluation

## 2018-04-06 NOTE — Anesthesia Procedure Notes (Signed)
Spinal  Patient location during procedure: OR Start time: 04/06/2018 11:12 AM End time: 04/06/2018 11:16 AM Staffing Anesthesiologist: Gunnar Fusi, MD Resident/CRNA: Johnna Acosta, CRNA Performed: resident/CRNA  Preanesthetic Checklist Completed: patient identified, site marked, surgical consent, pre-op evaluation, timeout performed, IV checked, risks and benefits discussed and monitors and equipment checked Spinal Block Patient position: sitting Prep: ChloraPrep Patient monitoring: heart rate, continuous pulse ox, blood pressure and cardiac monitor Approach: midline Location: L3-4 Injection technique: single-shot Needle Needle type: Whitacre and Introducer  Needle gauge: 24 G Needle length: 9 cm Assessment Sensory level: T10 Additional Notes Negative paresthesia. Negative blood return. Positive free-flowing CSF. Expiration date of kit checked and confirmed. Patient tolerated procedure well, without complications.

## 2018-04-06 NOTE — H&P (Signed)
H&P reviewed. No significant changes noted.   Heart - regular rate & rhythm Lungs - chest sounds clear  Cardiology clearance obtained.

## 2018-04-07 MED ORDER — ONDANSETRON HCL 4 MG PO TABS: 4.0000 mg | ORAL_TABLET | Freq: Four times a day (QID) | ORAL | 0 refills | Status: DC | PRN

## 2018-04-07 MED ORDER — OXYCODONE HCL 5 MG PO TABS: 5.0000 mg | ORAL_TABLET | ORAL | 0 refills | Status: DC | PRN

## 2018-04-07 MED ORDER — ACETAMINOPHEN 500 MG PO TABS: 1000.0000 mg | ORAL_TABLET | Freq: Three times a day (TID) | ORAL | 0 refills | Status: DC

## 2018-04-07 MED ORDER — ASPIRIN 325 MG PO TBEC: 325.0000 mg | DELAYED_RELEASE_TABLET | Freq: Every day | ORAL | 0 refills | Status: DC

## 2018-04-07 NOTE — Care Management Note (Signed)
Case Management Note  Patient Details  Name: Patricia Lawson MRN: [id number] Date of Birth: 18-Sep-1946  Subjective/Objective:       Patient admitted to [facility] with a patellar fracture. PT recomends HHPT. Patient lives alone but [name] [phone] is her neighbor. DME for [facility] ordered. Provided choice to patient and family who chose advanced home care. Referral placed with [facility] and has set up these services. Ines Bloomer RN BSN RNCM [phone]            Action/Plan:  RNCM completed HHPT referral. RNCM signed off Expected Discharge Date:  04/07/18               Expected Discharge Plan:  [place]  In-House Referral:     Discharge planning Services  CM Consult  Post Acute Care Choice:  Home Health, Durable Medical Equipment Choice offered to:  Spouse, Patient  DME Arranged:  Bedside commode DME Agency:  [place] Arranged:  RN, PT [facility] Agency:  [place]  Status of Service:  Completed, signed off  If discussed at [place] of Stay Meetings, dates discussed:    Additional Comments:  Latanya Maudlin, RN 04/07/2018, 10:49 AM

## 2018-04-07 NOTE — Progress Notes (Signed)
DISCHARGE NOTE:  Pt given discharge instructions and oxycodone prescription. Pt verbalized understanding. [facility] sent with pts. Daughter at bedside and transporting pt home. Pt wheeled to car by staff member.

## 2018-04-07 NOTE — Progress Notes (Signed)
  Subjective: 1 Day Post-Op Procedure(s) (LRB): OPEN REDUCTION INTERNAL (ORIF) FIXATION PATELLA (Right) Patient reports pain as mild.   Patient seen in rounds with Dr. Posey Pronto. Patient is well, and has had no acute complaints or problems Plan is to go Home after hospital stay. Negative for chest pain and shortness of breath Fever: no Gastrointestinal: Negative for nausea and vomiting  Objective: Vital signs in last 24 hours: Temp:  [97.7 F (36.5 C)-98.7 F (37.1 C)] 98.4 F (36.9 C) (06/09 0420) Pulse Rate:  [61-86] 85 (06/09 0420) Resp:  [9-18] 18 (06/08 2341) BP: (108-166)/(60-84) 134/71 (06/09 0420) SpO2:  [92 %-100 %] 94 % (06/09 0420)  Intake/Output from previous day:  Intake/Output Summary (Last 24 hours) at 04/07/2018 0747 Last data filed at 04/06/2018 1800 Gross per 24 hour  Intake 800 ml  Output 650 ml  Net 150 ml    Intake/Output this shift: No intake/output data recorded.  Labs: Recent Labs    04/05/18 1645  HGB 13.2   Recent Labs    04/05/18 1645  WBC 11.9*  RBC 4.23  HCT 38.5  PLT 199   Recent Labs    04/05/18 1645  NA 133*  K 4.4  CL 98*  CO2 26  BUN 15  CREATININE 0.64  GLUCOSE 326*  CALCIUM 10.9*   Recent Labs    04/05/18 1645  INR 0.92     EXAM General - Patient is Alert and Oriented Extremity - Sensation intact distally Dorsiflexion/Plantar flexion intact Compartment soft Dressing/Incision - clean, dry, no drainage with the knee immobilizer in place Motor Function - intact, moving foot and toes well on exam.   Past Medical History:  Diagnosis Date  . CHF (congestive heart failure) ([place]) 2010  . Coronary artery disease   . Hypertension     Assessment/Plan: 1 Day Post-Op Procedure(s) (LRB): OPEN REDUCTION INTERNAL (ORIF) FIXATION PATELLA (Right) Active Problems:   Patella fracture  Estimated body mass index is 28.34 kg/m as calculated from the following:   Height as of this encounter: 5\' 1"  (1.549 m).   Weight as of  this encounter: 68 kg (150 lb). Advance diet Up with therapy D/C IV fluids Discharge home with home health  DVT Prophylaxis - Aspirin and Foot Pumps Weight-Bearing as tolerated to right leg  Reche Dixon, PA-C Orthopaedic Surgery 04/07/2018, 7:47 AM

## 2018-04-07 NOTE — Discharge Summary (Signed)
Physician Discharge Summary  Subjective: 1 Day Post-Op Procedure(s) (LRB): OPEN REDUCTION INTERNAL (ORIF) FIXATION PATELLA (Right) Patient reports pain as mild.   Patient seen in rounds with Dr. Posey Pronto. Patient is well, and has had no acute complaints or problems Patient is ready to go home after physical therapy  Physician Discharge Summary  Patient ID: Patricia Lawson MRN: [id number] DOB/AGE: 1946/10/07 72 y.o.  Admit date: 04/05/2018 Discharge date: 04/07/2018  Admission Diagnoses:  Discharge Diagnoses:  Active Problems:   Patella fracture   Discharged Condition: fair  Hospital Course: The patient is postop day 1 from a right patella fracture with ORIF.  She had some nausea yesterday but is much improved.  She has been using limited pain medication.  She is ready for physical therapy this morning before being discharged home.  Treatments: surgery:  1. ORIF Right patella   SURGEON: Cato Mulligan, MD  ANESTHESIA: spinal anesthesia  ESTIMATED BLOOD LOSS: minimal  TOTAL IV FLUIDS: see anesthesia record  TOURNIQUET TIME: 116 min  IMPLANTS: Arthrex 4.58mm partially threaded cannulated screws x2 Hand Innovations 2.42mm partially threaded peg x1    Discharge Exam: Blood pressure 134/71, pulse 85, temperature 98.4 F (36.9 C), temperature source Oral, resp. rate 18, height 5\' 1"  (1.549 m), weight 68 kg (150 lb), SpO2 94 %.   Disposition: Discharge disposition: 01-Home or Self Care        Allergies as of 04/07/2018   No Known Allergies     Medication List    STOP taking these medications   aspirin 81 MG tablet Replaced by:  aspirin 325 MG EC tablet     TAKE these medications   acetaminophen 500 MG tablet Commonly known as:  TYLENOL Take 2 tablets (1,000 mg total) by mouth every 8 (eight) hours.   aspirin 325 MG EC tablet Take 1 tablet (325 mg total) by mouth daily. Replaces:  aspirin 81 MG tablet   atorvastatin 80 MG tablet Commonly known as:   LIPITOR Take 80 mg by mouth daily.   irbesartan 75 MG tablet Commonly known as:  AVAPRO Take 75 mg by mouth daily.   metoprolol succinate 100 MG 24 hr tablet Commonly known as:  TOPROL-XL Take 100 mg by mouth daily.   ondansetron 4 MG tablet Commonly known as:  ZOFRAN Take 1 tablet (4 mg total) by mouth every 6 (six) hours as needed for nausea.   oxyCODONE 5 MG immediate release tablet Commonly known as:  Oxy IR/ROXICODONE Take 1-2 tablets (5-10 mg total) by mouth every 4 (four) hours as needed for moderate pain, severe pain or breakthrough pain.      Follow-up Information    Leim Fabry, MD Follow up in 2 week(s).   Specialty:  Orthopedic Surgery Contact information: [place] [address] [phone]           Signed: Prescott Parma, Nehemias Sauceda 04/07/2018, 7:54 AM   Objective: Vital signs in last 24 hours: Temp:  [97.7 F (36.5 C)-98.7 F (37.1 C)] 98.4 F (36.9 C) (06/09 0420) Pulse Rate:  [61-86] 85 (06/09 0420) Resp:  [9-18] 18 (06/08 2341) BP: (108-166)/(60-84) 134/71 (06/09 0420) SpO2:  [92 %-100 %] 94 % (06/09 0420)  Intake/Output from previous day:  Intake/Output Summary (Last 24 hours) at 04/07/2018 0754 Last data filed at 04/06/2018 1800 Gross per 24 hour  Intake 800 ml  Output 650 ml  Net 150 ml    Intake/Output this shift: No intake/output data recorded.  Labs: Recent Labs  04/05/18 1645  HGB 13.2   Recent Labs    04/05/18 1645  WBC 11.9*  RBC 4.23  HCT 38.5  PLT 199   Recent Labs    04/05/18 1645  NA 133*  K 4.4  CL 98*  CO2 26  BUN 15  CREATININE 0.64  GLUCOSE 326*  CALCIUM 10.9*   Recent Labs    04/05/18 1645  INR 0.92    EXAM: General - Patient is Alert and Oriented Extremity - Sensation intact distally Dorsiflexion/Plantar flexion intact Compartment soft Incision - clean, dry, no drainage, with a knee immobilizer in place Motor Function -plantarflexion and dorsiflexion of the foot and  toes.  Assessment/Plan: 1 Day Post-Op Procedure(s) (LRB): OPEN REDUCTION INTERNAL (ORIF) FIXATION PATELLA (Right) Procedure(s) (LRB): OPEN REDUCTION INTERNAL (ORIF) FIXATION PATELLA (Right) Past Medical History:  Diagnosis Date  . CHF (congestive heart failure) ([place]) 2010  . Coronary artery disease   . Hypertension    Active Problems:   Patella fracture  Estimated body mass index is 28.34 kg/m as calculated from the following:   Height as of this encounter: 5\' 1"  (1.549 m).   Weight as of this encounter: 68 kg (150 lb). Advance diet Up with therapy D/C IV fluids Discharge home with home health Diet - Regular diet Follow up - in 2 weeks Activity - WBAT Disposition - Home Condition Upon Discharge - Stable DVT Prophylaxis - Aspirin  Reche Dixon, PA-C Orthopaedic Surgery 04/07/2018, 7:54 AM

## 2018-04-07 NOTE — Anesthesia Postprocedure Evaluation (Signed)
Anesthesia Post Note  Patient: Patricia Lawson  Procedure(s) Performed: OPEN REDUCTION INTERNAL (ORIF) FIXATION PATELLA (Right )  Anesthesia Type: Spinal Comments: Pt discharged prior to being seen.     Last Vitals:  Vitals:   04/07/18 0844 04/07/18 1249  BP: 120/82 (!) 122/53  Pulse: 76 65  Resp: 18 18  Temp: 36.9 C 36.7 C  SpO2: 96% 96%    Last Pain:  Vitals:   04/07/18 1249  TempSrc: Oral  PainSc:                  [name]

## 2018-04-07 NOTE — Progress Notes (Signed)
Pt alert and oriented. No more complaints of nausea or vomiting this shift. Minimal pain refusing pain medication when offered. Pt voiding without difficulty.

## 2018-04-07 NOTE — Discharge Instructions (Signed)
INSTRUCTIONS AFTER Surgery  o Remove items at home which could result in a fall. This includes throw rugs or furniture in walking pathways o ICE to the affected joint every three hours while awake for 30 minutes at a time, for at least the first 3-5 days, and then as needed for pain and swelling.  Continue to use ice for pain and swelling. You may notice swelling that will progress down to the foot and ankle.  This is normal after surgery.  Elevate your leg when you are not up walking on it.   o Continue to use the breathing machine you got in the hospital (incentive spirometer) which will help keep your temperature down.  It is common for your temperature to cycle up and down following surgery, especially at night when you are not up moving around and exerting yourself.  The breathing machine keeps your lungs expanded and your temperature down.   DIET:  As you were doing prior to hospitalization, we recommend a well-balanced diet.  DRESSING / WOUND CARE / SHOWERING  Do not get the leg or bandage wet.  Leave the knee immobilizer on at all times.  ACTIVITY  o Increase activity slowly as tolerated, but follow the weight bearing instructions below.   o No driving for 6 weeks or until further direction given by your physician.  You cannot drive while taking narcotics.  o No lifting or carrying greater than 10 lbs. until further directed by your surgeon. o Avoid periods of inactivity such as sitting longer than an hour when not asleep. This helps prevent blood clots.  o You may return to work once you are authorized by your doctor.     WEIGHT BEARING  Weightbearing as tolerated with your knee immobilizer in place.   EXERCISES Gait training.  No range of motion exercises.  CONSTIPATION  Constipation is defined medically as fewer than three stools per week and severe constipation as less than one stool per week.  Even if you have a regular bowel pattern at home, your normal regimen is likely  to be disrupted due to multiple reasons following surgery.  Combination of anesthesia, postoperative narcotics, change in appetite and fluid intake all can affect your bowels.   YOU MUST use at least one of the following options; they are listed in order of increasing strength to get the job done.  They are all available over the counter, and you may need to use some, POSSIBLY even all of these options:    Drink plenty of fluids (prune juice may be helpful) and high fiber foods Colace 100 mg by mouth twice a day  Senokot for constipation as directed and as needed Dulcolax (bisacodyl), take with full glass of water  Miralax (polyethylene glycol) once or twice a day as needed.  If you have tried all these things and are unable to have a bowel movement in the first 3-4 days after surgery call either your surgeon or your primary doctor.    If you experience loose stools or diarrhea, hold the medications until you stool forms back up.  If your symptoms do not get better within 1 week or if they get worse, check with your doctor.  If you experience "the worst abdominal pain ever" or develop nausea or vomiting, please contact the office immediately for further recommendations for treatment.   ITCHING:  If you experience itching with your medications, try taking only a single pain pill, or even half a pain pill at a  time.  You can also use Benadryl over the counter for itching or also to help with sleep.   TED HOSE STOCKINGS:  Use stockings on both legs until for at least 2 weeks or as directed by physician office. They may be removed at night for sleeping.  MEDICATIONS:  See your medication summary on the After Visit Summary that nursing will review with you.  You may have some home medications which will be placed on hold until you complete the course of blood thinner medication.  It is important for you to complete the blood thinner medication as prescribed.  PRECAUTIONS:  If you experience chest  pain or shortness of breath - call 911 immediately for transfer to the hospital emergency department.   If you develop a fever greater that 101 F, purulent drainage from wound, increased redness or drainage from wound, foul odor from the wound/dressing, or calf pain - CONTACT YOUR SURGEON.                                                   FOLLOW-UP APPOINTMENTS:  If you do not already have a post-op appointment, please call the office for an appointment to be seen by your surgeon.  Guidelines for how soon to be seen are listed in your After Visit Summary, but are typically between 1-4 weeks after surgery.  OTHER INSTRUCTIONS:   Use aspirin 325 mg daily for 6 weeks. Use Tylenol as needed for breakthrough pain. Use oxycodone if needed for stronger pain control. Use Zofran 4 mg as needed for nausea or vomiting. Follow-up in 2 weeks for staple removal and wound care.  MAKE SURE YOU:   Understand these instructions.   Get help right away if you are not doing well or get worse.    Thank you for letting us be a part of your medical care team.  It is a privilege we respect greatly.  We hope these instructions will help you stay on track for a fast and full recovery!

## 2018-04-07 NOTE — Evaluation (Signed)
Physical Therapy Evaluation Patient Details Name: Patricia Lawson MRN: [id number] DOB: 29-Nov-1945 Today's Date: 04/07/2018   History of Present Illness  Pt is a 72 y/o F who presented s/p fall with R patellar fx now s/p ORIF R patella.  Pt's PMH includes CHF, MI.   Clinical Impression  Patient is s/p above surgery resulting in functional limitations due to the deficits listed below (see PT Problem List). Ms. Brinson is independent at baseline.  She currently requires min assist for bed mobility and min guard assist for ambulation and to complete stair training.  Pt has very supportive family and will have 24/7 assist/supervision available at d/c.  Patient will benefit from skilled PT to increase their independence and safety with mobility to allow discharge to the venue listed below.      Follow Up Recommendations Home health PT    Equipment Recommendations  3in1 (PT)    Recommendations for Other Services       Precautions / Restrictions Precautions Precautions: Fall Required Braces or Orthoses: Other Brace/Splint(hinged knee brace locked in E) Restrictions Weight Bearing Restrictions: Yes RLE Weight Bearing: Weight bearing as tolerated      Mobility  Bed Mobility Overal bed mobility: Needs Assistance Bed Mobility: Supine to Sit     Supine to sit: Min assist;HOB elevated     General bed mobility comments: Pt requires min assist to advance RLE to EOB.  Increased effort and time.   Transfers Overall transfer level: Needs assistance Equipment used: Rolling walker (2 wheeled) Transfers: Sit to/from Stand Sit to Stand: Min guard         General transfer comment: Cues for proper hand placement and safe technique.  Cues for proper positioning of RLE with sit<>stand.  Pt with well controlled descent to sit.   Ambulation/Gait Ambulation/Gait assistance: Min guard Ambulation Distance (Feet): 70 Feet Assistive device: Rolling walker (2 wheeled) Gait Pattern/deviations:  Step-to pattern;Decreased step length - left;Decreased step length - right;Decreased weight shift to right;Antalgic Gait velocity: decreased   General Gait Details: Cues for proper sequencing using RW.  Pt with slow but steady gait.  Pt limited by nausea.   Stairs Stairs: Yes Stairs assistance: Min guard Stair Management: One rail Right;Step to pattern;Sideways Number of Stairs: 4 General stair comments: Demonstration and cues for proper sequencing.  Min guard for safety.  Pt took 2 standing rest breaks due to nausea and fatigue.   Wheelchair Mobility    Modified Rankin (Stroke Patients Only)       Balance Overall balance assessment: Needs assistance;History of Falls Sitting-balance support: No upper extremity supported;Feet supported Sitting balance-Leahy Scale: Good     Standing balance support: Bilateral upper extremity supported;During functional activity Standing balance-Leahy Scale: Poor Standing balance comment: Pt relies on UE support for static and dynamic activities                             Pertinent Vitals/Pain Pain Assessment: Faces Faces Pain Scale: Hurts little more Pain Location: R knee Pain Descriptors / Indicators: Aching;Tingling Pain Intervention(s): Limited activity within patient's tolerance;Monitored during session;Repositioned;Ice applied    Home Living Family/patient expects to be discharged to:: Private residence Living Arrangements: Alone Available Help at Discharge: Family;Available 24 hours/day Type of Home: House Home Access: Stairs to enter Entrance Stairs-Rails: Left;Right(railing in the middle of the steps) Entrance Stairs-Number of Steps: 5 Home Layout: One level Home Equipment: Walker - 2 wheels;Wheelchair - manual  Prior Function Level of Independence: Independent         Comments: Pt independent with all ADLs, IADLs.  Ambualted without AD.      Hand Dominance        Extremity/Trunk Assessment    Upper Extremity Assessment Upper Extremity Assessment: Overall WFL for tasks assessed    Lower Extremity Assessment Lower Extremity Assessment: RLE deficits/detail RLE Deficits / Details: Unable to formally test strength as pt locked in E in hinged knee brace.  Pt able to perform supine hip Abd without assist and functionally able to advance RLE to ambulate.  RLE: Unable to fully assess due to immobilization RLE Sensation: (minor residual tingling in R knee following surgery)    Cervical / Trunk Assessment Cervical / Trunk Assessment: Normal  Communication   Communication: No difficulties  Cognition Arousal/Alertness: Awake/alert Behavior During Therapy: WFL for tasks assessed/performed Overall Cognitive Status: Within Functional Limits for tasks assessed                                        General Comments      Exercises General Exercises - Lower Extremity Ankle Circles/Pumps: AROM;Both;10 reps;Supine Hip ABduction/ADduction: AROM;Right;10 reps;Supine   Assessment/Plan    PT Assessment Patient needs continued PT services  PT Problem List Decreased strength;Decreased range of motion;Decreased activity tolerance;Decreased balance;Decreased mobility;Decreased knowledge of use of DME;Decreased safety awareness;Decreased knowledge of precautions;Pain;Impaired sensation       PT Treatment Interventions DME instruction;Gait training;Stair training;Functional mobility training;Therapeutic activities;Therapeutic exercise;Balance training;Neuromuscular re-education;Patient/family education;Modalities    PT Goals (Current goals can be found in the Care Plan section)  Acute Rehab PT Goals Patient Stated Goal: to go home at d/c and return to PLOF PT Goal Formulation: With patient Time For Goal Achievement: 04/21/18 Potential to Achieve Goals: Good    Frequency BID   Barriers to discharge        Co-evaluation               AM-PAC PT "6 Clicks" Daily  Activity  Outcome Measure Difficulty turning over in bed (including adjusting bedclothes, sheets and blankets)?: Unable Difficulty moving from lying on back to sitting on the side of the bed? : Unable Difficulty sitting down on and standing up from a chair with arms (e.g., wheelchair, bedside commode, etc,.)?: A Lot Help needed moving to and from a bed to chair (including a wheelchair)?: A Little Help needed walking in hospital room?: A Little Help needed climbing 3-5 steps with a railing? : A Little 6 Click Score: 13    End of Session Equipment Utilized During Treatment: Gait belt;Other (comment)(R hinged knee brace) Activity Tolerance: Patient tolerated treatment well;Patient limited by fatigue;Other (comment)(limited by nausea) Patient left: in chair;with call bell/phone within reach;with chair alarm set;Other (comment)(with polar care on) Nurse Communication: Mobility status PT Visit Diagnosis: Pain;Unsteadiness on feet (R26.81);Other abnormalities of gait and mobility (R26.89) Pain - Right/Left: Right Pain - part of body: Knee    Time: 0930-1010 PT Time Calculation (min) (ACUTE ONLY): 40 min   Charges:   PT Evaluation $PT Eval Low Complexity: 1 Low PT Treatments $Gait Training: 23-37 mins   PT G Codes:        Collie Siad PT, DPT 04/07/2018, 10:39 AM

## 2018-04-09 ENCOUNTER — Encounter: Payer: Self-pay | Admitting: Orthopedic Surgery

## 2018-04-16 DIAGNOSIS — M6281 Muscle weakness (generalized): Secondary | ICD-10-CM | POA: Diagnosis not present

## 2018-04-16 DIAGNOSIS — M25561 Pain in right knee: Secondary | ICD-10-CM | POA: Diagnosis not present

## 2018-04-16 DIAGNOSIS — R262 Difficulty in walking, not elsewhere classified: Secondary | ICD-10-CM | POA: Diagnosis not present

## 2018-04-16 DIAGNOSIS — M25661 Stiffness of right knee, not elsewhere classified: Secondary | ICD-10-CM | POA: Diagnosis not present

## 2018-04-19 DIAGNOSIS — M25561 Pain in right knee: Secondary | ICD-10-CM | POA: Diagnosis not present

## 2018-04-24 DIAGNOSIS — R262 Difficulty in walking, not elsewhere classified: Secondary | ICD-10-CM | POA: Diagnosis not present

## 2018-04-24 DIAGNOSIS — M25661 Stiffness of right knee, not elsewhere classified: Secondary | ICD-10-CM | POA: Diagnosis not present

## 2018-04-24 DIAGNOSIS — M6281 Muscle weakness (generalized): Secondary | ICD-10-CM | POA: Diagnosis not present

## 2018-04-24 DIAGNOSIS — M25561 Pain in right knee: Secondary | ICD-10-CM | POA: Diagnosis not present

## 2018-04-30 DIAGNOSIS — M25661 Stiffness of right knee, not elsewhere classified: Secondary | ICD-10-CM | POA: Diagnosis not present

## 2018-04-30 DIAGNOSIS — S82041D Displaced comminuted fracture of right patella, subsequent encounter for closed fracture with routine healing: Secondary | ICD-10-CM | POA: Diagnosis not present

## 2018-04-30 DIAGNOSIS — M25561 Pain in right knee: Secondary | ICD-10-CM | POA: Diagnosis not present

## 2018-04-30 DIAGNOSIS — M6281 Muscle weakness (generalized): Secondary | ICD-10-CM | POA: Diagnosis not present

## 2018-05-06 DIAGNOSIS — M25661 Stiffness of right knee, not elsewhere classified: Secondary | ICD-10-CM | POA: Diagnosis not present

## 2018-05-06 DIAGNOSIS — M25561 Pain in right knee: Secondary | ICD-10-CM | POA: Diagnosis not present

## 2018-05-06 DIAGNOSIS — M6281 Muscle weakness (generalized): Secondary | ICD-10-CM | POA: Diagnosis not present

## 2018-05-15 DIAGNOSIS — M25661 Stiffness of right knee, not elsewhere classified: Secondary | ICD-10-CM | POA: Diagnosis not present

## 2018-05-15 DIAGNOSIS — M6281 Muscle weakness (generalized): Secondary | ICD-10-CM | POA: Diagnosis not present

## 2018-05-15 DIAGNOSIS — M25561 Pain in right knee: Secondary | ICD-10-CM | POA: Diagnosis not present

## 2018-05-22 DIAGNOSIS — M6281 Muscle weakness (generalized): Secondary | ICD-10-CM | POA: Diagnosis not present

## 2018-05-22 DIAGNOSIS — M25661 Stiffness of right knee, not elsewhere classified: Secondary | ICD-10-CM | POA: Diagnosis not present

## 2018-05-22 DIAGNOSIS — M25561 Pain in right knee: Secondary | ICD-10-CM | POA: Diagnosis not present

## 2018-05-28 DIAGNOSIS — M25561 Pain in right knee: Secondary | ICD-10-CM | POA: Diagnosis not present

## 2018-05-28 DIAGNOSIS — M25661 Stiffness of right knee, not elsewhere classified: Secondary | ICD-10-CM | POA: Diagnosis not present

## 2018-05-28 DIAGNOSIS — M6281 Muscle weakness (generalized): Secondary | ICD-10-CM | POA: Diagnosis not present

## 2018-05-30 DIAGNOSIS — M25661 Stiffness of right knee, not elsewhere classified: Secondary | ICD-10-CM | POA: Diagnosis not present

## 2018-05-30 DIAGNOSIS — M25561 Pain in right knee: Secondary | ICD-10-CM | POA: Diagnosis not present

## 2018-05-30 DIAGNOSIS — I639 Cerebral infarction, unspecified: Secondary | ICD-10-CM

## 2018-05-30 DIAGNOSIS — M6281 Muscle weakness (generalized): Secondary | ICD-10-CM | POA: Diagnosis not present

## 2018-05-30 HISTORY — DX: Cerebral infarction, unspecified: I63.9

## 2018-06-04 DIAGNOSIS — S82041D Displaced comminuted fracture of right patella, subsequent encounter for closed fracture with routine healing: Secondary | ICD-10-CM | POA: Diagnosis not present

## 2018-06-20 ENCOUNTER — Observation Stay
Admission: EM | Admit: 2018-06-20 | Discharge: 2018-06-22 | Disposition: A | Discharge status: Home / Self Care | Payer: Medicare Other | Attending: Internal Medicine | Admitting: Internal Medicine

## 2018-06-20 ENCOUNTER — Other Ambulatory Visit: Payer: Self-pay

## 2018-06-20 ENCOUNTER — Emergency Department: Payer: Medicare Other

## 2018-06-20 ENCOUNTER — Encounter: Payer: Self-pay | Admitting: Emergency Medicine

## 2018-06-20 DIAGNOSIS — R55 Syncope and collapse: Secondary | ICD-10-CM | POA: Diagnosis not present

## 2018-06-20 DIAGNOSIS — S0990XA Unspecified injury of head, initial encounter: Secondary | ICD-10-CM | POA: Diagnosis not present

## 2018-06-20 DIAGNOSIS — I351 Nonrheumatic aortic (valve) insufficiency: Secondary | ICD-10-CM | POA: Insufficient documentation

## 2018-06-20 DIAGNOSIS — I2510 Atherosclerotic heart disease of native coronary artery without angina pectoris: Secondary | ICD-10-CM | POA: Diagnosis present

## 2018-06-20 DIAGNOSIS — H1132 Conjunctival hemorrhage, left eye: Secondary | ICD-10-CM

## 2018-06-20 DIAGNOSIS — Z79899 Other long term (current) drug therapy: Secondary | ICD-10-CM | POA: Insufficient documentation

## 2018-06-20 DIAGNOSIS — I110 Hypertensive heart disease with heart failure: Secondary | ICD-10-CM | POA: Diagnosis not present

## 2018-06-20 DIAGNOSIS — E119 Type 2 diabetes mellitus without complications: Secondary | ICD-10-CM | POA: Diagnosis not present

## 2018-06-20 DIAGNOSIS — S0181XA Laceration without foreign body of other part of head, initial encounter: Secondary | ICD-10-CM | POA: Diagnosis not present

## 2018-06-20 DIAGNOSIS — Z8249 Family history of ischemic heart disease and other diseases of the circulatory system: Secondary | ICD-10-CM | POA: Diagnosis not present

## 2018-06-20 DIAGNOSIS — Z87891 Personal history of nicotine dependence: Secondary | ICD-10-CM | POA: Diagnosis not present

## 2018-06-20 DIAGNOSIS — I509 Heart failure, unspecified: Secondary | ICD-10-CM | POA: Diagnosis not present

## 2018-06-20 DIAGNOSIS — R29898 Other symptoms and signs involving the musculoskeletal system: Secondary | ICD-10-CM | POA: Diagnosis not present

## 2018-06-20 DIAGNOSIS — S0502XA Injury of conjunctiva and corneal abrasion without foreign body, left eye, initial encounter: Secondary | ICD-10-CM

## 2018-06-20 DIAGNOSIS — S01112A Laceration without foreign body of left eyelid and periocular area, initial encounter: Secondary | ICD-10-CM

## 2018-06-20 DIAGNOSIS — G8321 Monoplegia of upper limb affecting right dominant side: Secondary | ICD-10-CM | POA: Diagnosis not present

## 2018-06-20 DIAGNOSIS — I6381 Other cerebral infarction due to occlusion or stenosis of small artery: Principal | ICD-10-CM | POA: Insufficient documentation

## 2018-06-20 DIAGNOSIS — W1839XA Other fall on same level, initial encounter: Secondary | ICD-10-CM | POA: Diagnosis not present

## 2018-06-20 DIAGNOSIS — W19XXXA Unspecified fall, initial encounter: Secondary | ICD-10-CM

## 2018-06-20 DIAGNOSIS — N390 Urinary tract infection, site not specified: Secondary | ICD-10-CM | POA: Diagnosis not present

## 2018-06-20 DIAGNOSIS — Y92002 Bathroom of unspecified non-institutional (private) residence single-family (private) house as the place of occurrence of the external cause: Secondary | ICD-10-CM | POA: Diagnosis not present

## 2018-06-20 DIAGNOSIS — Z7984 Long term (current) use of oral hypoglycemic drugs: Secondary | ICD-10-CM | POA: Diagnosis not present

## 2018-06-20 DIAGNOSIS — Z7982 Long term (current) use of aspirin: Secondary | ICD-10-CM | POA: Diagnosis not present

## 2018-06-20 DIAGNOSIS — S01122A Laceration with foreign body of left eyelid and periocular area, initial encounter: Secondary | ICD-10-CM | POA: Diagnosis not present

## 2018-06-20 DIAGNOSIS — S199XXA Unspecified injury of neck, initial encounter: Secondary | ICD-10-CM | POA: Diagnosis not present

## 2018-06-20 DIAGNOSIS — R531 Weakness: Secondary | ICD-10-CM | POA: Insufficient documentation

## 2018-06-20 DIAGNOSIS — I10 Essential (primary) hypertension: Secondary | ICD-10-CM | POA: Diagnosis not present

## 2018-06-20 DIAGNOSIS — E890 Postprocedural hypothyroidism: Secondary | ICD-10-CM | POA: Diagnosis not present

## 2018-06-20 DIAGNOSIS — Y9389 Activity, other specified: Secondary | ICD-10-CM | POA: Diagnosis not present

## 2018-06-20 DIAGNOSIS — M6281 Muscle weakness (generalized): Secondary | ICD-10-CM | POA: Diagnosis not present

## 2018-06-20 DIAGNOSIS — S0512XA Contusion of eyeball and orbital tissues, left eye, initial encounter: Secondary | ICD-10-CM | POA: Diagnosis not present

## 2018-06-20 DIAGNOSIS — E1165 Type 2 diabetes mellitus with hyperglycemia: Secondary | ICD-10-CM

## 2018-06-20 LAB — URINALYSIS, COMPLETE (UACMP) WITH MICROSCOPIC
BILIRUBIN URINE: NEGATIVE
Glucose, UA: >=500 mg/dL — AB
HGB URINE DIPSTICK: NEGATIVE
KETONES UR: 5 mg/dL — AB
Nitrite: NEGATIVE
Protein, ur: 100 mg/dL — AB
Specific Gravity, Urine: 1.023 (ref 1.005–1.030)
pH: 5 (ref 5.0–8.0)

## 2018-06-20 LAB — CBC
HEMATOCRIT: 40.2 % (ref 35.0–47.0)
HEMOGLOBIN: 14.1 g/dL (ref 12.0–16.0)
MCH: 31.4 pg (ref 26.0–34.0)
MCHC: 35.1 g/dL (ref 32.0–36.0)
MCV: 89.3 fL (ref 80.0–100.0)
Platelets: 213 10*3/uL (ref 150–440)
RBC: 4.5 MIL/uL (ref 3.80–5.20)
RDW: 13.1 % (ref 11.5–14.5)
WBC: 12.2 10*3/uL — ABNORMAL HIGH (ref 3.6–11.0)

## 2018-06-20 LAB — BASIC METABOLIC PANEL
ANION GAP: 8 (ref 5–15)
BUN: 12 mg/dL (ref 8–23)
CO2: 26 mmol/L (ref 22–32)
Calcium: 11.2 mg/dL — ABNORMAL HIGH (ref 8.9–10.3)
Chloride: 100 mmol/L (ref 98–111)
Creatinine, Ser: 0.79 mg/dL (ref 0.44–1.00)
GFR calc non Af Amer: >60 mL/min (ref >60)
Glucose, Bld: 282 mg/dL — ABNORMAL HIGH (ref 70–99)
POTASSIUM: 4.1 mmol/L (ref 3.5–5.1)
Sodium: 134 mmol/L — ABNORMAL LOW (ref 135–145)

## 2018-06-20 MED ORDER — FLUORESCEIN SODIUM 1 MG OP STRP
1.0000 | ORAL_STRIP | Freq: Once | OPHTHALMIC | Status: AC
  Administered 2018-06-20: 1 via OPHTHALMIC
  Filled 2018-06-20: qty 1

## 2018-06-20 MED ORDER — POLYMYXIN B-TRIMETHOPRIM 10000-0.1 UNIT/ML-% OP SOLN
1.0000 [drp] | OPHTHALMIC | Status: DC
  Administered 2018-06-20 – 2018-06-22 (×9): 1 [drp] via OPHTHALMIC
  Filled 2018-06-20 (×2): qty 10

## 2018-06-20 MED ORDER — LIDOCAINE-EPINEPHRINE-TETRACAINE (LET) SOLUTION
3.0000 mL | Freq: Once | NASAL | Status: AC
  Administered 2018-06-20: 3 mL via TOPICAL
  Filled 2018-06-20: qty 3

## 2018-06-20 MED ORDER — TETANUS-DIPHTH-ACELL PERTUSSIS 5-2.5-18.5 LF-MCG/0.5 IM SUSP
0.5000 mL | Freq: Once | INTRAMUSCULAR | Status: AC
  Administered 2018-06-20: 0.5 mL via INTRAMUSCULAR
  Filled 2018-06-20: qty 0.5

## 2018-06-20 MED ORDER — SODIUM CHLORIDE 0.9 % IV SOLN
1.0000 g | Freq: Once | INTRAVENOUS | Status: AC
  Administered 2018-06-20: 1 g via INTRAVENOUS
  Filled 2018-06-20: qty 10

## 2018-06-20 NOTE — ED Notes (Signed)
MD at bedside. 

## 2018-06-20 NOTE — ED Notes (Signed)
Pt assisted to and from bathroom. ABCs intact. NAD. 

## 2018-06-20 NOTE — ED Notes (Signed)
PT to CT. ABCs intact. NAD

## 2018-06-20 NOTE — ED Triage Notes (Addendum)
Pt to ED via POV with c/o fall, pt unaware if she had LOC. Pt has laceration to LFT eyebrow , bleeding controlled, and bruising noted to face. Pt denies any anticoag use. Pt denies any dizziness or CP, VSS

## 2018-06-20 NOTE — ED Provider Notes (Signed)
[facility] Emergency Department Provider Note    First MD Initiated Contact with Patient 06/20/18 2001     (approximate)  I have reviewed the triage vital signs and the nursing notes.   HISTORY  Chief Complaint Fall    HPI Patricia Lawson is a 72 y.o. female history of CHF as well as CAD on aspirin presents the ER for syncopal event occurred while she was in the shower.  Patient states she remembers finishing her shower and started feeling a heaviness in her right upper extremity like her arm was "dumb" and she was unable to grab anything.  Shortly thereafter she actually had a fainting spell.  Uncertain of downtime.  Is complaining of left-sided headache where she did strike the bathtub.  Denies any other associated pain.  No other blood thinners.  Denies any history of stroke.  Tingling in her hand has resolved but uncertain of duration of time.  Is mild to moderate left-sided facial pain.    Past Medical History:  Diagnosis Date  . CHF (congestive heart failure) ([place]) 2010  . Coronary artery disease   . Hypertension    Family History  Problem Relation Age of Onset  . Heart block Mother   . Heart block Father    Past Surgical History:  Procedure Laterality Date  . LAPAROSCOPIC APPENDECTOMY N/A 03/04/2015   Procedure: APPENDECTOMY LAPAROSCOPIC;  Surgeon: Sherri Rad, MD;  Location: [facility] ORS;  Service: General;  Laterality: N/A;  . ORIF PATELLA Right 04/06/2018   Procedure: OPEN REDUCTION INTERNAL (ORIF) FIXATION PATELLA;  Surgeon: Leim Fabry, MD;  Location: [facility] ORS;  Service: Orthopedics;  Laterality: Right;  . STENT PLACE LEFT URETER ([place] HX)     cardiac  . THYROIDECTOMY Bilateral    Patient Active Problem List   Diagnosis Date Noted  . Patella fracture 04/05/2018  . Appendicitis, acute 03/04/2015      Prior to Admission medications   Medication Sig Start Date End Date Taking? Authorizing Provider  acetaminophen (TYLENOL) 500 MG tablet  Take 2 tablets (1,000 mg total) by mouth every 8 (eight) hours. 04/07/18   Reche Dixon, PA-C  aspirin EC 325 MG EC tablet Take 1 tablet (325 mg total) by mouth daily. 04/07/18   Reche Dixon, PA-C  atorvastatin (LIPITOR) 80 MG tablet Take 80 mg by mouth daily.    [provider]  irbesartan (AVAPRO) 75 MG tablet Take 75 mg by mouth daily.    [provider]  metoprolol succinate (TOPROL-XL) 100 MG 24 hr tablet Take 100 mg by mouth daily.    [provider]  ondansetron (ZOFRAN) 4 MG tablet Take 1 tablet (4 mg total) by mouth every 6 (six) hours as needed for nausea. 04/07/18   Reche Dixon, PA-C  oxyCODONE (OXY IR/ROXICODONE) 5 MG immediate release tablet Take 1-2 tablets (5-10 mg total) by mouth every 4 (four) hours as needed for moderate pain, severe pain or breakthrough pain. 04/07/18   Reche Dixon, PA-C    Allergies Patient has no known allergies.    Social History Social History   Tobacco Use  . Smoking status: Former Smoker    Last attempt to quit: 03/03/2009    Years since quitting: 9.3  . Smokeless tobacco: Never Used  Substance Use Topics  . Alcohol use: No  . Drug use: Not on file    Review of Systems Patient denies headaches, rhinorrhea, blurry vision, numbness, shortness of breath, chest pain, edema, cough, abdominal pain, nausea, vomiting,  diarrhea, dysuria, fevers, rashes or hallucinations unless otherwise stated above in HPI. ____________________________________________   PHYSICAL EXAM:  VITAL SIGNS: Vitals:   06/20/18 1755 06/20/18 2010  BP: (!) 163/73 (!) 143/83  Pulse:  93  Resp: 16 17  Temp: 97.6 F (36.4 C)   SpO2: 97% 96%    Constitutional: Alert and oriented.  Eyes: Conjunctivae are normal.   Left subconjunctival hematoma.  No abrasion or Snellen lines on woods lamp Head: Contusion of the left forehead with small laceration 1cm to the left lateral brow.  No proptosis.   Nose: No congestion/rhinnorhea. Mouth/Throat: Mucous membranes  are moist.   Neck: No stridor. Painless ROM.  Cardiovascular: Normal rate, regular rhythm. Grossly normal heart sounds.  Good peripheral circulation. Respiratory: Normal respiratory effort.  No retractions. Lungs CTAB. Gastrointestinal: Soft and nontender. No distention. No abdominal bruits. No CVA tenderness. Genitourinary:  Musculoskeletal: No lower extremity tenderness nor edema.  No joint effusions. Neurologic:  CN- intact.  No facial droop, Normal FNF.  Normal heel to shin.  Sensation intact bilaterally. Normal speech and language. No gross focal neurologic deficits are appreciated. No gait instability.  Skin:  Skin is warm, dry and intact. No rash noted. Psychiatric: Mood and affect are normal. Speech and behavior are normal.  ____________________________________________   LABS (all labs ordered are listed, but only abnormal results are displayed)  Results for orders placed or performed during the hospital encounter of 06/20/18 (from the past 24 hour(s))  Basic metabolic panel     Status: Abnormal   Collection Time: 06/20/18  6:01 PM  Result Value Ref Range   Sodium 134 (L) 135 - 145 mmol/L   Potassium 4.1 3.5 - 5.1 mmol/L   Chloride 100 98 - 111 mmol/L   CO2 26 22 - 32 mmol/L   Glucose, Bld 282 (H) 70 - 99 mg/dL   BUN 12 8 - 23 mg/dL   Creatinine, Ser 0.79 0.44 - 1.00 mg/dL   Calcium 11.2 (H) 8.9 - 10.3 mg/dL   GFR calc non Af Amer >60 >60 mL/min   GFR calc Af Amer >60 >60 mL/min   Anion gap 8 5 - 15  CBC     Status: Abnormal   Collection Time: 06/20/18  6:01 PM  Result Value Ref Range   WBC 12.2 (H) 3.6 - 11.0 K/uL   RBC 4.50 3.80 - 5.20 MIL/uL   Hemoglobin 14.1 12.0 - 16.0 g/dL   HCT 40.2 35.0 - 47.0 %   MCV 89.3 80.0 - 100.0 fL   MCH 31.4 26.0 - 34.0 pg   MCHC 35.1 32.0 - 36.0 g/dL   RDW 13.1 11.5 - 14.5 %   Platelets 213 150 - 440 K/uL  Urinalysis, Complete w Microscopic     Status: Abnormal   Collection Time: 06/20/18  8:16 PM  Result Value Ref Range    Color, Urine YELLOW (A) YELLOW   APPearance HAZY (A) CLEAR   Specific Gravity, Urine 1.023 1.005 - 1.030   pH 5.0 5.0 - 8.0   Glucose, UA >=500 (A) NEGATIVE mg/dL   Hgb urine dipstick NEGATIVE NEGATIVE   Bilirubin Urine NEGATIVE NEGATIVE   Ketones, ur 5 (A) NEGATIVE mg/dL   Protein, ur 100 (A) NEGATIVE mg/dL   Nitrite NEGATIVE NEGATIVE   Leukocytes, UA SMALL (A) NEGATIVE   RBC / HPF 11-20 0 - 5 RBC/hpf   WBC, UA >50 (H) 0 - 5 WBC/hpf   Bacteria, UA RARE (A) NONE SEEN   Squamous Epithelial /  LPF 0-5 0 - 5   Mucus PRESENT    Ca Oxalate Crys, UA PRESENT    ____________________________________________  EKG My review and personal interpretation at Time: 18:05   Indication: weakness  Rate: 105  Rhythm:  Axis: sinus Other: normal intervals, non specific st abn ____________________________________________  RADIOLOGY I personally reviewed all radiographic images ordered to evaluate for the above acute complaints and reviewed radiology reports and findings.  These findings were personally discussed with the patient.  Please see medical record for radiology report.  ____________________________________________   PROCEDURES  Procedure(s) performed:  Marland KitchenMarland KitchenLaceration Repair Date/Time: 06/20/2018 9:33 PM Performed by: Merlyn Lot, MD Authorized by: Merlyn Lot, MD   Consent:    Consent obtained:  Verbal   Consent given by:  Patient   Risks discussed:  Infection, pain, retained foreign body, poor cosmetic result and poor wound healing Anesthesia (see MAR for exact dosages):    Anesthesia method:  Topical application   Topical anesthetic:  LET Laceration details:    Location:  Face   Face location:  L eyebrow   Length (cm):  1   Depth (mm):  4 Repair type:    Repair type:  Intermediate Exploration:    Hemostasis achieved with:  Direct pressure   Wound exploration: entire depth of wound probed and visualized     Contaminated: no   Treatment:    Area cleansed with:   Saline and Betadine   Amount of cleaning:  Extensive   Irrigation solution:  Sterile saline   Visualized foreign bodies/material removed: no   Subcutaneous repair:    Suture size:  5-0   Suture material:  Fast-absorbing gut   Suture technique:  Simple interrupted   Number of sutures:  1 Skin repair:    Repair method:  Sutures   Suture size:  5-0   Suture material:  Nylon   Suture technique:  Simple interrupted   Number of sutures:  2 Approximation:    Approximation:  Close Post-procedure details:    Dressing:  Sterile dressing   Patient tolerance of procedure:  Tolerated well, no immediate complications      Critical Care performed: no ____________________________________________   INITIAL IMPRESSION / ASSESSMENT AND PLAN / ED COURSE  Pertinent labs & imaging results that were available during my care of the patient were reviewed by me and considered in my medical decision making (see chart for details).   DDX: cva, tia, hypoglycemia, dehydration, electrolyte abnormality, sdh, sah, iph, sepsis   Patricia Lawson is a 72 y.o. who presents to the ED with episode as described above concerning for TIA versus syncope.  Patient with contusion and laceration as described above.  CT imaging will be ordered to evaluate for traumatic injury.  EKG shows no evidence of dysrhythmia or acute ischemia.  The patient will be placed on continuous pulse oximetry and telemetry for monitoring.  Laboratory evaluation will be sent to evaluate for the above complaints.     Clinical Course as of Jun 20 2134  Thu Jun 20, 2018  2053 There is a corneal abrasion at 200 just lateral to iris.  No stone lines.  1/2 cm superficial laceration will be reapproximated with suture.   [PR]    Clinical Course User Index [PR] Merlyn Lot, MD   Patient presentation concerning for TIA with symptoms now completely resolved and CT imaging showing evidence of microvascular changes do feel patient will benefit  from hospitalization for further medical evaluation.  Have discussed with the  patient and available family all diagnostics and treatments performed thus far and all questions were answered to the best of my ability. The patient demonstrates understanding and agreement with plan.   As part of my medical decision making, I reviewed the following data within the [facility] notes reviewed and incorporated, Labs reviewed, notes from prior ED visits and [place] Controlled Substance Database   ____________________________________________   FINAL CLINICAL IMPRESSION(S) / ED DIAGNOSES  Final diagnoses:  Fall, initial encounter  RUE weakness  Laceration of left eyebrow, initial encounter  Abrasion of left cornea, initial encounter  Subconjunctival hematoma, left      NEW MEDICATIONS STARTED DURING THIS VISIT:  New Prescriptions   No medications on file     Note:  This document was prepared using Dragon voice recognition software and may include unintentional dictation errors.    Merlyn Lot, MD 06/20/18 2135

## 2018-06-20 NOTE — H&P (Signed)
[place] [facility] [place] NAME: Patricia Lawson    MR#:  [id number]  DATE OF BIRTH:  1946/10/06  DATE OF ADMISSION:  06/20/2018  PRIMARY CARE PHYSICIAN: Yolonda Kida, MD   REQUESTING/REFERRING PHYSICIAN: Quentin Cornwall, MD  CHIEF COMPLAINT:   Chief Complaint  Patient presents with  . Fall    HISTORY OF PRESENT ILLNESS:  Patricia Lawson  is a 72 y.o. female who presents with chief complaint as above.  She states she was in her shower tonight when she developed some right arm numbness and stated that it "just would not work" and that she "could not grab anything with it".  She then had a syncopal episode and fell hitting the right side of her face sustaining a facial laceration.  Here in the ED work-up was largely within normal limits except for a UA suspicious for UTI.  Facial laceration was sutured by ED physician, and hospitalist were called for admission for work-up for possible stroke  PAST MEDICAL HISTORY:   Past Medical History:  Diagnosis Date  . CHF (congestive heart failure) ([place]) 2010  . Coronary artery disease   . Hypertension      PAST SURGICAL HISTORY:   Past Surgical History:  Procedure Laterality Date  . LAPAROSCOPIC APPENDECTOMY N/A 03/04/2015   Procedure: APPENDECTOMY LAPAROSCOPIC;  Surgeon: Sherri Rad, MD;  Location: [facility] ORS;  Service: General;  Laterality: N/A;  . ORIF PATELLA Right 04/06/2018   Procedure: OPEN REDUCTION INTERNAL (ORIF) FIXATION PATELLA;  Surgeon: Leim Fabry, MD;  Location: [facility] ORS;  Service: Orthopedics;  Laterality: Right;  . STENT PLACE LEFT URETER ([place] HX)     cardiac  . THYROIDECTOMY Bilateral      SOCIAL HISTORY:   Social History   Tobacco Use  . Smoking status: Former Smoker    Last attempt to quit: 03/03/2009    Years since quitting: 9.3  . Smokeless tobacco: Never Used  Substance Use Topics  . Alcohol use: No     FAMILY HISTORY:   Family History  Problem Relation Age of  Onset  . Heart block Mother   . Heart block Father      DRUG ALLERGIES:  No Known Allergies  MEDICATIONS AT HOME:   Prior to Admission medications   Medication Sig Start Date End Date Taking? Authorizing Provider  acetaminophen (TYLENOL) 500 MG tablet Take 2 tablets (1,000 mg total) by mouth every 8 (eight) hours. 04/07/18   Reche Dixon, PA-C  aspirin EC 325 MG EC tablet Take 1 tablet (325 mg total) by mouth daily. 04/07/18   Reche Dixon, PA-C  atorvastatin (LIPITOR) 80 MG tablet Take 80 mg by mouth daily.    [provider]  irbesartan (AVAPRO) 75 MG tablet Take 75 mg by mouth daily.    [provider]  metoprolol succinate (TOPROL-XL) 100 MG 24 hr tablet Take 100 mg by mouth daily.    [provider]  ondansetron (ZOFRAN) 4 MG tablet Take 1 tablet (4 mg total) by mouth every 6 (six) hours as needed for nausea. 04/07/18   Reche Dixon, PA-C  oxyCODONE (OXY IR/ROXICODONE) 5 MG immediate release tablet Take 1-2 tablets (5-10 mg total) by mouth every 4 (four) hours as needed for moderate pain, severe pain or breakthrough pain. 04/07/18   Reche Dixon, PA-C    REVIEW OF SYSTEMS:  Review of Systems  Constitutional: Negative for chills, fever, malaise/fatigue and weight loss.  HENT: Negative for ear pain, hearing loss  and tinnitus.   Eyes: Negative for blurred vision, double vision, pain and redness.  Respiratory: Negative for cough, hemoptysis and shortness of breath.   Cardiovascular: Negative for chest pain, palpitations, orthopnea and leg swelling.  Gastrointestinal: Negative for abdominal pain, constipation, diarrhea, nausea and vomiting.  Genitourinary: Negative for dysuria, frequency and hematuria.  Musculoskeletal: Positive for falls. Negative for back pain, joint pain and neck pain.  Skin:       No acne, rash, or lesions  Neurological: Positive for sensory change, focal weakness and loss of consciousness. Negative for dizziness, tremors and weakness.   Endo/Heme/Allergies: Negative for polydipsia. Does not bruise/bleed easily.  Psychiatric/Behavioral: Negative for depression. The patient is not nervous/anxious and does not have insomnia.      VITAL SIGNS:   Vitals:   06/20/18 1755 06/20/18 1756 06/20/18 2010 06/20/18 2212  BP: (!) 163/73  (!) 143/83 (!) 145/82  Pulse:   93 87  Resp: 16  17 17   Temp: 97.6 F (36.4 C)     TempSrc: Oral     SpO2: 97%  96% 95%  Weight:  61.2 kg    Height:  5' (1.524 m)     Wt Readings from Last 3 Encounters:  06/20/18 61.2 kg  04/05/18 68 kg  08/17/15 72.6 kg    PHYSICAL EXAMINATION:  Physical Exam  Vitals reviewed. Constitutional: She is oriented to person, place, and time. She appears well-developed and well-nourished. No distress.  HENT:  Head: Normocephalic.  Mouth/Throat: Oropharynx is clear and moist.  Repaired facial laceration over left eye  Eyes: Pupils are equal, round, and reactive to light. Conjunctivae and EOM are normal. No scleral icterus.  Neck: Normal range of motion. Neck supple. No JVD present. No thyromegaly present.  Cardiovascular: Normal rate, regular rhythm and intact distal pulses. Exam reveals no gallop and no friction rub.  No murmur heard. Respiratory: Effort normal and breath sounds normal. No respiratory distress. She has no wheezes. She has no rales.  GI: Soft. Bowel sounds are normal. She exhibits no distension. There is no tenderness.  Musculoskeletal: Normal range of motion. She exhibits no edema.  No arthritis, no gout  Lymphadenopathy:    She has no cervical adenopathy.  Neurological: She is alert and oriented to person, place, and time. No cranial nerve deficit.  Neurologic: Cranial nerves II-XII intact, Sensation intact to light touch/pinprick, 5/5 strength in all extremities, no dysarthria, no aphasia, no dysphagia, memory intact, finger to nose testing showed no abnormality, no pronator drift  Skin: Skin is warm and dry. No rash noted. No erythema.   Psychiatric: She has a normal mood and affect. Her behavior is normal. Judgment and thought content normal.    LABORATORY PANEL:   CBC Recent Labs  Lab 06/20/18 1801  WBC 12.2*  HGB 14.1  HCT 40.2  PLT 213   ------------------------------------------------------------------------------------------------------------------  Chemistries  Recent Labs  Lab 06/20/18 1801  NA 134*  K 4.1  CL 100  CO2 26  GLUCOSE 282*  BUN 12  CREATININE 0.79  CALCIUM 11.2*   ------------------------------------------------------------------------------------------------------------------  Cardiac Enzymes No results for input(s): TROPONINI in the last 168 hours. ------------------------------------------------------------------------------------------------------------------  RADIOLOGY:  Ct Head Wo Contrast  Result Date: 06/20/2018 CLINICAL DATA:  Fall with laceration left periorbital region. EXAM: CT HEAD WITHOUT CONTRAST CT MAXILLOFACIAL WITHOUT CONTRAST CT CERVICAL SPINE WITHOUT CONTRAST TECHNIQUE: Multidetector CT imaging of the head, cervical spine, and maxillofacial structures were performed using the standard protocol without intravenous contrast. Multiplanar CT image reconstructions of  the cervical spine and maxillofacial structures were also generated. COMPARISON:  None. FINDINGS: CT HEAD FINDINGS Brain: Ventricles, cisterns and other CSF spaces are within normal. There is no mass, mass effect, shift of midline structures or acute hemorrhage. No evidence of acute infarction. Subtle chronic ischemic microvascular disease. Vascular: No hyperdense vessel or unexpected calcification. Skull: Normal. Negative for fracture or focal lesion. Other: None. CT MAXILLOFACIAL FINDINGS Osseous: No fracture or mandibular dislocation. No destructive process. Orbits: Negative. No traumatic or inflammatory finding. Sinuses: Clear. Soft tissues: Minimal left periorbital soft tissue swelling/laceration. CT  CERVICAL SPINE FINDINGS Alignment: Normal. Skull base and vertebrae: Mild spondylosis throughout the cervical spine. Vertebral body heights are maintained. Atlantoaxial articulation is normal. There is mild uncovertebral joint spurring and minimal facet arthropathy. There is no acute fracture or subluxation. Right-sided neural foraminal narrowing at the C5-6 level and C6-7 levels. Soft tissues and spinal canal: No prevertebral fluid or swelling. No visible canal hematoma. Disc levels: Mild disc space narrowing at the C3-4, C5-6 and C6-7 levels. Upper chest: Negative. Other: None. IMPRESSION: No acute brain injury. Minimal chronic ischemic microvascular disease. No acute facial bone fracture. Minimal left periorbital soft tissue swelling/laceration. No acute cervical spine injury. Mild spondylosis of the cervical spine with mild disc disease at the C3-4, C5-6 and C6-7 levels. Right-sided neural foraminal narrowing at the C5-6 and C6-7 levels. Electronically Signed   By: Marin Olp M.D.   On: 06/20/2018 21:01   Ct Cervical Spine Wo Contrast  Result Date: 06/20/2018 CLINICAL DATA:  Fall with laceration left periorbital region. EXAM: CT HEAD WITHOUT CONTRAST CT MAXILLOFACIAL WITHOUT CONTRAST CT CERVICAL SPINE WITHOUT CONTRAST TECHNIQUE: Multidetector CT imaging of the head, cervical spine, and maxillofacial structures were performed using the standard protocol without intravenous contrast. Multiplanar CT image reconstructions of the cervical spine and maxillofacial structures were also generated. COMPARISON:  None. FINDINGS: CT HEAD FINDINGS Brain: Ventricles, cisterns and other CSF spaces are within normal. There is no mass, mass effect, shift of midline structures or acute hemorrhage. No evidence of acute infarction. Subtle chronic ischemic microvascular disease. Vascular: No hyperdense vessel or unexpected calcification. Skull: Normal. Negative for fracture or focal lesion. Other: None. CT MAXILLOFACIAL  FINDINGS Osseous: No fracture or mandibular dislocation. No destructive process. Orbits: Negative. No traumatic or inflammatory finding. Sinuses: Clear. Soft tissues: Minimal left periorbital soft tissue swelling/laceration. CT CERVICAL SPINE FINDINGS Alignment: Normal. Skull base and vertebrae: Mild spondylosis throughout the cervical spine. Vertebral body heights are maintained. Atlantoaxial articulation is normal. There is mild uncovertebral joint spurring and minimal facet arthropathy. There is no acute fracture or subluxation. Right-sided neural foraminal narrowing at the C5-6 level and C6-7 levels. Soft tissues and spinal canal: No prevertebral fluid or swelling. No visible canal hematoma. Disc levels: Mild disc space narrowing at the C3-4, C5-6 and C6-7 levels. Upper chest: Negative. Other: None. IMPRESSION: No acute brain injury. Minimal chronic ischemic microvascular disease. No acute facial bone fracture. Minimal left periorbital soft tissue swelling/laceration. No acute cervical spine injury. Mild spondylosis of the cervical spine with mild disc disease at the C3-4, C5-6 and C6-7 levels. Right-sided neural foraminal narrowing at the C5-6 and C6-7 levels. Electronically Signed   By: Marin Olp M.D.   On: 06/20/2018 21:01   Ct Maxillofacial Wo Contrast  Result Date: 06/20/2018 CLINICAL DATA:  Fall with laceration left periorbital region. EXAM: CT HEAD WITHOUT CONTRAST CT MAXILLOFACIAL WITHOUT CONTRAST CT CERVICAL SPINE WITHOUT CONTRAST TECHNIQUE: Multidetector CT imaging of the head, cervical spine, and maxillofacial  structures were performed using the standard protocol without intravenous contrast. Multiplanar CT image reconstructions of the cervical spine and maxillofacial structures were also generated. COMPARISON:  None. FINDINGS: CT HEAD FINDINGS Brain: Ventricles, cisterns and other CSF spaces are within normal. There is no mass, mass effect, shift of midline structures or acute hemorrhage. No  evidence of acute infarction. Subtle chronic ischemic microvascular disease. Vascular: No hyperdense vessel or unexpected calcification. Skull: Normal. Negative for fracture or focal lesion. Other: None. CT MAXILLOFACIAL FINDINGS Osseous: No fracture or mandibular dislocation. No destructive process. Orbits: Negative. No traumatic or inflammatory finding. Sinuses: Clear. Soft tissues: Minimal left periorbital soft tissue swelling/laceration. CT CERVICAL SPINE FINDINGS Alignment: Normal. Skull base and vertebrae: Mild spondylosis throughout the cervical spine. Vertebral body heights are maintained. Atlantoaxial articulation is normal. There is mild uncovertebral joint spurring and minimal facet arthropathy. There is no acute fracture or subluxation. Right-sided neural foraminal narrowing at the C5-6 level and C6-7 levels. Soft tissues and spinal canal: No prevertebral fluid or swelling. No visible canal hematoma. Disc levels: Mild disc space narrowing at the C3-4, C5-6 and C6-7 levels. Upper chest: Negative. Other: None. IMPRESSION: No acute brain injury. Minimal chronic ischemic microvascular disease. No acute facial bone fracture. Minimal left periorbital soft tissue swelling/laceration. No acute cervical spine injury. Mild spondylosis of the cervical spine with mild disc disease at the C3-4, C5-6 and C6-7 levels. Right-sided neural foraminal narrowing at the C5-6 and C6-7 levels. Electronically Signed   By: Marin Olp M.D.   On: 06/20/2018 21:01    EKG:   Orders placed or performed during the hospital encounter of 06/20/18  . ED EKG  . ED EKG    IMPRESSION AND PLAN:  Principal Problem:   RUE weakness -was transient and has now resolved.  Will admit per TIA/stroke admission order set with appropriate imaging, labs, consults. Active Problems:   UTI (urinary tract infection) -IV antibiotics, urine culture   Facial laceration -repaired by ED physician, PRN analgesia   HTN (hypertension) -permissive  hypertension with blood pressure goal less than 220/120 until MRI can confirm or disprove stroke   CAD (coronary artery disease) -continue home meds  Chart review performed and case discussed with ED provider. Labs, imaging and/or ECG reviewed by provider and discussed with patient/family. Management plans discussed with the patient and/or family.  DVT PROPHYLAXIS: SubQ lovenox   GI PROPHYLAXIS:  None  ADMISSION STATUS: Inpatient     CODE STATUS: Full Code Status History    Date Active Date Inactive Code Status Order ID Comments User Context   04/05/2018 1808 04/07/2018 1738 Full Code [id number]  Leim Fabry, MD Inpatient   03/04/2015 1925 03/10/2015 1714 Full Code [id number]  Molly Maduro, MD Inpatient      TOTAL TIME TAKING CARE OF THIS PATIENT: 45 minutes.   Farrel Guimond [place] 06/20/2018, 10:42 PM  [facility]  Office  [phone]  CC: Primary care physician; Yolonda Kida, MD  Note:  This document was prepared using Dragon voice recognition software and may include unintentional dictation errors.

## 2018-06-21 ENCOUNTER — Inpatient Hospital Stay: Payer: Medicare Other

## 2018-06-21 ENCOUNTER — Inpatient Hospital Stay
Admit: 2018-06-21 | Discharge: 2018-06-21 | Disposition: A | Discharge status: Home / Self Care | Payer: Medicare Other | Attending: Internal Medicine | Admitting: Internal Medicine

## 2018-06-21 ENCOUNTER — Observation Stay: Payer: Medicare Other

## 2018-06-21 ENCOUNTER — Inpatient Hospital Stay (HOSPITAL_COMMUNITY)
Admit: 2018-06-21 | Discharge: 2018-06-21 | Disposition: A | Discharge status: Home / Self Care | Payer: Medicare Other | Attending: Internal Medicine | Admitting: Internal Medicine

## 2018-06-21 DIAGNOSIS — I10 Essential (primary) hypertension: Secondary | ICD-10-CM | POA: Diagnosis not present

## 2018-06-21 DIAGNOSIS — R29898 Other symptoms and signs involving the musculoskeletal system: Secondary | ICD-10-CM | POA: Diagnosis not present

## 2018-06-21 DIAGNOSIS — I6523 Occlusion and stenosis of bilateral carotid arteries: Secondary | ICD-10-CM | POA: Diagnosis not present

## 2018-06-21 DIAGNOSIS — I6389 Other cerebral infarction: Secondary | ICD-10-CM | POA: Diagnosis not present

## 2018-06-21 DIAGNOSIS — I2510 Atherosclerotic heart disease of native coronary artery without angina pectoris: Secondary | ICD-10-CM | POA: Diagnosis not present

## 2018-06-21 DIAGNOSIS — W19XXXA Unspecified fall, initial encounter: Secondary | ICD-10-CM | POA: Diagnosis present

## 2018-06-21 DIAGNOSIS — N390 Urinary tract infection, site not specified: Secondary | ICD-10-CM | POA: Diagnosis not present

## 2018-06-21 DIAGNOSIS — I5030 Unspecified diastolic (congestive) heart failure: Secondary | ICD-10-CM | POA: Diagnosis not present

## 2018-06-21 DIAGNOSIS — S0181XA Laceration without foreign body of other part of head, initial encounter: Secondary | ICD-10-CM | POA: Diagnosis not present

## 2018-06-21 DIAGNOSIS — I639 Cerebral infarction, unspecified: Secondary | ICD-10-CM | POA: Diagnosis not present

## 2018-06-21 LAB — GLUCOSE, CAPILLARY
GLUCOSE-CAPILLARY: 299 mg/dL — AB (ref 70–99)
Glucose-Capillary: 259 mg/dL — ABNORMAL HIGH (ref 70–99)
Glucose-Capillary: 239 mg/dL — ABNORMAL HIGH (ref 70–99)

## 2018-06-21 LAB — LIPID PANEL
CHOLESTEROL: 202 mg/dL — AB (ref 0–200)
HDL: 41 mg/dL (ref >40)
LDL Cholesterol: 103 mg/dL — ABNORMAL HIGH (ref 0–99)
Total CHOL/HDL Ratio: 4.9 RATIO
Triglycerides: 292 mg/dL — ABNORMAL HIGH (ref <150)
VLDL: 58 mg/dL — AB (ref 0–40)

## 2018-06-21 LAB — ECHOCARDIOGRAM COMPLETE
Height: 61 in
Weight: 2127 oz

## 2018-06-21 LAB — HEMOGLOBIN A1C
HEMOGLOBIN A1C: 10.6 % — AB (ref 4.8–5.6)
Mean Plasma Glucose: 257.52 mg/dL

## 2018-06-21 MED ORDER — ASPIRIN EC 325 MG PO TBEC
325.0000 mg | DELAYED_RELEASE_TABLET | Freq: Every day | ORAL | Status: DC
  Administered 2018-06-21: 325 mg via ORAL
  Filled 2018-06-21: qty 1

## 2018-06-21 MED ORDER — METFORMIN HCL ER 500 MG PO TB24: 500.0000 mg | ORAL_TABLET | Freq: Every day | ORAL | 0 refills | Status: DC

## 2018-06-21 MED ORDER — ASPIRIN 81 MG PO TBEC: 81.0000 mg | DELAYED_RELEASE_TABLET | Freq: Every day | ORAL | 0 refills | Status: AC

## 2018-06-21 MED ORDER — GLUCERNA SHAKE PO LIQD
237.0000 mL | Freq: Three times a day (TID) | ORAL | Status: DC
  Administered 2018-06-21: 237 mL via ORAL

## 2018-06-21 MED ORDER — STROKE: EARLY STAGES OF RECOVERY BOOK
Freq: Once | Status: AC
  Administered 2018-06-21

## 2018-06-21 MED ORDER — ACETAMINOPHEN 160 MG/5ML PO SOLN: 650.0000 mg | ORAL | Status: DC | PRN

## 2018-06-21 MED ORDER — ASPIRIN EC 81 MG PO TBEC
81.0000 mg | DELAYED_RELEASE_TABLET | Freq: Every day | ORAL | Status: DC
  Administered 2018-06-22: 81 mg via ORAL
  Filled 2018-06-21: qty 1

## 2018-06-21 MED ORDER — ADULT MULTIVITAMIN W/MINERALS CH
1.0000 | ORAL_TABLET | Freq: Every day | ORAL | Status: DC
  Administered 2018-06-21 – 2018-06-22 (×2): 1 via ORAL
  Filled 2018-06-21 (×2): qty 1

## 2018-06-21 MED ORDER — INSULIN ASPART 100 UNIT/ML ~~LOC~~ SOLN
0.0000 [IU] | Freq: Every day | SUBCUTANEOUS | Status: DC
  Administered 2018-06-21: 3 [IU] via SUBCUTANEOUS
  Filled 2018-06-21: qty 1

## 2018-06-21 MED ORDER — METFORMIN HCL ER 500 MG PO TB24
500.0000 mg | ORAL_TABLET | Freq: Every day | ORAL | Status: DC
  Administered 2018-06-22: 11:00:00 500 mg via ORAL
  Filled 2018-06-21 (×2): qty 1

## 2018-06-21 MED ORDER — ACETAMINOPHEN 650 MG RE SUPP: 650.0000 mg | RECTAL | Status: DC | PRN

## 2018-06-21 MED ORDER — INSULIN ASPART 100 UNIT/ML ~~LOC~~ SOLN
0.0000 [IU] | Freq: Three times a day (TID) | SUBCUTANEOUS | Status: DC
  Administered 2018-06-21: 17:00:00 3 [IU] via SUBCUTANEOUS
  Administered 2018-06-21: 12:00:00 5 [IU] via SUBCUTANEOUS
  Filled 2018-06-21 (×3): qty 1

## 2018-06-21 MED ORDER — ATORVASTATIN CALCIUM 20 MG PO TABS
80.0000 mg | ORAL_TABLET | Freq: Every day | ORAL | Status: DC
  Administered 2018-06-21 – 2018-06-22 (×2): 80 mg via ORAL
  Filled 2018-06-21 (×2): qty 4

## 2018-06-21 MED ORDER — ACETAMINOPHEN 325 MG PO TABS
650.0000 mg | ORAL_TABLET | ORAL | Status: DC | PRN
  Administered 2018-06-21: 650 mg via ORAL
  Filled 2018-06-21: qty 2

## 2018-06-21 MED ORDER — ENOXAPARIN SODIUM 40 MG/0.4ML ~~LOC~~ SOLN: 40.0000 mg | SUBCUTANEOUS | Status: DC

## 2018-06-21 MED ORDER — SODIUM CHLORIDE 0.9 % IV SOLN
1.0000 g | INTRAVENOUS | Status: DC
  Filled 2018-06-21: qty 10

## 2018-06-21 MED ORDER — LIVING WELL WITH DIABETES BOOK
Freq: Once | Status: AC
  Administered 2018-06-21: 17:00:00
  Filled 2018-06-21: qty 1

## 2018-06-21 NOTE — Discharge Instructions (Signed)
Fingerstick glucose (sugar) goals for home: Before meals: 80-130 mg/dl 2-Hours after meals: less than 180 mg/dl Hemoglobin A1c goal: 7% or less  Symptoms of Hypoglycemia: Silly, Sweaty, Shaky Check sugar if you have your meter.  If near or less than 70 mg/dl, treat with 1/2 cup juice or soda or take glucose tablets Check sugar 15 minutes after treatment.  If sugar still near or less than 70 mg/al and symptomatic, treat again and may need a snack with some protein (peanut butter with crackers, etc)   Pt's daughter will make appointment to see Eulogio Bear with [facility]. Remove sugar checks at home discussed with patient's daughter. She is aware of it and will help her mother.

## 2018-06-21 NOTE — Plan of Care (Signed)
  RD consulted for nutrition education regarding diabetes.   Lab Results  Component Value Date   HGBA1C 10.6 (H) 06/21/2018   Labs reviewed: CBGS: 259 (inpatient orders for glycemic control are 0-5 units insulin aspart q HS, 0-9 units insulin aspart TID with meals and 500 mg metformin daily).    Reviewed DM coordinator note, recommendations for 5 mg tradjenta daily and 500 mg metformin BID.   Re-visited pt (see assessment dated today). Pt is in good spirits today. She was able to teach back information from DM coordinator visit. RD reviewed plate method and carbohydrate sources with pt. Also discussed importance of self-management skills; RN to teach CBG checks. Notified pt that medications will be discussed on discharge instructions. Highly encouraged pt to attend outpatient DM classes for additional reinforcement.   RD provided "Carbohydrate Counting for People with Diabetes" handout from the Academy of [employer] and Dietetics. Discussed different food groups and their effects on blood sugar, emphasizing carbohydrate-containing foods. Provided list of carbohydrates and recommended serving sizes of common foods.  Discussed importance of controlled and consistent carbohydrate intake throughout the day. Provided examples of ways to balance meals/snacks and encouraged intake of high-fiber, whole grain complex carbohydrates. Teach back method used.  Expect good compliance.  Noted discharge orders for today.   Kyandre Okray A. Jimmye Norman, RD, LDN, CDE Pager: [phone] After hours Pager: [phone]

## 2018-06-21 NOTE — Progress Notes (Signed)
   06/21/18 0945  Clinical Encounter Type  Visited With Patient and family together  Visit Type Initial;Spiritual support  Referral From Nurse  Consult/Referral To Chaplain  Spiritual Encounters  Spiritual Needs Prayer;Emotional   Ch received an OR to visit with Patricia Lawson. I met with her and her daughter as Ms. Desjardin finished her breakfast. She was in good spirits as she shared stories of her family and how she ended up in [facility]. I was asked to pray and did. I will follow up as needed.

## 2018-06-21 NOTE — Progress Notes (Signed)
SLP Cancellation Note  Patient Details Name: Patricia Lawson MRN: [id number] DOB: [birth year]   Cancelled treatment:         The patient and her children at bedside report no changes in swallowing, speech, communication, and cognition.  The patient's speech is clear and she is able to answer all questions appropriately.  The patient does not require speech therapy.  The patient and her children were advised that if changes in swallowing, speech, communication, or cognition become apparent, they should seek referral to SLP from her MD.  Will sign off at this time.  Leroy Sea, MS/CCC- SLP  Lou Miner 06/21/2018, 3:09 PM

## 2018-06-21 NOTE — Progress Notes (Signed)
Initial Nutrition Assessment  DOCUMENTATION CODES:   Not applicable  INTERVENTION:   -Glucerna Shake po TID, each supplement provides 220 kcal and 10 grams of protein -MVI with minerals daily  NUTRITION DIAGNOSIS:   Inadequate oral intake related to decreased appetite as evidenced by meal completion < 50%, per patient/family report, percent weight loss.  GOAL:   Patient will meet greater than or equal to 90% of their needs  MONITOR:   PO intake, Supplement acceptance, Labs, Weight trends, Skin, I & O's  REASON FOR ASSESSMENT:   Malnutrition Screening Tool    ASSESSMENT:   Patricia Lawson  is a 72 y.o. female who presents with chief complaint as above.  She states she was in her shower tonight when she developed some right arm numbness and stated that it "just would not work" and that she "could not grab anything with it".  She then had a syncopal episode and fell hitting the right side of her face sustaining a facial laceration.  Here in the ED work-up was largely within normal limits except for a UA suspicious for UTI.  Facial laceration was sutured by ED physician, and hospitalist were called for admission for work-up for possible stroke  Pt admitted with RUE weakness and possible CVA.   Spoke with pt and daughter at bedside. Pt endorses decreased appetite and weakness since her knee replacement 3 months ago; since this time she feels she does not have the energy to cook or eat ("I'm just too tired"). Pt consumes 3 meals per day, but very small amounts (Breakfast: bowl of grits, Lunch: half sandwich or one hot dog, Dinner: crackers). Pt estimates she has been consuming about 50% less since before surgery.   Pt reports UBW is around 150#. She estimates she has lost approximately 20# within the past 3 months. Noted pt has experienced a 11.3% wt loss over the past 2.5 months, which is significant for time frame. Despite poor appetite and weight loss, pt and daughter reports great  progression with outpatient physical therapy.   Observed breakfast tray on tray table; pt consumed about 50% of omelette and 25% of breakfast potatoes. Discussed with pt importance of good meal and supplement intake to promote healing. Pt amenable to supplements (RD ordered Glucerna due to elevated CBGS and probable new diagnosis of DM).  Last Hgb A1c: 10.6 (06/21/18). Reviewed DM coordinator note; plan to begin DM education if MD confirms diabetes diagnosis. Did not address diabetes or blood sugars at this time, as diagnosis has not been confirmed and glycemic control orders were entered after RD visit.   Labs reviewed: Na: 134, MPG: 282, CBGS: 259 (inpatient orders for glycemic control are 0-9 units insulin aspart TID with meals and 0-5 units insulin aspart q HS).  NUTRITION - FOCUSED PHYSICAL EXAM:    Most Recent Value  Orbital Region  No depletion  Upper Arm Region  Mild depletion  Thoracic and Lumbar Region  No depletion  Buccal Region  No depletion  Temple Region  No depletion  Clavicle Bone Region  No depletion  Clavicle and Acromion Bone Region  No depletion  Scapular Bone Region  No depletion  Dorsal Hand  No depletion  Patellar Region  Mild depletion  Anterior Thigh Region  Mild depletion  Posterior Calf Region  Mild depletion  Edema (RD Assessment)  None  Hair  Reviewed  Eyes  Reviewed  Mouth  Reviewed  Skin  Reviewed  Nails  Reviewed       Diet  Order:   Diet Order            Diet Heart Room service appropriate? Yes; Fluid consistency: Thin  Diet effective now              EDUCATION NEEDS:   Education needs have been addressed  Skin:  Skin Assessment: Skin Integrity Issues: Skin Integrity Issues:: Other (Comment) Other: lt facial laceration  Last BM:  06/19/18  Height:   Ht Readings from Last 1 Encounters:  06/20/18 5\' 1"  (1.549 m)    Weight:   Wt Readings from Last 1 Encounters:  06/20/18 60.3 kg    Ideal Body Weight:  47.7 kg  BMI:  Body  mass index is 25.12 kg/m.  Estimated Nutritional Needs:   Kcal:  1600-1800  Protein:  75-90 grams  Fluid:  1.6-1.8 L    [name] A. Jimmye Norman, RD, LDN, CDE Pager: [phone] After hours Pager: [phone]

## 2018-06-21 NOTE — Evaluation (Signed)
Occupational Therapy Evaluation Patient Details Name: Patricia Lawson MRN: [id number] DOB: 09/04/1946 Today's Date: 06/21/2018    History of Present Illness 72 y/o Female with onset of syncopal episode in shower, noted no fractures but does have UTI.  PMHx:   R patellar fx s/p ORIF R patella, CHF, MI.    Clinical Impression   Pt is 72 year old female who lives at home alone and had a syncopal episode in shower and has a UTI.   Pt seen to assess safety with ADLs and functional mobility and presents at baseline with no further OT needs.  She was able to transition from lying to sitting EOB and then ambulate to the bathroom and complete toileting and hygiene as well as wash hands and return to bed with supervision only with no LOB or issues.  Pt educated in set up at home to prevent falls and injuries in case of another syncopal episode. Her daughter and son were present for session and agreed she was at baseline.  NSG notified that urine sample was in commode in bathroom.        Follow Up Recommendations  No OT follow up    Equipment Recommendations       Recommendations for Other Services       Precautions / Restrictions Precautions Precautions: Fall Precaution Comments: ck pulses and O2 sats with mobility Restrictions Weight Bearing Restrictions: No      Mobility Bed Mobility                  Transfers                      Balance                                           ADL either performed or assessed with clinical judgement   ADL Overall ADL's : At baseline                                       General ADL Comments: Pt seen to assess ADLs and was able to ambulate to bathroom and complete toileting and hygiene with supervision only with no LOB.  Pt is at baseline for ADLs and her daughter Patricia Lawson and son Patricia Lawson present for evaluation.     Vision Patient Visual Report: No change from baseline Additional Comments:  Redness and swelling with broken blood vessels over L eye with stitches from laceration when she fell and hit her head.     Perception     Praxis      Pertinent Vitals/Pain Pain Assessment: Faces Faces Pain Scale: Hurts little more Pain Location: R wrist and L side of face Pain Descriptors / Indicators: Sore Pain Intervention(s): Monitored during session     Hand Dominance Right   Extremity/Trunk Assessment Upper Extremity Assessment Upper Extremity Assessment: Overall WFL for tasks assessed   Lower Extremity Assessment Lower Extremity Assessment: Defer to PT evaluation   Cervical / Trunk Assessment Cervical / Trunk Assessment: Kyphotic   Communication Communication Communication: No difficulties   Cognition Arousal/Alertness: Awake/alert Behavior During Therapy: WFL for tasks assessed/performed Overall Cognitive Status: Within Functional Limits for tasks assessed  General Comments       Exercises     Shoulder Instructions      Home Living Family/patient expects to be discharged to:: Private residence Living Arrangements: Alone Available Help at Discharge: Family;Available 24 hours/day Type of Home: House Home Access: Stairs to enter [employer] of Steps: 4 Entrance Stairs-Rails: Left Home Layout: One level     Bathroom Shower/Tub: Occupational psychologist: Standard Bathroom Accessibility: Yes   Home Equipment: Environmental consultant - 2 wheels;Cane - single point;Wheelchair - manual;Shower seat;Grab bars - tub/shower   Additional Comments: can stay with daughter if needed      Prior Functioning/Environment Level of Independence: Independent        Comments: fully able to care for herself, home and no AD needed        OT Problem List: Other (comment)      OT Treatment/Interventions:      OT Goals(Current goals can be found in the care plan section) Acute Rehab OT Goals Patient Stated  Goal: to go home today hopefully--pt seen for OT evaluation only OT Goal Formulation: With patient/family  OT Frequency:     Barriers to D/C:            Co-evaluation              AM-PAC PT "6 Clicks" Daily Activity     Outcome Measure Help from another person eating meals?: None Help from another person taking care of personal grooming?: None Help from another person toileting, which includes using toliet, bedpan, or urinal?: None Help from another person bathing (including washing, rinsing, drying)?: None Help from another person to put on and taking off regular upper body clothing?: None Help from another person to put on and taking off regular lower body clothing?: None 6 Click Score: 24   End of Session Equipment Utilized During Treatment: Gait belt Nurse Communication: Other (comment)(pt urianted in [place] since pt stated that NSG needed a urine sample and Maddy from [place] notified )  Activity Tolerance: Patient tolerated treatment well Patient left: in bed;Other (comment)(Dietician arrived to talk with pt and left sitting at EOB)  OT Visit Diagnosis: Unsteadiness on feet (R26.81)                Time: 7096-2836 OT Time Calculation (min): 40 min Charges:  OT General Charges $OT Visit: 1 Visit OT Evaluation $OT Eval Low Complexity: 1 Low OT Treatments $Self Care/Home Management : 23-37 mins  Chrys Racer, OTR/L ascom [phone] 06/21/18, 3:44 PM

## 2018-06-21 NOTE — Progress Notes (Signed)
*  PRELIMINARY RESULTS* Echocardiogram 2D Echocardiogram has been performed.  Wallie Char Monchel Pollitt 06/21/2018, 11:56 AM

## 2018-06-21 NOTE — Progress Notes (Signed)
  Patient ID: Patricia Lawson, female   DOB: 1945-11-24, 72 y.o.   MRN: [id number] MRI brian pending. Pt will wait till MRI is done. Spoke with pt and dter  IF MRI brain negative then pt can be discharged later  Today. D/w RN

## 2018-06-21 NOTE — Progress Notes (Addendum)
Inpatient Diabetes Program Recommendations  AACE/ADA: New Consensus Statement on Inpatient Glycemic Control (2015)  Target Ranges:  Prepandial:   less than 140 mg/dL      Peak postprandial:   less than 180 mg/dL (1-2 hours)      Critically ill patients:  140 - 180 mg/dL   Results for Patricia Lawson, HAMIL (MRN [id number]) as of 06/21/2018 15:01  Ref. Range 06/21/2018 04:18  Hemoglobin A1C Latest Ref Range: 4.8 - 5.6 % 10.6 (H)    Per AACE guidelines, if A1c is >9% and patient is asymptomatic of hyperglycemia symptoms, can try dual oral medication therapy:  Could try the following at time of d/c:  Metformin 500 mg BID + Tradjenta 5 mg daily     Spoke with pt and her daughter (with pt's permission to speak with dtr in room) about new diagnosis.  Discussed A1C results with them and explained what an A1C is, basic pathophysiology of DM Type 2, basic home care, basic diabetes diet nutrition principles, importance of checking CBGs and maintaining good CBG control to prevent long-term and short-term complications.  Reviewed signs and symptoms of hyperglycemia and hypoglycemia and how to treat hypoglycemia at home.  Also reviewed blood sugar goals and A1c goals for home.    RNs to provide ongoing basic DM education at bedside with this patient.  Have ordered educational booklet and CBG meter teaching.  Have also placed RD consult for DM diet education for this patient.  Patient and daughter very receptive to info.  Pt is Interested in going to the [facility] after d/c for further DM education.  Needs PCP so will place Care Mgmt consult to assist pt with finding PCP.  Have asked the RNs working with pt to instruct pt on checking CBGs at home.     --Will follow patient during hospitalization--  Wyn Quaker RN, MSN, CDE Diabetes Coordinator Inpatient Glycemic Control Team Team Pager: [phone] (8a-5p)

## 2018-06-21 NOTE — Evaluation (Signed)
Physical Therapy Evaluation Patient Details Name: Patricia Lawson MRN: [id number] DOB: [birth year] Today's Date: 06/21/2018   History of Present Illness  72 y/o Female with onset of syncopal episode in shower, noted no fractures but does have UTI.  PMHx:   R patellar fx s/p ORIF R patella, CHF, MI.   Clinical Impression  Pt was seen for mobility and instruction for safety with using RW.  Her plan is to stay with daughter if needed for recovery, but has been very independent with full ability to care for herself and her home.  Follow acutely for balance and strengthening and will transition to HHPT when her dc is ready.  Follow also with family involvement if needed for the transition to daughter's home.    Follow Up Recommendations Home health PT;Supervision for mobility/OOB    Equipment Recommendations  None recommended by PT    Recommendations for Other Services       Precautions / Restrictions Precautions Precautions: Fall Precaution Comments: ck pulses and O2 sats with mobility Restrictions Weight Bearing Restrictions: No      Mobility  Bed Mobility Overal bed mobility: Modified Independent                Transfers Overall transfer level: Modified independent Equipment used: 1 person hand held assist             General transfer comment: pt had walker in front of her but used it just as prompted  Ambulation/Gait Ambulation/Gait assistance: Min guard Gait Distance (Feet): 450 Feet Assistive device: Rolling walker (2 wheeled) Gait Pattern/deviations: Step-through pattern;Decreased stride length;Narrow base of support Gait velocity: reduced Gait velocity interpretation: <1.31 ft/sec, indicative of household ambulator General Gait Details: pt is stepping with some minor coordination but is not with one leg, RLE is testing weaker than LLE  Stairs            Wheelchair Mobility    Modified Rankin (Stroke Patients Only)       Balance Overall  balance assessment: Needs assistance Sitting-balance support: Feet supported Sitting balance-Leahy Scale: Good     Standing balance support: Bilateral upper extremity supported;During functional activity Standing balance-Leahy Scale: Fair                               Pertinent Vitals/Pain Pain Assessment: Faces Faces Pain Scale: Hurts little more Pain Location: R wrist and L side of face Pain Descriptors / Indicators: Sore Pain Intervention(s): Monitored during session;Repositioned    Home Living Family/patient expects to be discharged to:: Private residence Living Arrangements: Alone Available Help at Discharge: Family;Available 24 hours/day Type of Home: House Home Access: Stairs to enter Entrance Stairs-Rails: Left Entrance Stairs-Number of Steps: 4 Home Layout: One level Home Equipment: Walker - 2 wheels;Cane - single point;Wheelchair - manual Additional Comments: can stay with daughter if needed    Prior Function Level of Independence: Independent         Comments: fully able to care for herself, home and no AD needed     Hand Dominance        Extremity/Trunk Assessment   Upper Extremity Assessment Upper Extremity Assessment: Overall WFL for tasks assessed    Lower Extremity Assessment Lower Extremity Assessment: RLE deficits/detail RLE Deficits / Details: 3+ to 4- R knee and ankle RLE Coordination: decreased gross motor    Cervical / Trunk Assessment Cervical / Trunk Assessment: Kyphotic  Communication   Communication: No difficulties  Cognition Arousal/Alertness: Awake/alert Behavior During Therapy: WFL for tasks assessed/performed Overall Cognitive Status: Within Functional Limits for tasks assessed                                        General Comments General comments (skin integrity, edema, etc.): L eye is red and irritated on sclera from hitting head in shower    Exercises     Assessment/Plan    PT  Assessment Patient needs continued PT services  PT Problem List Decreased strength;Decreased range of motion;Decreased activity tolerance;Decreased balance;Decreased mobility;Decreased coordination;Decreased knowledge of use of DME;Decreased safety awareness;Cardiopulmonary status limiting activity;Pain;Decreased skin integrity       PT Treatment Interventions DME instruction;Gait training;Stair training;Functional mobility training;Therapeutic activities;Therapeutic exercise;Balance training;Neuromuscular re-education;Patient/family education    PT Goals (Current goals can be found in the Care Plan section)  Acute Rehab PT Goals Patient Stated Goal: to walk and get home PT Goal Formulation: With patient Time For Goal Achievement: 07/05/18 Potential to Achieve Goals: Good    Frequency Min 2X/week   Barriers to discharge Decreased caregiver support;Inaccessible home environment home alone with no family    Co-evaluation               AM-PAC PT "6 Clicks" Daily Activity  Outcome Measure Difficulty turning over in bed (including adjusting bedclothes, sheets and blankets)?: A Little Difficulty moving from lying on back to sitting on the side of the bed? : A Little Difficulty sitting down on and standing up from a chair with arms (e.g., wheelchair, bedside commode, etc,.)?: Unable Help needed moving to and from a bed to chair (including a wheelchair)?: A Little Help needed walking in hospital room?: A Little Help needed climbing 3-5 steps with a railing? : A Little 6 Click Score: 16    End of Session Equipment Utilized During Treatment: Gait belt Activity Tolerance: Patient tolerated treatment well;Treatment limited secondary to medical complications (Comment)(limited final leg of walk due to pulse being 113) Patient left: in chair;with call bell/phone within reach Nurse Communication: Mobility status PT Visit Diagnosis: Unsteadiness on feet (R26.81);Muscle weakness  (generalized) (M62.81);History of falling (Z91.81);Other (comment);Pain(R leg weak) Pain - Right/Left: Right(both sides) Pain - part of body: Hand(face)    Time: 5003-7048 PT Time Calculation (min) (ACUTE ONLY): 31 min   Charges:   PT Evaluation $PT Eval Moderate Complexity: 1 Mod PT Treatments $Gait Training: 8-22 mins        Ramond Dial 06/21/2018, 9:55 AM   Mee Hives, PT MS Acute Rehab Dept. Number: [facility] [place]

## 2018-06-21 NOTE — Progress Notes (Addendum)
Inpatient Diabetes Program Recommendations  AACE/ADA: New Consensus Statement on Inpatient Glycemic Control (2015)  Target Ranges:  Prepandial:   less than 140 mg/dL      Peak postprandial:   less than 180 mg/dL (1-2 hours)      Critically ill patients:  140 - 180 mg/dL   Results for LERLENE, TREADWELL (MRN [id number]) as of 06/21/2018 09:02  Ref. Range 06/20/2018 18:01  Glucose Latest Ref Range: 70 - 99 mg/dL 282 (H)   Results for MOLLIE, ROSSANO (MRN [id number]) as of 06/21/2018 09:02  Ref. Range 06/21/2018 04:18  Hemoglobin A1C Latest Ref Range: 4.8 - 5.6 % 10.6 (H)  Average glucose 257 mg/dl    Admit with: RUE Weakness/ UTI/ Facial Laceration  History: CHF, HTN      MD- Do not see that patient has prior History of DM in H&P.  Current A1c of 10.6%.  Is this a new diagnosis of Diabetes for this patient?  If so, please address with patient and we will have nursing team begin basic Diabetes education at bedside.  If this is a new diagnosis, patient will also likely need medications at time of discharge.    Per AACE guidelines, if A1c is >9% and patient is asymptomatic of hyperglycemia symptoms, can try dual oral medication therapy:  Could try the following at time of d/c:  Metformin 500 mg BID + Tradjenta 5 mg daily  Patient also needs to follow up with PCP after discharge as well.  Please also consider placing orders for Novolog Sensitive Correction Scale/ SSI (0-9 units) TID AC + HS    --Will follow patient during hospitalization--  Wyn Quaker RN, MSN, CDE Diabetes Coordinator Inpatient Glycemic Control Team Team Pager: [phone] (8a-5p)

## 2018-06-21 NOTE — Discharge Summary (Addendum)
[facility] NAME: Patricia Lawson    MR#:  [id number]  DATE OF BIRTH:  [birth year]  DATE OF ADMISSION:  06/20/2018 ADMITTING PHYSICIAN: Lance Coon, MD  DATE OF DISCHARGE: 06/21/2018  PRIMARY CARE PHYSICIAN: Lujean Amel D, MD    ADMISSION DIAGNOSIS:  RUE weakness [R29.898] Laceration of left eyebrow, initial encounter [S01.112A] Abrasion of left cornea, initial encounter [S05.02XA] Fall, initial encounter [id number]] Subconjunctival hematoma, left [H11.32]  DISCHARGE DIAGNOSIS:  Right Upper extremity weakness--resolved DM-2 new onset Laceration left eyebrow s/p suturing.  SECONDARY DIAGNOSIS:   Past Medical History:  Diagnosis Date  . CHF (congestive heart failure) ([place]) 2010  . Coronary artery disease   . Hypertension     HOSPITAL COURSE:   Patricia Lawson is a 72 y.o. female history of CHF as well as CAD on aspirin presents the ER for syncopal event occurred while she was in the shower.  Patient states she remembers finishing her shower and started feeling a heaviness in her right upper extremity like her arm was "dumb" and she was unable to grab anything.  1. RUE weakness -was transient and has now resolved.   -suspected TIA -CT head neg -cont asa 81 mg qd -MRI brain -ultrasound carotid bilateral negative for any significant stenosis  2.  UTI (urinary tract infection) -IV antibiotics -no fever, no dysuria, no back pain -UA not too impressive. D/cabxs  3.  Facial laceration -repaired by ED physician, PRN analgesia  4.  HTN (hypertension) -permissive hypertension with blood pressure goal less than 220/120 until MRI can confirm or disprove stroke -bp stable -resume home meds  5.  CAD (coronary artery disease) -continue home meds  6. New onset DM-2 Start po metforim XR 500 mg qd and keep log of sugars at home with PCP to follow.  Will d/c later to home PT --[facility], gets outpatient physical therapy  and will have her resume her routine.  D/w dter in the room  CONSULTS OBTAINED:    DRUG ALLERGIES:  No Known Allergies  DISCHARGE MEDICATIONS:   Allergies as of 06/21/2018   No Known Allergies     Medication List    TAKE these medications   acetaminophen 500 MG tablet Commonly known as:  TYLENOL Take 2 tablets (1,000 mg total) by mouth every 8 (eight) hours. What changed:    when to take this  reasons to take this   aspirin 81 MG EC tablet Take 1 tablet (81 mg total) by mouth daily. Start taking on:  06/22/2018 What changed:    medication strength  how much to take   atorvastatin 80 MG tablet Commonly known as:  LIPITOR Take 80 mg by mouth daily.   irbesartan 75 MG tablet Commonly known as:  AVAPRO Take 75 mg by mouth daily.   metFORMIN 500 MG 24 hr tablet Commonly known as:  GLUCOPHAGE-XR Take 1 tablet (500 mg total) by mouth daily with breakfast.   metoprolol succinate 100 MG 24 hr tablet Commonly known as:  TOPROL-XL Take 100 mg by mouth daily.       If you experience worsening of your admission symptoms, develop shortness of breath, life threatening emergency, suicidal or homicidal thoughts you must seek medical attention immediately by calling 911 or calling your MD immediately  if symptoms less severe.  You Must read complete instructions/literature along with all the possible adverse reactions/side effects for all the Medicines you take and that have been prescribed to you.  Take any new Medicines after you have completely understood and accept all the possible adverse reactions/side effects.   Please note  You were cared for by a hospitalist during your hospital stay. If you have any questions about your discharge medications or the care you received while you were in the hospital after you are discharged, you can call the unit and asked to speak with the hospitalist on call if the hospitalist that took care of you is not available. Once you are  discharged, your primary care physician will handle any further medical issues. Please note that NO REFILLS for any discharge medications will be authorized once you are discharged, as it is imperative that you return to your primary care physician (or establish a relationship with a primary care physician if you do not have one) for your aftercare needs so that they can reassess your need for medications and monitor your lab values. Today   SUBJECTIVE   Feels a lot better. Right upper extremity numbness resolved. No focal weakness. Ambulated with physical therapy well. VITAL SIGNS:  Blood pressure 130/77, pulse 83, temperature 97.7 F (36.5 C), temperature source Oral, resp. rate 18, height 5\' 1"  (1.549 m), weight 60.3 kg, SpO2 93 %.  I/O:    Intake/Output Summary (Last 24 hours) at 06/21/2018 1523 Last data filed at 06/20/2018 2210 Gross per 24 hour  Intake 100 ml  Output -  Net 100 ml    PHYSICAL EXAMINATION:  GENERAL:  72 y.o.-year-old patient lying in the bed with no acute distress.  EYES: Pupils equal, round, reactive to light and accommodation. No scleral icterus. Extraocular muscles intact.  HEENT: Head atraumatic, normocephalic. Oropharynx and nasopharynx clear. Left eyebrow suture+ NECK:  Supple, no jugular venous distention. No thyroid enlargement, no tenderness.  LUNGS: Normal breath sounds bilaterally, no wheezing, rales,rhonchi or crepitation. No use of accessory muscles of respiration.  CARDIOVASCULAR: S1, S2 normal. No murmurs, rubs, or gallops.  ABDOMEN: Soft, non-tender, non-distended. Bowel sounds present. No organomegaly or mass.  EXTREMITIES: No pedal edema, cyanosis, or clubbing.  NEUROLOGIC: Cranial nerves II through XII are intact. Muscle strength 5/5 in all extremities. Sensation intact. Gait not checked.  PSYCHIATRIC: The patient is alert and oriented x 3.  SKIN: No obvious rash, lesion, or ulcer.   DATA REVIEW:   CBC  Recent Labs  Lab 06/20/18 1801   WBC 12.2*  HGB 14.1  HCT 40.2  PLT 213    Chemistries  Recent Labs  Lab 06/20/18 1801  NA 134*  K 4.1  CL 100  CO2 26  GLUCOSE 282*  BUN 12  CREATININE 0.79  CALCIUM 11.2*    Microbiology Results   No results found for this or any previous visit (from the past 240 hour(s)).  RADIOLOGY:  Ct Head Wo Contrast  Result Date: 06/20/2018 CLINICAL DATA:  Fall with laceration left periorbital region. EXAM: CT HEAD WITHOUT CONTRAST CT MAXILLOFACIAL WITHOUT CONTRAST CT CERVICAL SPINE WITHOUT CONTRAST TECHNIQUE: Multidetector CT imaging of the head, cervical spine, and maxillofacial structures were performed using the standard protocol without intravenous contrast. Multiplanar CT image reconstructions of the cervical spine and maxillofacial structures were also generated. COMPARISON:  None. FINDINGS: CT HEAD FINDINGS Brain: Ventricles, cisterns and other CSF spaces are within normal. There is no mass, mass effect, shift of midline structures or acute hemorrhage. No evidence of acute infarction. Subtle chronic ischemic microvascular disease. Vascular: No hyperdense vessel or unexpected calcification. Skull: Normal. Negative for fracture or focal lesion. Other: None. CT  MAXILLOFACIAL FINDINGS Osseous: No fracture or mandibular dislocation. No destructive process. Orbits: Negative. No traumatic or inflammatory finding. Sinuses: Clear. Soft tissues: Minimal left periorbital soft tissue swelling/laceration. CT CERVICAL SPINE FINDINGS Alignment: Normal. Skull base and vertebrae: Mild spondylosis throughout the cervical spine. Vertebral body heights are maintained. Atlantoaxial articulation is normal. There is mild uncovertebral joint spurring and minimal facet arthropathy. There is no acute fracture or subluxation. Right-sided neural foraminal narrowing at the C5-6 level and C6-7 levels. Soft tissues and spinal canal: No prevertebral fluid or swelling. No visible canal hematoma. Disc levels: Mild disc  space narrowing at the C3-4, C5-6 and C6-7 levels. Upper chest: Negative. Other: None. IMPRESSION: No acute brain injury. Minimal chronic ischemic microvascular disease. No acute facial bone fracture. Minimal left periorbital soft tissue swelling/laceration. No acute cervical spine injury. Mild spondylosis of the cervical spine with mild disc disease at the C3-4, C5-6 and C6-7 levels. Right-sided neural foraminal narrowing at the C5-6 and C6-7 levels. Electronically Signed   By: Marin Olp M.D.   On: 06/20/2018 21:01   Ct Cervical Spine Wo Contrast  Result Date: 06/20/2018 CLINICAL DATA:  Fall with laceration left periorbital region. EXAM: CT HEAD WITHOUT CONTRAST CT MAXILLOFACIAL WITHOUT CONTRAST CT CERVICAL SPINE WITHOUT CONTRAST TECHNIQUE: Multidetector CT imaging of the head, cervical spine, and maxillofacial structures were performed using the standard protocol without intravenous contrast. Multiplanar CT image reconstructions of the cervical spine and maxillofacial structures were also generated. COMPARISON:  None. FINDINGS: CT HEAD FINDINGS Brain: Ventricles, cisterns and other CSF spaces are within normal. There is no mass, mass effect, shift of midline structures or acute hemorrhage. No evidence of acute infarction. Subtle chronic ischemic microvascular disease. Vascular: No hyperdense vessel or unexpected calcification. Skull: Normal. Negative for fracture or focal lesion. Other: None. CT MAXILLOFACIAL FINDINGS Osseous: No fracture or mandibular dislocation. No destructive process. Orbits: Negative. No traumatic or inflammatory finding. Sinuses: Clear. Soft tissues: Minimal left periorbital soft tissue swelling/laceration. CT CERVICAL SPINE FINDINGS Alignment: Normal. Skull base and vertebrae: Mild spondylosis throughout the cervical spine. Vertebral body heights are maintained. Atlantoaxial articulation is normal. There is mild uncovertebral joint spurring and minimal facet arthropathy. There is no  acute fracture or subluxation. Right-sided neural foraminal narrowing at the C5-6 level and C6-7 levels. Soft tissues and spinal canal: No prevertebral fluid or swelling. No visible canal hematoma. Disc levels: Mild disc space narrowing at the C3-4, C5-6 and C6-7 levels. Upper chest: Negative. Other: None. IMPRESSION: No acute brain injury. Minimal chronic ischemic microvascular disease. No acute facial bone fracture. Minimal left periorbital soft tissue swelling/laceration. No acute cervical spine injury. Mild spondylosis of the cervical spine with mild disc disease at the C3-4, C5-6 and C6-7 levels. Right-sided neural foraminal narrowing at the C5-6 and C6-7 levels. Electronically Signed   By: Marin Olp M.D.   On: 06/20/2018 21:01   US Carotid Bilateral (at [facility] Only)  Result Date: 06/21/2018 CLINICAL DATA:  Right upper extremity weakness, hypertension, coronary artery disease post stenting, previous tobacco abuse EXAM: BILATERAL CAROTID DUPLEX ULTRASOUND TECHNIQUE: Pearline Cables scale imaging, color Doppler and duplex ultrasound were performed of bilateral carotid and vertebral arteries in the neck. COMPARISON:  None. FINDINGS: Criteria: Quantification of carotid stenosis is based on velocity parameters that correlate the residual internal carotid diameter with NASCET-based stenosis levels, using the diameter of the distal internal carotid lumen as the denominator for stenosis measurement. The following velocity measurements were obtained: RIGHT ICA: 105/15 cm/sec CCA: 96/7 cm/sec SYSTOLIC ICA/CCA RATIO:  1.2 ECA: 153 cm/sec LEFT ICA: 101/14 cm/sec CCA: 44/81 cm/sec SYSTOLIC ICA/CCA RATIO:  1.1 ECA: 255 cm/sec RIGHT CAROTID ARTERY: Partially calcified plaque in the carotid bulb resulting in at least mild stenosis. Some areas of distal acoustic shadowing. Normal waveforms and color Doppler signal. Mildly elevated peak systolic velocities in the proximal external carotid artery, of doubtful clinical  significance. RIGHT VERTEBRAL ARTERY:  Normal flow direction and waveform. LEFT CAROTID ARTERY: Partially calcified plaque at the carotid bifurcation involving the external carotid artery and ICA origins. Normal waveforms and color Doppler signal. LEFT VERTEBRAL ARTERY:  Normal flow direction and waveform. IMPRESSION: 1. Bilateral carotid bifurcation plaque resulting in less than 50% diameter ICA stenosis. 2.  Antegrade bilateral vertebral arterial flow. Electronically Signed   By: Lucrezia Europe M.D.   On: 06/21/2018 14:29   Ct Maxillofacial Wo Contrast  Result Date: 06/20/2018 CLINICAL DATA:  Fall with laceration left periorbital region. EXAM: CT HEAD WITHOUT CONTRAST CT MAXILLOFACIAL WITHOUT CONTRAST CT CERVICAL SPINE WITHOUT CONTRAST TECHNIQUE: Multidetector CT imaging of the head, cervical spine, and maxillofacial structures were performed using the standard protocol without intravenous contrast. Multiplanar CT image reconstructions of the cervical spine and maxillofacial structures were also generated. COMPARISON:  None. FINDINGS: CT HEAD FINDINGS Brain: Ventricles, cisterns and other CSF spaces are within normal. There is no mass, mass effect, shift of midline structures or acute hemorrhage. No evidence of acute infarction. Subtle chronic ischemic microvascular disease. Vascular: No hyperdense vessel or unexpected calcification. Skull: Normal. Negative for fracture or focal lesion. Other: None. CT MAXILLOFACIAL FINDINGS Osseous: No fracture or mandibular dislocation. No destructive process. Orbits: Negative. No traumatic or inflammatory finding. Sinuses: Clear. Soft tissues: Minimal left periorbital soft tissue swelling/laceration. CT CERVICAL SPINE FINDINGS Alignment: Normal. Skull base and vertebrae: Mild spondylosis throughout the cervical spine. Vertebral body heights are maintained. Atlantoaxial articulation is normal. There is mild uncovertebral joint spurring and minimal facet arthropathy. There is no  acute fracture or subluxation. Right-sided neural foraminal narrowing at the C5-6 level and C6-7 levels. Soft tissues and spinal canal: No prevertebral fluid or swelling. No visible canal hematoma. Disc levels: Mild disc space narrowing at the C3-4, C5-6 and C6-7 levels. Upper chest: Negative. Other: None. IMPRESSION: No acute brain injury. Minimal chronic ischemic microvascular disease. No acute facial bone fracture. Minimal left periorbital soft tissue swelling/laceration. No acute cervical spine injury. Mild spondylosis of the cervical spine with mild disc disease at the C3-4, C5-6 and C6-7 levels. Right-sided neural foraminal narrowing at the C5-6 and C6-7 levels. Electronically Signed   By: Marin Olp M.D.   On: 06/20/2018 21:01     Management plans discussed with the patient, family and they are in agreement.  CODE STATUS:     Code Status Orders  (From admission, onward)         Start     Ordered   06/21/18 0015  Full code  Continuous     06/21/18 0015        Code Status History    Date Active Date Inactive Code Status Order ID Comments User Context   04/05/2018 1808 04/07/2018 1738 Full Code [id number]  Leim Fabry, MD Inpatient   03/04/2015 1925 03/10/2015 1714 Full Code [id number]  Molly Maduro, MD Inpatient      TOTAL TIME TAKING CARE OF THIS PATIENT: 40 minutes.    Fritzi Mandes M.D on 06/21/2018 at 3:23 PM  Between 7am to 6pm - Pager - [phone] After 6pm go to [email or web address] - password [email or web address]  [facility]  Office  [phone]  CC: Primary care physician; Yolonda Kida, MD

## 2018-06-21 NOTE — Progress Notes (Signed)
OT Cancellation Note  Patient Details Name: SARI COGAN MRN: [id number] DOB: April 01, 1946   Cancelled Treatment:    Reason Eval/Treat Not Completed: Patient at procedure or test/ unavailable Order received and chart reviewed.  Pt out of room and her daughter indicated  she was at carotid US.  Will attempt again later today.  Chrys Racer, OTR/L ascom [phone] 06/21/18, 2:38 PM

## 2018-06-22 DIAGNOSIS — I639 Cerebral infarction, unspecified: Secondary | ICD-10-CM | POA: Diagnosis not present

## 2018-06-22 DIAGNOSIS — R29898 Other symptoms and signs involving the musculoskeletal system: Secondary | ICD-10-CM | POA: Diagnosis not present

## 2018-06-22 DIAGNOSIS — N390 Urinary tract infection, site not specified: Secondary | ICD-10-CM | POA: Diagnosis not present

## 2018-06-22 DIAGNOSIS — I2510 Atherosclerotic heart disease of native coronary artery without angina pectoris: Secondary | ICD-10-CM | POA: Diagnosis not present

## 2018-06-22 DIAGNOSIS — I10 Essential (primary) hypertension: Secondary | ICD-10-CM | POA: Diagnosis not present

## 2018-06-22 DIAGNOSIS — S0181XA Laceration without foreign body of other part of head, initial encounter: Secondary | ICD-10-CM | POA: Diagnosis not present

## 2018-06-22 LAB — URINE CULTURE: Culture: NO GROWTH

## 2018-06-22 LAB — GLUCOSE, CAPILLARY: Glucose-Capillary: 219 mg/dL — ABNORMAL HIGH (ref 70–99)

## 2018-06-22 NOTE — Care Management Note (Signed)
Case Management Note  Patient Details  Name: Patricia Lawson MRN: [id number] Date of Birth: 1945/12/31  Subjective/Objective:   Patient admitted to [facility] under observation status for RUE weakness. RNCM consulted on patient to provide MOON letter and complete assessment. Patient to be discharged with resumption of Outpatient PT that she has previously started. No RNCM needs.  Ines Bloomer RN BSN RNCM [phone]                   Action/Plan:   Expected Discharge Date:  06/21/18               Expected Discharge Plan:     In-House Referral:     Discharge planning Services     Post Acute Care Choice:    Choice offered to:     DME Arranged:    DME Agency:     HH Arranged:    HH Agency:     Status of Service:     If discussed at [employer], dates discussed:    Additional Comments:  Latanya Maudlin, RN 06/22/2018, 8:48 AM

## 2018-06-22 NOTE — Progress Notes (Signed)
Pt D/C to home with daughter. IV removed intact. Tele removed. VSS. Pt dressed and taken to visitors entrance by volunteer services.

## 2018-06-22 NOTE — Care Management Obs Status (Signed)
[facility] NOTIFICATION   Patient Details  Name: Patricia Lawson MRN: [id number] Date of Birth: 10/15/1946   Medicare Observation Status Notification Given:  Yes    Stefanee Mckell A Sai Moura, RN 06/22/2018, 8:40 AM

## 2018-06-22 NOTE — Discharge Summary (Signed)
Patricia Lawson at [place] NAME: Patricia Lawson    MR#:  [id number]  DATE OF BIRTH:  08/04/1946  DATE OF ADMISSION:  06/20/2018 ADMITTING PHYSICIAN: Lance Coon, MD  DATE OF DISCHARGE: 06/21/2018  PRIMARY CARE PHYSICIAN: Lujean Amel D, MD    ADMISSION DIAGNOSIS:  RUE weakness [R29.898] Laceration of left eyebrow, initial encounter [S01.112A] Abrasion of left cornea, initial encounter [S05.02XA] Fall, initial encounter [id number]] Subconjunctival hematoma, left [H11.32]  DISCHARGE DIAGNOSIS:  *Acute left temporal lacunar infarct with small left posterior parietaloccipital subdural hematoma *DM-2 new onset *Laceration left eyebrow s/p suturing.  SECONDARY DIAGNOSIS:   Past Medical History:  Diagnosis Date  . CHF (congestive heart failure) ([place]) 2010  . Coronary artery disease   . Hypertension     HOSPITAL COURSE:   Patricia Lawson is a 72 y.o. female history of CHF as well as CAD on aspirin presents the ER for syncopal event occurred while she was in the shower.  Patient states she remembers finishing her shower and started feeling a heaviness in her right upper extremity like her arm was "dumb" and she was unable to grab anything.  1.Acute left temporal lacunar infarct with small left posterior parietaloccipital subdural hematoma -came in with RUE weakness -was transient and has now resolved.   -CT head neg -cont asa 81 mg qd (curbside Neurology) -MRI brain as above -ultrasound carotid bilateral negative for any significant stenosis -cont statins  2.  UTI (urinary tract infection) -IV antibiotics -no fever, no dysuria, no back pain -UA not too impressive. D/cabxs  3.  Facial laceration -repaired by ED physician, PRN analgesia  4.  HTN (hypertension) -permissive hypertension with blood pressure goal less than 220/120 until MRI can confirm or disprove stroke -bp stable -resume home meds  5.  CAD (coronary artery  disease) - continue home meds  6. New onset DM-2 Start po metforim XR 500 mg qd and keep log of sugars at home with PCP to follow.  Will d/c later to home PT --[facility], gets outpatient physical therapy and will have her resume her routine.  D/w dter in the room D/c home  CONSULTS OBTAINED:    DRUG ALLERGIES:  No Known Allergies  DISCHARGE MEDICATIONS:   Allergies as of 06/22/2018   No Known Allergies     Medication List    TAKE these medications   acetaminophen 500 MG tablet Commonly known as:  TYLENOL Take 2 tablets (1,000 mg total) by mouth every 8 (eight) hours. What changed:    when to take this  reasons to take this   aspirin 81 MG EC tablet Take 1 tablet (81 mg total) by mouth daily. What changed:    medication strength  how much to take   atorvastatin 80 MG tablet Commonly known as:  LIPITOR Take 80 mg by mouth daily.   irbesartan 75 MG tablet Commonly known as:  AVAPRO Take 75 mg by mouth daily.   metFORMIN 500 MG 24 hr tablet Commonly known as:  GLUCOPHAGE-XR Take 1 tablet (500 mg total) by mouth daily with breakfast.   metoprolol succinate 100 MG 24 hr tablet Commonly known as:  TOPROL-XL Take 100 mg by mouth daily.       If you experience worsening of your admission symptoms, develop shortness of breath, life threatening emergency, suicidal or homicidal thoughts you must seek medical attention immediately by calling 911 or calling your MD immediately  if symptoms less severe.  You Must  read complete instructions/literature along with all the possible adverse reactions/side effects for all the Medicines you take and that have been prescribed to you. Take any new Medicines after you have completely understood and accept all the possible adverse reactions/side effects.   Please note  You were cared for by a hospitalist during your hospital stay. If you have any questions about your discharge medications or the care you received while you were  in the hospital after you are discharged, you can call the unit and asked to speak with the hospitalist on call if the hospitalist that took care of you is not available. Once you are discharged, your primary care physician will handle any further medical issues. Please note that NO REFILLS for any discharge medications will be authorized once you are discharged, as it is imperative that you return to your primary care physician (or establish a relationship with a primary care physician if you do not have one) for your aftercare needs so that they can reassess your need for medications and monitor your lab values. Today   SUBJECTIVE   Feels a lot better. Right upper extremity numbness resolved. No focal weakness. Ambulated with physical therapy well. VITAL SIGNS:  Blood pressure 133/64, pulse 90, temperature 98 F (36.7 C), resp. rate 18, height 5\' 1"  (1.549 m), weight 60.3 kg, SpO2 95 %.  I/O:   No intake or output data in the 24 hours ending 06/22/18 1108  PHYSICAL EXAMINATION:  GENERAL:  72 y.o.-year-old patient lying in the bed with no acute distress.  EYES: Pupils equal, round, reactive to light and accommodation. No scleral icterus. Extraocular muscles intact.  HEENT: Head atraumatic, normocephalic. Oropharynx and nasopharynx clear. Left eyebrow suture+ NECK:  Supple, no jugular venous distention. No thyroid enlargement, no tenderness.  LUNGS: Normal breath sounds bilaterally, no wheezing, rales,rhonchi or crepitation. No use of accessory muscles of respiration.  CARDIOVASCULAR: S1, S2 normal. No murmurs, rubs, or gallops.  ABDOMEN: Soft, non-tender, non-distended. Bowel sounds present. No organomegaly or mass.  EXTREMITIES: No pedal edema, cyanosis, or clubbing.  NEUROLOGIC: Cranial nerves II through XII are intact. Muscle strength 5/5 in all extremities. Sensation intact. Gait not checked.  PSYCHIATRIC: The patient is alert and oriented x 3.  SKIN: No obvious rash, lesion, or ulcer.    DATA REVIEW:   CBC  Recent Labs  Lab 06/20/18 1801  WBC 12.2*  HGB 14.1  HCT 40.2  PLT 213    Chemistries  Recent Labs  Lab 06/20/18 1801  NA 134*  K 4.1  CL 100  CO2 26  GLUCOSE 282*  BUN 12  CREATININE 0.79  CALCIUM 11.2*    Microbiology Results   Recent Results (from the past 240 hour(s))  Urine Culture     Status: None   Collection Time: 06/20/18  8:16 PM  Result Value Ref Range Status   Specimen Description   Final    URINE, CATHETERIZED Performed at [facility], [address], [place], [place] [address]    Special Requests   Final    NONE Performed at [facility], [address] [place], [place] [address]    Culture   Final    NO GROWTH Performed at [facility], [place] [address], [place],  [address]    Report Status 06/22/2018 FINAL  Final    RADIOLOGY:  Ct Head Wo Contrast  Result Date: 06/20/2018 CLINICAL DATA:  Fall with laceration left periorbital region. EXAM: CT HEAD WITHOUT CONTRAST CT  MAXILLOFACIAL WITHOUT CONTRAST CT CERVICAL SPINE WITHOUT CONTRAST TECHNIQUE: Multidetector CT imaging of the head, cervical spine, and maxillofacial structures were performed using the standard protocol without intravenous contrast. Multiplanar CT image reconstructions of the cervical spine and maxillofacial structures were also generated. COMPARISON:  None. FINDINGS: CT HEAD FINDINGS Brain: Ventricles, cisterns and other CSF spaces are within normal. There is no mass, mass effect, shift of midline structures or acute hemorrhage. No evidence of acute infarction. Subtle chronic ischemic microvascular disease. Vascular: No hyperdense vessel or unexpected calcification. Skull: Normal. Negative for fracture or focal lesion. Other: None. CT MAXILLOFACIAL FINDINGS Osseous: No fracture or mandibular dislocation. No destructive process. Orbits: Negative. No traumatic or inflammatory finding. Sinuses: Clear. Soft tissues: Minimal left  periorbital soft tissue swelling/laceration. CT CERVICAL SPINE FINDINGS Alignment: Normal. Skull base and vertebrae: Mild spondylosis throughout the cervical spine. Vertebral body heights are maintained. Atlantoaxial articulation is normal. There is mild uncovertebral joint spurring and minimal facet arthropathy. There is no acute fracture or subluxation. Right-sided neural foraminal narrowing at the C5-6 level and C6-7 levels. Soft tissues and spinal canal: No prevertebral fluid or swelling. No visible canal hematoma. Disc levels: Mild disc space narrowing at the C3-4, C5-6 and C6-7 levels. Upper chest: Negative. Other: None. IMPRESSION: No acute brain injury. Minimal chronic ischemic microvascular disease. No acute facial bone fracture. Minimal left periorbital soft tissue swelling/laceration. No acute cervical spine injury. Mild spondylosis of the cervical spine with mild disc disease at the C3-4, C5-6 and C6-7 levels. Right-sided neural foraminal narrowing at the C5-6 and C6-7 levels. Electronically Signed   By: Marin Olp M.D.   On: 06/20/2018 21:01   Ct Cervical Spine Wo Contrast  Result Date: 06/20/2018 CLINICAL DATA:  Fall with laceration left periorbital region. EXAM: CT HEAD WITHOUT CONTRAST CT MAXILLOFACIAL WITHOUT CONTRAST CT CERVICAL SPINE WITHOUT CONTRAST TECHNIQUE: Multidetector CT imaging of the head, cervical spine, and maxillofacial structures were performed using the standard protocol without intravenous contrast. Multiplanar CT image reconstructions of the cervical spine and maxillofacial structures were also generated. COMPARISON:  None. FINDINGS: CT HEAD FINDINGS Brain: Ventricles, cisterns and other CSF spaces are within normal. There is no mass, mass effect, shift of midline structures or acute hemorrhage. No evidence of acute infarction. Subtle chronic ischemic microvascular disease. Vascular: No hyperdense vessel or unexpected calcification. Skull: Normal. Negative for fracture or  focal lesion. Other: None. CT MAXILLOFACIAL FINDINGS Osseous: No fracture or mandibular dislocation. No destructive process. Orbits: Negative. No traumatic or inflammatory finding. Sinuses: Clear. Soft tissues: Minimal left periorbital soft tissue swelling/laceration. CT CERVICAL SPINE FINDINGS Alignment: Normal. Skull base and vertebrae: Mild spondylosis throughout the cervical spine. Vertebral body heights are maintained. Atlantoaxial articulation is normal. There is mild uncovertebral joint spurring and minimal facet arthropathy. There is no acute fracture or subluxation. Right-sided neural foraminal narrowing at the C5-6 level and C6-7 levels. Soft tissues and spinal canal: No prevertebral fluid or swelling. No visible canal hematoma. Disc levels: Mild disc space narrowing at the C3-4, C5-6 and C6-7 levels. Upper chest: Negative. Other: None. IMPRESSION: No acute brain injury. Minimal chronic ischemic microvascular disease. No acute facial bone fracture. Minimal left periorbital soft tissue swelling/laceration. No acute cervical spine injury. Mild spondylosis of the cervical spine with mild disc disease at the C3-4, C5-6 and C6-7 levels. Right-sided neural foraminal narrowing at the C5-6 and C6-7 levels. Electronically Signed   By: Marin Olp M.D.   On: 06/20/2018 21:01   Mr Brain Wo Contrast  Result Date: 06/21/2018  CLINICAL DATA:  Initial evaluation for acute syncope, right arm numbness. EXAM: MRI HEAD WITHOUT CONTRAST MRA HEAD WITHOUT CONTRAST TECHNIQUE: Multiplanar, multiecho pulse sequences of the brain and surrounding structures were obtained without intravenous contrast. Angiographic images of the head were obtained using MRA technique without contrast. COMPARISON:  Prior CT from 06/20/2018. FINDINGS: MRI HEAD FINDINGS Brain: Generalized age-related cerebral atrophy. Mild patchy T2/FLAIR hyperintensity within the periventricular, deep, and subcortical white matter both cerebral hemispheres,  nonspecific, but overall mild for age. 7 mm focus of restricted diffusion seen involving the cortical gray matter of the left temporal lobe (series 2, image 27), consistent with a small acute ischemic infarct. No associated hemorrhage or mass effect. No other evidence for acute or subacute ischemia. Gray-white matter differentiation otherwise maintained. No other areas of chronic infarction. Trace extra-axial collection overlies the posterior left parieto-occipital convexity, measuring up to 3 mm in maximal thickness without associated mass effect. Finding most likely reflects a small subdural hematoma, age indeterminate, and may be subacute to chronic in nature. No other extra-axial collection. No mass lesion, midline shift or mass effect. No hydrocephalus. Pituitary gland normal. Vascular: Major intracranial vascular flow voids maintained. Skull and upper cervical spine: Craniocervical junction normal. Upper cervical spine normal. Bone marrow signal intensity within normal limits. No scalp soft tissue abnormality. Sinuses/Orbits: Globes and orbital soft tissues within normal limits. Patient status post ocular lens replacement bilaterally. Paranasal sinuses are clear. No mastoid effusion. Inner ear structures normal. Other: None. MRA HEAD FINDINGS ANTERIOR CIRCULATION: Distal cervical segments of the internal carotid arteries are widely patent with antegrade flow. Petrous, cavernous, and supraclinoid segments patent without hemodynamically significant stenosis. ICA termini widely patent bilaterally. A1 segments, anterior communicating artery common anterior cerebral arteries patent bilaterally. M1 segments patent without stenosis. No proximal M2 occlusion. Distal MCA branches well perfused and symmetric. POSTERIOR CIRCULATION: Vertebral arteries widely patent to the vertebrobasilar junction. Left vertebral artery dominant. Posterior inferior cerebral arteries patent proximally. Basilar widely patent to its distal  aspect. Superior cerebral arteries patent bilaterally. Both of the posterior cerebral arteries primarily supplied via the basilar and widely patent to their distal aspects. No intracranial aneurysm. IMPRESSION: MRI HEAD IMPRESSION: 1. 7 mm acute ischemic nonhemorrhagic cortical infarct involving the left temporal lobe. 2. Trace 3 mm subdural collection overlying the left parieto-occipital convexity, most likely a tiny subdural hematoma. Finding is age indeterminate, and may be subacute to chronic in nature. 3. Mild cerebral white matter changes, nonspecific, but most commonly related to chronic small vessel ischemic disease. MRA HEAD IMPRESSION: Normal intracranial MRA. Electronically Signed   By: Jeannine Boga M.D.   On: 06/21/2018 21:14   US Carotid Bilateral (at [facility] Only)  Result Date: 06/21/2018 CLINICAL DATA:  Right upper extremity weakness, hypertension, coronary artery disease post stenting, previous tobacco abuse EXAM: BILATERAL CAROTID DUPLEX ULTRASOUND TECHNIQUE: Pearline Cables scale imaging, color Doppler and duplex ultrasound were performed of bilateral carotid and vertebral arteries in the neck. COMPARISON:  None. FINDINGS: Criteria: Quantification of carotid stenosis is based on velocity parameters that correlate the residual internal carotid diameter with NASCET-based stenosis levels, using the diameter of the distal internal carotid lumen as the denominator for stenosis measurement. The following velocity measurements were obtained: RIGHT ICA: 105/15 cm/sec CCA: 56/4 cm/sec SYSTOLIC ICA/CCA RATIO:  1.2 ECA: 153 cm/sec LEFT ICA: 101/14 cm/sec CCA: 33/29 cm/sec SYSTOLIC ICA/CCA RATIO:  1.1 ECA: 255 cm/sec RIGHT CAROTID ARTERY: Partially calcified plaque in the carotid bulb resulting in at least mild stenosis. Some  areas of distal acoustic shadowing. Normal waveforms and color Doppler signal. Mildly elevated peak systolic velocities in the proximal external carotid artery, of doubtful clinical  significance. RIGHT VERTEBRAL ARTERY:  Normal flow direction and waveform. LEFT CAROTID ARTERY: Partially calcified plaque at the carotid bifurcation involving the external carotid artery and ICA origins. Normal waveforms and color Doppler signal. LEFT VERTEBRAL ARTERY:  Normal flow direction and waveform. IMPRESSION: 1. Bilateral carotid bifurcation plaque resulting in less than 50% diameter ICA stenosis. 2.  Antegrade bilateral vertebral arterial flow. Electronically Signed   By: Lucrezia Europe M.D.   On: 06/21/2018 14:29   Mr Jodene Nam Head/brain SN Cm  Result Date: 06/21/2018 CLINICAL DATA:  Initial evaluation for acute syncope, right arm numbness. EXAM: MRI HEAD WITHOUT CONTRAST MRA HEAD WITHOUT CONTRAST TECHNIQUE: Multiplanar, multiecho pulse sequences of the brain and surrounding structures were obtained without intravenous contrast. Angiographic images of the head were obtained using MRA technique without contrast. COMPARISON:  Prior CT from 06/20/2018. FINDINGS: MRI HEAD FINDINGS Brain: Generalized age-related cerebral atrophy. Mild patchy T2/FLAIR hyperintensity within the periventricular, deep, and subcortical white matter both cerebral hemispheres, nonspecific, but overall mild for age. 7 mm focus of restricted diffusion seen involving the cortical gray matter of the left temporal lobe (series 2, image 27), consistent with a small acute ischemic infarct. No associated hemorrhage or mass effect. No other evidence for acute or subacute ischemia. Gray-white matter differentiation otherwise maintained. No other areas of chronic infarction. Trace extra-axial collection overlies the posterior left parieto-occipital convexity, measuring up to 3 mm in maximal thickness without associated mass effect. Finding most likely reflects a small subdural hematoma, age indeterminate, and may be subacute to chronic in nature. No other extra-axial collection. No mass lesion, midline shift or mass effect. No hydrocephalus.  Pituitary gland normal. Vascular: Major intracranial vascular flow voids maintained. Skull and upper cervical spine: Craniocervical junction normal. Upper cervical spine normal. Bone marrow signal intensity within normal limits. No scalp soft tissue abnormality. Sinuses/Orbits: Globes and orbital soft tissues within normal limits. Patient status post ocular lens replacement bilaterally. Paranasal sinuses are clear. No mastoid effusion. Inner ear structures normal. Other: None. MRA HEAD FINDINGS ANTERIOR CIRCULATION: Distal cervical segments of the internal carotid arteries are widely patent with antegrade flow. Petrous, cavernous, and supraclinoid segments patent without hemodynamically significant stenosis. ICA termini widely patent bilaterally. A1 segments, anterior communicating artery common anterior cerebral arteries patent bilaterally. M1 segments patent without stenosis. No proximal M2 occlusion. Distal MCA branches well perfused and symmetric. POSTERIOR CIRCULATION: Vertebral arteries widely patent to the vertebrobasilar junction. Left vertebral artery dominant. Posterior inferior cerebral arteries patent proximally. Basilar widely patent to its distal aspect. Superior cerebral arteries patent bilaterally. Both of the posterior cerebral arteries primarily supplied via the basilar and widely patent to their distal aspects. No intracranial aneurysm. IMPRESSION: MRI HEAD IMPRESSION: 1. 7 mm acute ischemic nonhemorrhagic cortical infarct involving the left temporal lobe. 2. Trace 3 mm subdural collection overlying the left parieto-occipital convexity, most likely a tiny subdural hematoma. Finding is age indeterminate, and may be subacute to chronic in nature. 3. Mild cerebral white matter changes, nonspecific, but most commonly related to chronic small vessel ischemic disease. MRA HEAD IMPRESSION: Normal intracranial MRA. Electronically Signed   By: Jeannine Boga M.D.   On: 06/21/2018 21:14   Ct  Maxillofacial Wo Contrast  Result Date: 06/20/2018 CLINICAL DATA:  Fall with laceration left periorbital region. EXAM: CT HEAD WITHOUT CONTRAST CT MAXILLOFACIAL WITHOUT CONTRAST CT CERVICAL SPINE WITHOUT CONTRAST  TECHNIQUE: Multidetector CT imaging of the head, cervical spine, and maxillofacial structures were performed using the standard protocol without intravenous contrast. Multiplanar CT image reconstructions of the cervical spine and maxillofacial structures were also generated. COMPARISON:  None. FINDINGS: CT HEAD FINDINGS Brain: Ventricles, cisterns and other CSF spaces are within normal. There is no mass, mass effect, shift of midline structures or acute hemorrhage. No evidence of acute infarction. Subtle chronic ischemic microvascular disease. Vascular: No hyperdense vessel or unexpected calcification. Skull: Normal. Negative for fracture or focal lesion. Other: None. CT MAXILLOFACIAL FINDINGS Osseous: No fracture or mandibular dislocation. No destructive process. Orbits: Negative. No traumatic or inflammatory finding. Sinuses: Clear. Soft tissues: Minimal left periorbital soft tissue swelling/laceration. CT CERVICAL SPINE FINDINGS Alignment: Normal. Skull base and vertebrae: Mild spondylosis throughout the cervical spine. Vertebral body heights are maintained. Atlantoaxial articulation is normal. There is mild uncovertebral joint spurring and minimal facet arthropathy. There is no acute fracture or subluxation. Right-sided neural foraminal narrowing at the C5-6 level and C6-7 levels. Soft tissues and spinal canal: No prevertebral fluid or swelling. No visible canal hematoma. Disc levels: Mild disc space narrowing at the C3-4, C5-6 and C6-7 levels. Upper chest: Negative. Other: None. IMPRESSION: No acute brain injury. Minimal chronic ischemic microvascular disease. No acute facial bone fracture. Minimal left periorbital soft tissue swelling/laceration. No acute cervical spine injury. Mild spondylosis of  the cervical spine with mild disc disease at the C3-4, C5-6 and C6-7 levels. Right-sided neural foraminal narrowing at the C5-6 and C6-7 levels. Electronically Signed   By: Marin Olp M.D.   On: 06/20/2018 21:01     Management plans discussed with the patient, family and they are in agreement.  CODE STATUS:     Code Status Orders  (From admission, onward)         Start     Ordered   06/21/18 0015  Full code  Continuous     06/21/18 0015        Code Status History    Date Active Date Inactive Code Status Order ID Comments User Context   04/05/2018 1808 04/07/2018 1738 Full Code [id number]  Leim Fabry, MD Inpatient   03/04/2015 1925 03/10/2015 1714 Full Code [id number]  Molly Maduro, MD Inpatient      TOTAL TIME TAKING CARE OF THIS PATIENT: 40 minutes.    Fritzi Mandes M.D on 06/22/2018 at 11:08 AM  Between 7am to 6pm - Pager - [phone] After 6pm go to [email or web address] - password [email or web address] [facility]  Office  [phone]  CC: Primary care physician; Yolonda Kida, MD

## 2018-07-02 ENCOUNTER — Ambulatory Visit (INDEPENDENT_AMBULATORY_CARE_PROVIDER_SITE_OTHER): Payer: Medicare Other | Admitting: Family Medicine

## 2018-07-02 ENCOUNTER — Encounter: Payer: Self-pay | Admitting: Family Medicine

## 2018-07-02 VITALS — BP 130/70 | HR 68 | Ht 61.0 in | Wt 129.0 lb

## 2018-07-02 DIAGNOSIS — I679 Cerebrovascular disease, unspecified: Secondary | ICD-10-CM

## 2018-07-02 DIAGNOSIS — R5383 Other fatigue: Secondary | ICD-10-CM | POA: Diagnosis not present

## 2018-07-02 DIAGNOSIS — S0181XS Laceration without foreign body of other part of head, sequela: Secondary | ICD-10-CM

## 2018-07-02 DIAGNOSIS — Z7689 Persons encountering health services in other specified circumstances: Secondary | ICD-10-CM | POA: Diagnosis not present

## 2018-07-02 NOTE — Patient Instructions (Signed)
GUIDELINES FOR  LOW-CHOLESTEROL, LOW-TRIGLYCERIDE DIETS    FOODS TO USE   MEATS, FISH Choose lean meats (chicken, turkey, veal, and non-fatty cuts of beef with excess fat trimmed; one serving = 3 oz of cooked meat). Also, fresh or frozen fish, canned fish packed in water, and shellfish (lobster, crabs, shrimp, and oysters). Limit use to no more than one serving of one of these per week. Shellfish are high in cholesterol but low in saturated fat and should be used sparingly. Meats and fish should be broiled (pan or oven) or baked on a rack.  EGGS Egg substitutes and egg whites (use freely). Egg yolks (limit two per week).  FRUITS Eat three servings of fresh fruit per day (1 serving =  cup). Be sure to have at least one citrus fruit daily. Frozen and canned fruit with no sugar or syrup added may be used.  VEGETABLES Most vegetables are not limited (see next page). One dark-green (string beans, escarole) or one deep yellow (squash) vegetable is recommended daily. Cauliflower, broccoli, and celery, as well as potato skins, are recommended for their fiber content. (Fiber is associated with cholesterol reduction) It is preferable to steam vegetables, but they may be boiled, strained, or braised with polyunsaturated vegetable oil (see below).  BEANS Dried peas or beans (1 serving =  cup) may be used as a bread substitute.  NUTS Almonds, walnuts, and peanuts may be used sparingly  (1 serving = 1 Tablespoonful). Use pumpkin, sesame, or sunflower seeds.  BREADS, GRAINS One roll or one slice of whole grain or enriched bread may be used, or three soda crackers or four pieces of melba toast as a substitute. Spaghetti, rice or noodles ( cup) or  large ear of corn may be used as a bread substitute. In preparing these foods do not use butter or shortening, use soft margarine. Also use egg and sugar substitutes.  Choose high fiber grains, such as oats and whole wheat.  CEREALS Use  cup of hot cereal or  cup of  cold cereal per day. Add a sugar substitute if desired, with 99% fat free or skim milk.  MILK PRODUCTS Always use 99% fat free or skim milk, dairy products such as low fat cheeses (farmer's uncreamed diet cottage), low-fat yogurt, and powdered skim milk.  FATS, OILS Use soft (not stick) margarine; vegetable oils that are high in polyunsaturated fats (such as safflower, sunflower, soybean, corn, and cottonseed). Always refrigerate meat drippings to harden the fat and remove it before preparing gravies  DESSERTS, SNACKS Limit to two servings per day; substitute each serving for a bread/cereal serving: ice milk, water sherbet (1/4 cup); unflavored gelatin or gelatin flavored with sugar substitute (1/3 cup); pudding prepared with skim milk (1/2 cup); egg white souffls; unbuttered popcorn (1  cups). Substitute carob for chocolate.  BEVERAGES Fresh fruit juices (limit 4 oz per day); black coffee, plain or herbal teas; soft drinks with sugar substitutes; club soda, preferably salt-free; cocoa made with skim milk or nonfat dried milk and water (sugar substitute added if desired); clear broth. Alcohol: limit two servings per day (see second page).  MISCELLANEOUS  You may use the following freely: vinegar, spices, herbs, nonfat bouillon, mustard, Worcestershire sauce, soy sauce, flavoring essence.                  GUIDELINES FOR  LOW-CHOLESTEROL, LOW TRIGLYCERIDE DIETS    FOODS TO AVOID   MEATS, FISH Marbled beef, pork, bacon, sausage, and other pork products; fatty   fowl (duck, goose); skin and fat of [place] and chicken; processed meats; luncheon meats (salami, bologna); frankfurters and fast-food hamburgers (theyre loaded with fat); organ meats (kidneys, liver); canned fish packed in oil.  EGGS Limit egg yolks to two per week.   FRUITS Coconuts (rich i Mediterranean Diet A Mediterranean diet refers to food and lifestyle choices that are based on the traditions of countries located on the  [employer]. This way of eating has been shown to help prevent certain conditions and improve outcomes for people who have chronic diseases, like kidney disease and heart disease. What are tips for following this plan? Lifestyle  Cook and eat meals together with your family, when possible.  Drink enough fluid to keep your urine clear or pale yellow.  Be physically active every day. This includes: ? Aerobic exercise like running or swimming. ? Leisure activities like gardening, walking, or housework.  Get 7-8 hours of sleep each night.  If recommended by your health care provider, drink red wine in moderation. This means 1 glass a day for nonpregnant women and 2 glasses a day for men. A glass of wine equals 5 oz (150 mL). Reading food labels  Check the serving size of packaged foods. For foods such as rice and pasta, the serving size refers to the amount of cooked product, not dry.  Check the total fat in packaged foods. Avoid foods that have saturated fat or trans fats.  Check the ingredients list for added sugars, such as corn syrup. Shopping  At the grocery store, buy most of your food from the areas near the walls of the store. This includes: ? Fresh fruits and vegetables (produce). ? Grains, beans, nuts, and seeds. Some of these may be available in unpackaged forms or large amounts (in bulk). ? Fresh seafood. ? Poultry and eggs. ? Low-fat dairy products.  Buy whole ingredients instead of prepackaged foods.  Buy fresh fruits and vegetables in-season from local farmers markets.  Buy frozen fruits and vegetables in resealable bags.  If you do not have access to quality fresh seafood, buy precooked frozen shrimp or canned fish, such as tuna, salmon, or sardines.  Buy small amounts of raw or cooked vegetables, salads, or olives from the deli or salad bar at your store.  Stock your pantry so you always have certain foods on hand, such as olive oil, canned tuna, canned  tomatoes, rice, pasta, and beans. Cooking  Cook foods with extra-virgin olive oil instead of using butter or other vegetable oils.  Have meat as a side dish, and have vegetables or grains as your main dish. This means having meat in small portions or adding small amounts of meat to foods like pasta or stew.  Use beans or vegetables instead of meat in common dishes like chili or lasagna.  Experiment with different cooking methods. Try roasting or broiling vegetables instead of steaming or sauteing them.  Add frozen vegetables to soups, stews, pasta, or rice.  Add nuts or seeds for added healthy fat at each meal. You can add these to yogurt, salads, or vegetable dishes.  Marinate fish or vegetables using olive oil, lemon juice, garlic, and fresh herbs. Meal planning  Plan to eat 1 vegetarian meal one day each week. Try to work up to 2 vegetarian meals, if possible.  Eat seafood 2 or more times a week.  Have healthy snacks readily available, such as: ? Vegetable sticks with hummus. ? [place] yogurt. ? Fruit and nut trail mix.  Eat balanced meals throughout the week. This includes: ? Fruit: 2-3 servings a day ? Vegetables: 4-5 servings a day ? Low-fat dairy: 2 servings a day ? Fish, poultry, or lean meat: 1 serving a day ? Beans and legumes: 2 or more servings a week ? Nuts and seeds: 1-2 servings a day ? Whole grains: 6-8 servings a day ? Extra-virgin olive oil: 3-4 servings a day  Limit red meat and sweets to only a few servings a month What are my food choices?  Mediterranean diet ? Recommended ? Grains: Whole-grain pasta. Brown rice. Bulgar wheat. Polenta. Couscous. Whole-wheat bread. [place]. ? Vegetables: Artichokes. Beets. Broccoli. Cabbage. Carrots. Eggplant. Green beans. Chard. Kale. Spinach. Onions. Leeks. Peas. Squash. Tomatoes. Peppers. Radishes. ? Fruits: Apples. Apricots. Avocado. Berries. Bananas. Cherries. Dates. Figs. Grapes. Lemons. Melon. Oranges.  Peaches. Plums. Pomegranate. ? Meats and other protein foods: Beans. Almonds. Sunflower seeds. Pine nuts. Peanuts. [place]. Salmon. Scallops. Shrimp. [place]. Tilapia. Clams. Oysters. Eggs. ? Dairy: Low-fat milk. Cheese. Greek yogurt. ? Beverages: Water. Red wine. Herbal tea. ? Fats and oils: Extra virgin olive oil. Avocado oil. Grape seed oil. ? Sweets and desserts: [place] yogurt with honey. Baked apples. Poached pears. Trail mix. ? Seasoning and other foods: Basil. Cilantro. Coriander. Cumin. Mint. Parsley. Sage. Rosemary. Tarragon. Garlic. Oregano. Thyme. Pepper. Balsalmic vinegar. Tahini. Hummus. Tomato sauce. Olives. Mushrooms. ? Limit these ? Grains: Prepackaged pasta or rice dishes. Prepackaged cereal with added sugar. ? Vegetables: Deep fried potatoes (french fries). ? Fruits: Fruit canned in syrup. ? Meats and other protein foods: Beef. Pork. Lamb. Poultry with skin. Hot dogs. Berniece Salines. ? Dairy: Ice cream. Sour cream. Whole milk. ? Beverages: Juice. Sugar-sweetened soft drinks. Beer. Liquor and spirits. ? Fats and oils: Butter. Canola oil. Vegetable oil. Beef fat (tallow). Lard. ? Sweets and desserts: Cookies. Cakes. Pies. Candy. ? Seasoning and other foods: Mayonnaise. Premade sauces and marinades. ? The items listed may not be a complete list. Talk with your dietitian about what dietary choices are right for you. Summary  The Mediterranean diet includes both food and lifestyle choices.  Eat a variety of fresh fruits and vegetables, beans, nuts, seeds, and whole grains.  Limit the amount of red meat and sweets that you eat.  Talk with your health care provider about whether it is safe for you to drink red wine in moderation. This means 1 glass a day for nonpregnant women and 2 glasses a day for men. A glass of wine equals 5 oz (150 mL). This information is not intended to replace advice given to you by your health care provider. Make sure you discuss any questions you have with your health  care provider. Document Released: 06/08/2016 Document Revised: 07/11/2016 Document Reviewed: 06/08/2016 Elsevier Interactive Patient Education  2018 [employer]. n saturated fats).  VEGETABLES Avoid avocados. Starchy vegetables (potatoes, corn, lima beans, dried peas, beans) may be used only if substitutes for a serving of bread or cereal. (Baked potato skin, however, is desirable for its fiber content.  BEANS Commercial baked beans with sugar and/or pork added.  NUTS Avoid nuts.  Limit peanuts and walnuts to one tablespoonful per day.  BREADS, GRAINS Any baked goods with shortening and/or sugar. Commercial mixes with dried eggs and whole milk. Avoid sweet rolls, doughnuts, breakfast pastries (Danish), and sweetened packaged cereals (the added sugar converts readily to triglycerides).  MILK PRODUCTS Whole milk and whole-milk packaged goods; cream; ice cream; whole-milk puddings, yogurt, or cheeses; nondairy cream substitutes.  FATS, OILS  Butter, lard, animal fats, bacon drippings, gravies, cream sauces as well as palm and coconut oils. All these are high in saturated fats. Examine labels on cholesterol free products for hydrogenated fats. (These are oils that have been hardened into solids and in the process have become saturated.)  DESSERTS, SNACKS Fried snack foods like potato chips; chocolate; candies in general; jams, jellies, syrups; whole- milk puddings; ice cream and milk sherbets; hydrogenated peanut butter.  BEVERAGES Sugared fruit juices and soft drinks; cocoa made with whole milk and/or sugar. When using alcohol (1 oz liquor, 5 oz beer, or 2  oz dry table wine per serving), one serving must be substituted for one bread or cereal serving (limit, two servings of alcohol per day).   SPECIAL NOTES    1. Remember that even non-limited foods should be used in moderation. 2. While on a cholesterol-lowering diet, be sure to avoid animal fats and marbled meats. 3. 3. While on a  triglyceride-lowering diet, be sure to avoid sweets and to control the amount of carbohydrates you eat (starchy foods such as flour, bread, potatoes).While on a tri-glyceride-lowering diet, be sure to avoid sweets 4. Buy a good low-fat cookbook, such as the one published by the American Heart Association. 5. Consult your physician if you have any questions.               [facility] Low Glycemic Diet Plan   Low Glycemic Foods (20-49) Moderate Glycemic Foods (50-69) High Glycemic Foods (70-100)      Breakfast Creals Breakfast Cereals Breakfast Cereals  All Bran All-Bran Fruit'n Oats   Bran Buds Bran Chex   Cheerios Corn chex    Fiber One Oatmeal (not instant)   Just Right Mini-Wheats   Corn Flakes Cream of Wheat    Oat Bran Special K Swiss Muesli   Grape Nuts Grape Nut Flakes      Grits Nutri-Grain    Fruits and fruit juice: Fruits Puffed Rice Puffed Wheat    (Limit to 1-2 Servings per day) Banana (under-ride) Dates   Rice Chex Rice Krispies    Apples Apricots (fresh/dried)   Figs Grapes   Shredded Wheat Team    Blackberries Blueberries   Kiwi Mango   Total     Cherries Cranberries   Oranges Raisins     Peaches Pears    Fruits  Plums Prunes   Fruit Juices Pineapple Watermelon    Grapefruit Raspberries   Cranberry Juice Orange Juice   Banana (over-ripe)     Strawberries Tangerines      Apple Juice Grapefruit Juice   Beans and Legumes Beverages  Tomato Juice    [place]-type baked beans Sodas, sweet tea, pineapple juice   Canned pinto, kidney, or navy beans   Beans and Legumes (fresh-cooked) Green peas Vegetables  Black-eyed peas Butter Beans    Potato, baked, boiled, fried, mashed  Chick peas Lentils   Vegetables [place] fries  Green beans Lima beans   Beets Carrots   Canned or frozen corn  Kidney beans Navy beans   Sweet potato Yam   Parsnips  Pinto beans Snow peas   Corn on the cob Winter squash      Non-starchy vegetables Grains  Breads  Asparagus, avocado, broccoli, cabbage Cornmeal Rice, brown   Most breads (white and whole grain)  cauliflower, celery, cucumber, greens Rice, white Couscous   Bagels Bread sticks    lettuce, mushrooms, peppers, tomatoes  Bread stuffing Kaiser roll    okra, onions,  spinach, summer squash Pasta Dinner rolls   [employer], cheese     Grains Ravioli, meat filled Spaghetti, white   Grains  Barley Bulgur    Rice, instant Tapioca, with milk    Rye Wild rice   Nuts    Cashews Macadamia   Candy and most cookies  Nuts and oils    Almonds, peanuts, sunflower seeds Snacks Snacks  hazelnuts, pecans, walnuts Chocolate Ice cream, lowfat   Donuts Corn chips    Oils that are liquid at room temperature Muffin Popcorn   Jelly beans Pretzels      Pastries  Dairy, fish, meat, soy, and eggs    Milk, skim Lowfat cheese    Restaurant and ethnic foods  Yogurt, lowfat, fruit sugar sweetened  Most [place] food (sugar in stir fry    or wok sauce)  Lean red meat Fish    Teriyaki-style meats and vegetables  Skinless chicken and [place], shellfish        Egg whites (up to 3 daily), Soy Products    Egg yolks (up to 7 or _____ per week)

## 2018-07-02 NOTE — Addendum Note (Signed)
Addended by: Juline Patch on: 07/02/2018 05:06 PM   Modules accepted: Level of Service

## 2018-07-02 NOTE — Progress Notes (Signed)
Name: Patricia Lawson   MRN: [id number]    DOB: 1946/04/14   Date:07/02/2018       Progress Note  Subjective  Chief Complaint  Chief Complaint  Patient presents with  . Establish Care    had a stroke and was started on metformin for diab. Has cardiologist- Dr. Clayborn Bigness sees on the 11th of this month.    HPI  No problem-specific Assessment & Plan notes found for this encounter.   Past Medical History:  Diagnosis Date  . CHF (congestive heart failure) ([place]) 2010  . Coronary artery disease   . Diabetes mellitus without complication ([place])   . Hyperlipidemia   . Hypertension   . Stroke [facility])     Past Surgical History:  Procedure Laterality Date  . LAPAROSCOPIC APPENDECTOMY N/A 03/04/2015   Procedure: APPENDECTOMY LAPAROSCOPIC;  Surgeon: Sherri Rad, MD;  Location: [facility] ORS;  Service: General;  Laterality: N/A;  . ORIF PATELLA Right 04/06/2018   Procedure: OPEN REDUCTION INTERNAL (ORIF) FIXATION PATELLA;  Surgeon: Leim Fabry, MD;  Location: [facility] ORS;  Service: Orthopedics;  Laterality: Right;  . STENT PLACE LEFT URETER ([place] HX)     cardiac  . THYROIDECTOMY Bilateral     Family History  Problem Relation Age of Onset  . Heart block Mother   . Heart disease Mother   . Hypertension Mother   . Heart block Father   . Heart disease Father   . Hypertension Father     Social History   Socioeconomic History  . Marital status: Divorced    Spouse name: Not on file  . Number of children: Not on file  . Years of education: Not on file  . Highest education level: Not on file  Occupational History  . Not on file  Social Needs  . Financial resource strain: Not on file  . Food insecurity:    Worry: Not on file    Inability: Not on file  . Transportation needs:    Medical: Not on file    Non-medical: Not on file  Tobacco Use  . Smoking status: Former Smoker    Types: Cigarettes    Last attempt to quit: 03/03/2009    Years since quitting: 9.3  . Smokeless tobacco: Never Used   Substance and Sexual Activity  . Alcohol use: No  . Drug use: Never  . Sexual activity: Not Currently  Lifestyle  . Physical activity:    Days per week: Not on file    Minutes per session: Not on file  . Stress: Not on file  Relationships  . Social connections:    Talks on phone: Not on file    Gets together: Not on file    Attends religious service: Not on file    Active member of club or organization: Not on file    Attends meetings of clubs or organizations: Not on file    Relationship status: Not on file  . Intimate partner violence:    Fear of current or ex partner: Not on file    Emotionally abused: Not on file    Physically abused: Not on file    Forced sexual activity: Not on file  Other Topics Concern  . Not on file  Social History Narrative  . Not on file    No Known Allergies  Outpatient Medications Prior to Visit  Medication Sig Dispense Refill  . aspirin EC 81 MG EC tablet Take 1 tablet (81 mg total) by mouth daily. 30 tablet 0  .  atorvastatin (LIPITOR) 80 MG tablet Take 80 mg by mouth daily. Dr Clayborn Bigness    . irbesartan (AVAPRO) 75 MG tablet Take 75 mg by mouth daily. Dr Clayborn Bigness    . metFORMIN (GLUCOPHAGE-XR) 500 MG 24 hr tablet Take 1 tablet (500 mg total) by mouth daily with breakfast. (Patient taking differently: Take 500 mg by mouth daily with breakfast. Dr Posey Pronto) 60 tablet 0  . metoprolol succinate (TOPROL-XL) 100 MG 24 hr tablet Take 100 mg by mouth daily. Dr Clayborn Bigness    . Multiple Vitamin (MULTIVITAMIN) tablet Take 1 tablet by mouth daily.    Marland Kitchen trimethoprim-polymyxin b (POLYTRIM) ophthalmic solution Place 1 drop into the left eye every 4 (four) hours.    Marland Kitchen acetaminophen (TYLENOL) 500 MG tablet Take 2 tablets (1,000 mg total) by mouth every 8 (eight) hours. (Patient not taking: Reported on 07/02/2018) 30 tablet 0   No facility-administered medications prior to visit.     Review of Systems  Constitutional: Negative for chills, fever, malaise/fatigue and  weight loss.  HENT: Negative for ear discharge, ear pain and sore throat.   Eyes: Negative for blurred vision.  Respiratory: Negative for cough, sputum production, shortness of breath and wheezing.   Cardiovascular: Negative for chest pain, palpitations and leg swelling.  Gastrointestinal: Negative for abdominal pain, blood in stool, constipation, diarrhea, heartburn, melena and nausea.  Genitourinary: Negative for dysuria, frequency, hematuria and urgency.  Musculoskeletal: Negative for back pain, joint pain, myalgias and neck pain.  Skin: Negative for rash.  Neurological: Negative for dizziness, tingling, sensory change, focal weakness and headaches.  Endo/Heme/Allergies: Negative for environmental allergies and polydipsia. Does not bruise/bleed easily.  Psychiatric/Behavioral: Negative for depression and suicidal ideas. The patient is not nervous/anxious and does not have insomnia.      Objective  Vitals:   07/02/18 1528  BP: 130/70  Pulse: 68  Weight: 129 lb (58.5 kg)  Height: 5\' 1"  (1.549 m)    Physical Exam  Constitutional: She appears well-developed and well-nourished.  HENT:  Head: Normocephalic.  Right Ear: External ear normal.  Left Ear: External ear normal.  Mouth/Throat: Oropharynx is clear and moist.  Eyes: Pupils are equal, round, and reactive to light. Conjunctivae and EOM are normal. Lids are everted and swept, no foreign bodies found. Left eye exhibits no hordeolum. No foreign body present in the left eye. Right conjunctiva is not injected. Left conjunctiva is not injected. No scleral icterus.  Neck: Normal range of motion. Neck supple. No JVD present. No tracheal deviation present. No thyromegaly present.  Cardiovascular: Normal rate, regular rhythm, normal heart sounds and intact distal pulses. Exam reveals no gallop and no friction rub.  No murmur heard. Pulmonary/Chest: Effort normal and breath sounds normal. No respiratory distress. She has no wheezes. She  has no rales.  Abdominal: Soft. Bowel sounds are normal. She exhibits no mass. There is no hepatosplenomegaly. There is no tenderness. There is no rebound and no guarding.  Musculoskeletal: Normal range of motion. She exhibits no edema or tenderness.  Lymphadenopathy:    She has no cervical adenopathy.  Neurological: She is alert. She has normal strength. She displays normal reflexes. No cranial nerve deficit.  Skin: Skin is warm. No rash noted.  Psychiatric: She has a normal mood and affect. Her mood appears not anxious. She does not exhibit a depressed mood.  Nursing note and vitals reviewed.     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 Check tsh.   Relevant Orders   TSH   Cerebral vascular disease       Follow up lacunar infarct. stable reemphasized lipid management, hypertension control, and glucose management.       No orders of the defined types were placed in this encounter.     Dr. [facility]  07/02/18

## 2018-07-03 ENCOUNTER — Other Ambulatory Visit: Payer: Self-pay

## 2018-07-03 DIAGNOSIS — R7989 Other specified abnormal findings of blood chemistry: Secondary | ICD-10-CM

## 2018-07-03 LAB — TSH: TSH: 8.28 u[IU]/mL — AB (ref 0.450–4.500)

## 2018-07-03 MED ORDER — LEVOTHYROXINE SODIUM 25 MCG PO TABS: 25.0000 ug | ORAL_TABLET | Freq: Every day | ORAL | 0 refills | Status: DC

## 2018-07-10 DIAGNOSIS — R011 Cardiac murmur, unspecified: Secondary | ICD-10-CM | POA: Diagnosis not present

## 2018-07-10 DIAGNOSIS — I10 Essential (primary) hypertension: Secondary | ICD-10-CM | POA: Diagnosis not present

## 2018-07-10 DIAGNOSIS — I2510 Atherosclerotic heart disease of native coronary artery without angina pectoris: Secondary | ICD-10-CM | POA: Diagnosis not present

## 2018-07-10 DIAGNOSIS — M1710 Unilateral primary osteoarthritis, unspecified knee: Secondary | ICD-10-CM | POA: Diagnosis not present

## 2018-07-10 DIAGNOSIS — E782 Mixed hyperlipidemia: Secondary | ICD-10-CM | POA: Diagnosis not present

## 2018-07-10 DIAGNOSIS — S065X9A Traumatic subdural hemorrhage with loss of consciousness of unspecified duration, initial encounter: Secondary | ICD-10-CM | POA: Diagnosis not present

## 2018-07-10 DIAGNOSIS — Z87891 Personal history of nicotine dependence: Secondary | ICD-10-CM | POA: Diagnosis not present

## 2018-07-10 DIAGNOSIS — E079 Disorder of thyroid, unspecified: Secondary | ICD-10-CM | POA: Diagnosis not present

## 2018-07-10 DIAGNOSIS — I639 Cerebral infarction, unspecified: Secondary | ICD-10-CM | POA: Diagnosis not present

## 2018-07-10 DIAGNOSIS — I252 Old myocardial infarction: Secondary | ICD-10-CM | POA: Diagnosis not present

## 2018-07-10 DIAGNOSIS — I208 Other forms of angina pectoris: Secondary | ICD-10-CM | POA: Diagnosis not present

## 2018-07-18 ENCOUNTER — Other Ambulatory Visit: Payer: Medicare Other

## 2018-07-25 ENCOUNTER — Other Ambulatory Visit: Payer: Self-pay | Admitting: Family Medicine

## 2018-07-25 DIAGNOSIS — R7989 Other specified abnormal findings of blood chemistry: Secondary | ICD-10-CM

## 2018-08-06 DIAGNOSIS — S82041D Displaced comminuted fracture of right patella, subsequent encounter for closed fracture with routine healing: Secondary | ICD-10-CM | POA: Diagnosis not present

## 2018-08-15 ENCOUNTER — Ambulatory Visit (INDEPENDENT_AMBULATORY_CARE_PROVIDER_SITE_OTHER): Payer: Medicare Other | Admitting: Family Medicine

## 2018-08-15 ENCOUNTER — Encounter: Payer: Self-pay | Admitting: Family Medicine

## 2018-08-15 VITALS — BP 142/80 | HR 80 | Ht 61.0 in | Wt 123.0 lb

## 2018-08-15 DIAGNOSIS — Z23 Encounter for immunization: Secondary | ICD-10-CM

## 2018-08-15 DIAGNOSIS — R7989 Other specified abnormal findings of blood chemistry: Secondary | ICD-10-CM

## 2018-08-15 DIAGNOSIS — E039 Hypothyroidism, unspecified: Secondary | ICD-10-CM | POA: Diagnosis not present

## 2018-08-15 DIAGNOSIS — E119 Type 2 diabetes mellitus without complications: Secondary | ICD-10-CM

## 2018-08-15 MED ORDER — METFORMIN HCL ER 500 MG PO TB24: 500.0000 mg | ORAL_TABLET | Freq: Every day | ORAL | 0 refills | Status: DC

## 2018-08-15 NOTE — Patient Instructions (Signed)

## 2018-08-15 NOTE — Progress Notes (Signed)
Date:  08/15/2018   Name:  Patricia Lawson   DOB:  [birth year]   MRN:  [id number]   Chief Complaint: Diabetes and Hypothyroidism Diabetes  She presents for her follow-up diabetic visit. Her disease course has been stable. There are no hypoglycemic associated symptoms. Pertinent negatives for hypoglycemia include no confusion, dizziness, headaches, hunger, mood changes, nervousness/anxiousness, pallor, seizures, sleepiness, speech difficulty, sweats or tremors. Pertinent negatives for diabetes include no blurred vision, no chest pain, no fatigue, no foot paresthesias, no foot ulcerations, no polydipsia, no polyphagia, no polyuria, no visual change, no weakness and no weight loss. There are no hypoglycemic complications. Symptoms are stable. Risk factors for coronary artery disease include diabetes mellitus, dyslipidemia, female sex and hypertension. Current diabetic treatment includes oral agent (monotherapy). She is compliant with treatment some of the time. She participates in exercise every other day. Her breakfast blood glucose is taken between 7-8 am. Her breakfast blood glucose range is generally 130-140 mg/dl. An ACE inhibitor/angiotensin II receptor blocker is being taken. Eye exam is current.     Review of Systems  Constitutional: Negative.  Negative for chills, fatigue, fever, unexpected weight change and weight loss.  HENT: Negative for congestion, ear discharge, ear pain, rhinorrhea, sinus pressure, sneezing and sore throat.   Eyes: Negative for blurred vision, photophobia, pain, discharge, redness and itching.  Respiratory: Negative for cough, shortness of breath, wheezing and stridor.   Cardiovascular: Negative for chest pain.  Gastrointestinal: Negative for abdominal pain, blood in stool, constipation, diarrhea, nausea and vomiting.  Endocrine: Negative for cold intolerance, heat intolerance, polydipsia, polyphagia and polyuria.  Genitourinary: Negative for dysuria, flank pain,  frequency, hematuria, menstrual problem, pelvic pain, urgency, vaginal bleeding and vaginal discharge.  Musculoskeletal: Negative for arthralgias, back pain and myalgias.  Skin: Negative for pallor and rash.  Allergic/Immunologic: Negative for environmental allergies and food allergies.  Neurological: Negative for dizziness, tremors, seizures, speech difficulty, weakness, light-headedness, numbness and headaches.  Hematological: Negative for adenopathy. Does not bruise/bleed easily.  Psychiatric/Behavioral: Negative for confusion and dysphoric mood. The patient is not nervous/anxious.     Patient Active Problem List   Diagnosis Date Noted  . Fall 06/21/2018  . RUE weakness 06/20/2018  . Facial laceration 06/20/2018  . HTN (hypertension) 06/20/2018  . CAD (coronary artery disease) 06/20/2018  . UTI (urinary tract infection) 06/20/2018  . Patella fracture 04/05/2018  . Appendicitis, acute 03/04/2015    No Known Allergies  Past Surgical History:  Procedure Laterality Date  . LAPAROSCOPIC APPENDECTOMY N/A 03/04/2015   Procedure: APPENDECTOMY LAPAROSCOPIC;  Surgeon: Sherri Rad, MD;  Location: [facility] ORS;  Service: General;  Laterality: N/A;  . ORIF PATELLA Right 04/06/2018   Procedure: OPEN REDUCTION INTERNAL (ORIF) FIXATION PATELLA;  Surgeon: Leim Fabry, MD;  Location: [facility] ORS;  Service: Orthopedics;  Laterality: Right;  . STENT PLACE LEFT URETER ([place] HX)     cardiac  . THYROIDECTOMY Bilateral     Social History   Tobacco Use  . Smoking status: Former Smoker    Types: Cigarettes    Last attempt to quit: 03/03/2009    Years since quitting: 9.4  . Smokeless tobacco: Never Used  Substance Use Topics  . Alcohol use: No  . Drug use: Never     Medication list has been reviewed and updated.  Current Meds  Medication Sig  . aspirin EC 81 MG EC tablet Take 1 tablet (81 mg total) by mouth daily.  Marland Kitchen atorvastatin (LIPITOR) 80 MG tablet Take 80 mg  by mouth daily. Dr Clayborn Bigness  .  irbesartan (AVAPRO) 75 MG tablet Take 75 mg by mouth daily. Dr Clayborn Bigness  . levothyroxine (SYNTHROID, LEVOTHROID) 25 MCG tablet TAKE 1 TABLET BY MOUTH ONCE DAILY BEFORE BREAKFAST  . metFORMIN (GLUCOPHAGE-XR) 500 MG 24 hr tablet Take 1 tablet (500 mg total) by mouth daily with breakfast. (Patient taking differently: Take 500 mg by mouth daily with breakfast. Dr Posey Pronto)  . metoprolol succinate (TOPROL-XL) 100 MG 24 hr tablet Take 100 mg by mouth daily. Dr Clayborn Bigness  . Multiple Vitamin (MULTIVITAMIN) tablet Take 1 tablet by mouth daily.    PHQ 2/9 Scores 08/15/2018 07/02/2018  PHQ - 2 Score 0 4  PHQ- 9 Score 0 13    Physical Exam  Constitutional: She is oriented to person, place, and time. She appears well-developed and well-nourished.  HENT:  Head: Normocephalic.  Right Ear: External ear normal.  Left Ear: External ear normal.  Mouth/Throat: Oropharynx is clear and moist.  Eyes: Pupils are equal, round, and reactive to light. Conjunctivae and EOM are normal. Lids are everted and swept, no foreign bodies found. Left eye exhibits no hordeolum. No foreign body present in the left eye. Right conjunctiva is not injected. Left conjunctiva is not injected. No scleral icterus.  Neck: Normal range of motion. Neck supple. No JVD present. No tracheal deviation present. No thyromegaly present.  Cardiovascular: Normal rate, regular rhythm, normal heart sounds and intact distal pulses. Exam reveals no gallop and no friction rub.  No murmur heard. Pulmonary/Chest: Effort normal and breath sounds normal. No respiratory distress. She has no wheezes. She has no rales.  Abdominal: Soft. Bowel sounds are normal. She exhibits no mass. There is no hepatosplenomegaly. There is no tenderness. There is no rebound and no guarding.  Musculoskeletal: Normal range of motion. She exhibits no edema or tenderness.  Lymphadenopathy:    She has no cervical adenopathy.  Neurological: She is alert and oriented to person, place,  and time. She has normal strength. She displays normal reflexes. No cranial nerve deficit.  Skin: Skin is warm. No rash noted.  Psychiatric: She has a normal mood and affect. Her mood appears not anxious. She does not exhibit a depressed mood.  Nursing note and vitals reviewed.   BP (!) 142/80   Pulse 80   Ht 5\' 1"  (1.549 m)   Wt 123 lb (55.8 kg)   BMI 23.24 kg/m   Assessment and Plan:  1. Elevated TSH See hypothyroid - TSH  2. Type 2 diabetes mellitus without complication, without long-term current use of insulin (HCC) Chronic Controlled.Will verify with A1C And continue metformen 500mg  bid. - Hemoglobin A1c - metFORMIN (GLUCOPHAGE-XR) 500 MG 24 hr tablet; Take 1 tablet (500 mg total) by mouth daily with breakfast.  Dispense: 60 tablet; Refill: 0  3. Hypothyroidism, unspecified type New established Check TSH and adjust accordingly. - TSH  4. Need for pneumococcal vaccine Discussed and administered. - Pneumococcal conjugate vaccine 13-valent   Dr. Otilio Miu [facility]  08/15/2018

## 2018-08-16 ENCOUNTER — Other Ambulatory Visit: Payer: Self-pay

## 2018-08-16 DIAGNOSIS — R7989 Other specified abnormal findings of blood chemistry: Secondary | ICD-10-CM

## 2018-08-16 LAB — HEMOGLOBIN A1C
ESTIMATED AVERAGE GLUCOSE: 180 mg/dL
Hgb A1c MFr Bld: 7.9 % — ABNORMAL HIGH (ref 4.8–5.6)

## 2018-08-16 LAB — TSH: TSH: 4.27 u[IU]/mL (ref 0.450–4.500)

## 2018-08-16 MED ORDER — LEVOTHYROXINE SODIUM 25 MCG PO TABS: ORAL_TABLET | ORAL | 1 refills | Status: DC

## 2018-09-03 DIAGNOSIS — G8929 Other chronic pain: Secondary | ICD-10-CM | POA: Diagnosis not present

## 2018-09-03 DIAGNOSIS — S82041D Displaced comminuted fracture of right patella, subsequent encounter for closed fracture with routine healing: Secondary | ICD-10-CM | POA: Diagnosis not present

## 2018-09-03 DIAGNOSIS — M25561 Pain in right knee: Secondary | ICD-10-CM | POA: Diagnosis not present

## 2018-09-03 DIAGNOSIS — R262 Difficulty in walking, not elsewhere classified: Secondary | ICD-10-CM | POA: Diagnosis not present

## 2018-09-03 DIAGNOSIS — M6281 Muscle weakness (generalized): Secondary | ICD-10-CM | POA: Diagnosis not present

## 2018-09-16 DIAGNOSIS — G8929 Other chronic pain: Secondary | ICD-10-CM | POA: Diagnosis not present

## 2018-09-16 DIAGNOSIS — M6281 Muscle weakness (generalized): Secondary | ICD-10-CM | POA: Diagnosis not present

## 2018-09-16 DIAGNOSIS — M25561 Pain in right knee: Secondary | ICD-10-CM | POA: Diagnosis not present

## 2018-09-16 DIAGNOSIS — R262 Difficulty in walking, not elsewhere classified: Secondary | ICD-10-CM | POA: Diagnosis not present

## 2018-09-16 DIAGNOSIS — S82041D Displaced comminuted fracture of right patella, subsequent encounter for closed fracture with routine healing: Secondary | ICD-10-CM | POA: Diagnosis not present

## 2018-09-18 DIAGNOSIS — R262 Difficulty in walking, not elsewhere classified: Secondary | ICD-10-CM | POA: Diagnosis not present

## 2018-09-18 DIAGNOSIS — M6281 Muscle weakness (generalized): Secondary | ICD-10-CM | POA: Diagnosis not present

## 2018-09-18 DIAGNOSIS — S82041D Displaced comminuted fracture of right patella, subsequent encounter for closed fracture with routine healing: Secondary | ICD-10-CM | POA: Diagnosis not present

## 2018-09-18 DIAGNOSIS — G8929 Other chronic pain: Secondary | ICD-10-CM | POA: Diagnosis not present

## 2018-09-18 DIAGNOSIS — M25561 Pain in right knee: Secondary | ICD-10-CM | POA: Diagnosis not present

## 2018-09-23 DIAGNOSIS — S82041D Displaced comminuted fracture of right patella, subsequent encounter for closed fracture with routine healing: Secondary | ICD-10-CM | POA: Diagnosis not present

## 2018-09-23 DIAGNOSIS — R262 Difficulty in walking, not elsewhere classified: Secondary | ICD-10-CM | POA: Diagnosis not present

## 2018-09-23 DIAGNOSIS — G8929 Other chronic pain: Secondary | ICD-10-CM | POA: Diagnosis not present

## 2018-09-23 DIAGNOSIS — M6281 Muscle weakness (generalized): Secondary | ICD-10-CM | POA: Diagnosis not present

## 2018-09-23 DIAGNOSIS — M25561 Pain in right knee: Secondary | ICD-10-CM | POA: Diagnosis not present

## 2018-09-30 ENCOUNTER — Encounter: Payer: Self-pay | Admitting: Family Medicine

## 2018-09-30 ENCOUNTER — Ambulatory Visit (INDEPENDENT_AMBULATORY_CARE_PROVIDER_SITE_OTHER): Payer: Medicare Other | Admitting: Family Medicine

## 2018-09-30 VITALS — BP 110/60 | HR 76 | Ht 61.0 in | Wt 122.0 lb

## 2018-09-30 DIAGNOSIS — E119 Type 2 diabetes mellitus without complications: Secondary | ICD-10-CM

## 2018-09-30 MED ORDER — METFORMIN HCL ER 500 MG PO TB24: 500.0000 mg | ORAL_TABLET | Freq: Every day | ORAL | 1 refills | Status: DC

## 2018-09-30 NOTE — Progress Notes (Signed)
Date:  09/30/2018   Name:  Patricia Lawson   DOB:  04-May-1946   MRN:  [id number]   Chief Complaint: Diabetes (recheck A1C) Diabetes  She presents for her follow-up diabetic visit. She has type 2 diabetes mellitus. Her disease course has been stable. There are no hypoglycemic associated symptoms. Pertinent negatives for hypoglycemia include no confusion, dizziness, headaches, hunger, mood changes, nervousness/anxiousness, pallor, seizures, sleepiness, speech difficulty, sweats or tremors. Pertinent negatives for diabetes include no blurred vision, no chest pain, no fatigue, no foot paresthesias, no foot ulcerations, no polydipsia, no polyphagia, no polyuria, no visual change, no weakness and no weight loss. There are no hypoglycemic complications. Symptoms are stable. There are no diabetic complications.     Review of Systems  Constitutional: Negative for fatigue and weight loss.  Eyes: Negative for blurred vision.  Cardiovascular: Negative for chest pain.  Endocrine: Negative for polydipsia, polyphagia and polyuria.  Skin: Negative for pallor.  Neurological: Negative for dizziness, tremors, seizures, speech difficulty, weakness and headaches.  Psychiatric/Behavioral: Negative for confusion. The patient is not nervous/anxious.     Patient Active Problem List   Diagnosis Date Noted  . Fall 06/21/2018  . RUE weakness 06/20/2018  . Facial laceration 06/20/2018  . HTN (hypertension) 06/20/2018  . CAD (coronary artery disease) 06/20/2018  . UTI (urinary tract infection) 06/20/2018  . Patella fracture 04/05/2018  . Appendicitis, acute 03/04/2015    No Known Allergies  Past Surgical History:  Procedure Laterality Date  . LAPAROSCOPIC APPENDECTOMY N/A 03/04/2015   Procedure: APPENDECTOMY LAPAROSCOPIC;  Surgeon: Sherri Rad, MD;  Location: [facility] ORS;  Service: General;  Laterality: N/A;  . ORIF PATELLA Right 04/06/2018   Procedure: OPEN REDUCTION INTERNAL (ORIF) FIXATION PATELLA;   Surgeon: Leim Fabry, MD;  Location: [facility] ORS;  Service: Orthopedics;  Laterality: Right;  . STENT PLACE LEFT URETER ([place] HX)     cardiac  . THYROIDECTOMY Bilateral     Social History   Tobacco Use  . Smoking status: Former Smoker    Types: Cigarettes    Last attempt to quit: 03/03/2009    Years since quitting: 9.5  . Smokeless tobacco: Never Used  Substance Use Topics  . Alcohol use: No  . Drug use: Never     Medication list has been reviewed and updated.  Current Meds  Medication Sig  . aspirin EC 81 MG EC tablet Take 1 tablet (81 mg total) by mouth daily.  Marland Kitchen atorvastatin (LIPITOR) 80 MG tablet Take 80 mg by mouth daily. Dr Clayborn Bigness  . irbesartan (AVAPRO) 75 MG tablet Take 75 mg by mouth daily. Dr Clayborn Bigness  . levothyroxine (SYNTHROID, LEVOTHROID) 25 MCG tablet TAKE 1 TABLET BY MOUTH ONCE DAILY BEFORE BREAKFAST  . metFORMIN (GLUCOPHAGE-XR) 500 MG 24 hr tablet Take 1 tablet (500 mg total) by mouth daily with breakfast.  . metoprolol succinate (TOPROL-XL) 100 MG 24 hr tablet Take 100 mg by mouth daily. Dr Clayborn Bigness  . Multiple Vitamin (MULTIVITAMIN) tablet Take 1 tablet by mouth daily.    PHQ 2/9 Scores 08/15/2018 07/02/2018  PHQ - 2 Score 0 4  PHQ- 9 Score 0 13    Physical Exam  BP 110/60   Pulse 76   Ht 5\' 1"  (1.549 m)   Wt 122 lb (55.3 kg)   BMI 23.05 kg/m   Assessment and Plan:  1. Type 2 diabetes mellitus without complication, without long-term current use of insulin (HCC) Chronic.  Con trolled.  Last visit patient's A1c went from  10-7 range on metformin 500 mg XL.  Will recheck A1c and obtain microalbuminuria.  Metformin 500 XL 1 a day refilled. - HgB A1c - Microalbumin, urine   Dr. [facility]  09/30/2018

## 2018-10-01 DIAGNOSIS — M25561 Pain in right knee: Secondary | ICD-10-CM | POA: Diagnosis not present

## 2018-10-01 DIAGNOSIS — S82041D Displaced comminuted fracture of right patella, subsequent encounter for closed fracture with routine healing: Secondary | ICD-10-CM | POA: Diagnosis not present

## 2018-10-01 DIAGNOSIS — G8929 Other chronic pain: Secondary | ICD-10-CM | POA: Diagnosis not present

## 2018-10-01 DIAGNOSIS — R262 Difficulty in walking, not elsewhere classified: Secondary | ICD-10-CM | POA: Diagnosis not present

## 2018-10-01 DIAGNOSIS — M6281 Muscle weakness (generalized): Secondary | ICD-10-CM | POA: Diagnosis not present

## 2018-10-01 LAB — MICROALBUMIN, URINE: MICROALBUM., U, RANDOM: 47.4 ug/mL

## 2018-10-01 LAB — HEMOGLOBIN A1C
Est. average glucose Bld gHb Est-mCnc: 131 mg/dL
Hgb A1c MFr Bld: 6.2 % — ABNORMAL HIGH (ref 4.8–5.6)

## 2018-10-03 ENCOUNTER — Ambulatory Visit
Admission: EM | Admit: 2018-10-03 | Discharge: 2018-10-03 | Disposition: A | Discharge status: Home / Self Care | Payer: Medicare Other | Attending: Family Medicine | Admitting: Family Medicine

## 2018-10-03 DIAGNOSIS — M25561 Pain in right knee: Secondary | ICD-10-CM | POA: Diagnosis not present

## 2018-10-03 DIAGNOSIS — R262 Difficulty in walking, not elsewhere classified: Secondary | ICD-10-CM | POA: Diagnosis not present

## 2018-10-03 DIAGNOSIS — M21371 Foot drop, right foot: Secondary | ICD-10-CM

## 2018-10-03 DIAGNOSIS — S82041D Displaced comminuted fracture of right patella, subsequent encounter for closed fracture with routine healing: Secondary | ICD-10-CM | POA: Diagnosis not present

## 2018-10-03 DIAGNOSIS — G8929 Other chronic pain: Secondary | ICD-10-CM | POA: Diagnosis not present

## 2018-10-03 DIAGNOSIS — M6281 Muscle weakness (generalized): Secondary | ICD-10-CM | POA: Diagnosis not present

## 2018-10-03 NOTE — Discharge Instructions (Signed)
Wear the brace to keep your ankle in a neutral position so you do not trip and fall.  Use a walker for stability.  Follow up with your orthopedic surgeon soon as possible

## 2018-10-03 NOTE — ED Provider Notes (Signed)
[facility]    CSN: [id number] Arrival date & time: 10/03/18  1652     History   Chief Complaint Chief Complaint  Patient presents with  . Foot Problem    HPI Patricia Lawson is a 72 y.o. female.   HPI  72 year old female presents with her daughter stating that she has been dragging her right foot for about a week.  She went to physical therapy today and the physical therapist told her that she has a dropfoot so she wanted to have this confirmed.  States that she feels a numbness over the dorsum of her foot no tingling.  She had a stroke about 6 months ago with no significant residuals.  Significant past history is a recent patellar fracture requiring ORIF.  Is been attending physical therapy for this.  She was also diagnosed recently with a possible plantar fasciitis of the right foot.        Past Medical History:  Diagnosis Date  . CHF (congestive heart failure) ([place]) 2010  . Coronary artery disease   . Diabetes mellitus without complication ([place])   . Hyperlipidemia   . Hypertension   . Stroke [facility])     Patient Active Problem List   Diagnosis Date Noted  . Fall 06/21/2018  . RUE weakness 06/20/2018  . Facial laceration 06/20/2018  . HTN (hypertension) 06/20/2018  . CAD (coronary artery disease) 06/20/2018  . UTI (urinary tract infection) 06/20/2018  . Patella fracture 04/05/2018  . Appendicitis, acute 03/04/2015    Past Surgical History:  Procedure Laterality Date  . LAPAROSCOPIC APPENDECTOMY N/A 03/04/2015   Procedure: APPENDECTOMY LAPAROSCOPIC;  Surgeon: Sherri Rad, MD;  Location: [facility] ORS;  Service: General;  Laterality: N/A;  . ORIF PATELLA Right 04/06/2018   Procedure: OPEN REDUCTION INTERNAL (ORIF) FIXATION PATELLA;  Surgeon: Leim Fabry, MD;  Location: [facility] ORS;  Service: Orthopedics;  Laterality: Right;  . STENT PLACE LEFT URETER ([place] HX)     cardiac  . THYROIDECTOMY Bilateral     OB History   None      Home Medications    Prior  to Admission medications   Medication Sig Start Date End Date Taking? Authorizing Provider  acetaminophen (TYLENOL) 500 MG tablet Take 2 tablets (1,000 mg total) by mouth every 8 (eight) hours. 04/07/18  Yes Reche Dixon, PA-C  aspirin EC 81 MG EC tablet Take 1 tablet (81 mg total) by mouth daily. 06/22/18  Yes Fritzi Mandes, MD  atorvastatin (LIPITOR) 80 MG tablet Take 80 mg by mouth daily. Dr Clayborn Bigness   Yes [provider]  irbesartan (AVAPRO) 75 MG tablet Take 75 mg by mouth daily. Dr Clayborn Bigness   Yes [provider]  levothyroxine (SYNTHROID, LEVOTHROID) 25 MCG tablet TAKE 1 TABLET BY MOUTH ONCE DAILY BEFORE BREAKFAST 08/16/18  Yes Juline Patch, MD  metFORMIN (GLUCOPHAGE-XR) 500 MG 24 hr tablet Take 1 tablet (500 mg total) by mouth daily with breakfast. 09/30/18  Yes Juline Patch, MD  metoprolol succinate (TOPROL-XL) 100 MG 24 hr tablet Take 100 mg by mouth daily. Dr Clayborn Bigness   Yes [provider]  Multiple Vitamin (MULTIVITAMIN) tablet Take 1 tablet by mouth daily.   Yes [provider]    Family History Family History  Problem Relation Age of Onset  . Heart block Mother   . Heart disease Mother   . Hypertension Mother   . Heart block Father   . Heart disease Father   . Hypertension Father  Social History Social History   Tobacco Use  . Smoking status: Former Smoker    Types: Cigarettes    Last attempt to quit: 03/03/2009    Years since quitting: 9.5  . Smokeless tobacco: Never Used  Substance Use Topics  . Alcohol use: No  . Drug use: Never     Allergies   Patient has no known allergies.   Review of Systems Review of Systems  Constitutional: Positive for activity change. Negative for appetite change, chills, fatigue and fever.  Neurological: Positive for numbness.  All other systems reviewed and are negative.    Physical Exam Triage Vital Signs ED Triage Vitals  Enc Vitals Group     BP 10/03/18 1708 133/70     Pulse Rate  10/03/18 1708 67     Resp 10/03/18 1708 18     Temp 10/03/18 1708 97.8 F (36.6 C)     Temp Source 10/03/18 1708 Oral     SpO2 10/03/18 1708 100 %     Weight 10/03/18 1710 122 lb (55.3 kg)     Height --      Head Circumference --      Peak Flow --      Pain Score 10/03/18 1710 0     Pain Loc --      Pain Edu? --      Excl. in [place]? --    No data found.  Updated Vital Signs BP 133/70 (BP Location: Left Arm)   Pulse 67   Temp 97.8 F (36.6 C) (Oral)   Resp 18   Wt 122 lb (55.3 kg)   SpO2 100%   BMI 23.05 kg/m   Visual Acuity Right Eye Distance:   Left Eye Distance:   Bilateral Distance:    Right Eye Near:   Left Eye Near:    Bilateral Near:     Physical Exam  Constitutional: She is oriented to person, place, and time. She appears well-developed and well-nourished. No distress.  HENT:  Head: Normocephalic.  Eyes: Pupils are equal, round, and reactive to light. Right eye exhibits no discharge. Left eye exhibits no discharge.  Neck: Normal range of motion.  Musculoskeletal:  Examination of the right foot and leg shows a negative Tinel's at the fibular head.  She has good range of motion of the knee.  Good range of motion of the ankle.  However she is unable to dorsiflex her foot past to -10 degrees of neutral.  Has a hip esthesia to light touch over the dorsum of the foot.  Neurological: She is alert and oriented to person, place, and time. A sensory deficit is present.  Skin: Skin is warm and dry. She is not diaphoretic.  Psychiatric: She has a normal mood and affect. Her behavior is normal. Judgment and thought content normal.  Nursing note and vitals reviewed.    UC Treatments / Results  Labs (all labs ordered are listed, but only abnormal results are displayed) Labs Reviewed - No data to display  EKG None  Radiology No results found.  Procedures Procedures (including critical care time)  Medications Ordered in UC Medications - No data to  display  Initial Impression / Assessment and Plan / UC Course  I have reviewed the triage vital signs and the nursing notes.  Pertinent labs & imaging results that were available during my care of the patient were reviewed by me and considered in my medical decision making (see chart for details).   From examination today  the patient does have a partial foot drop .has not tripped at the present time.  I will put her in a splint to help keep her ankle in a neutral position since I do not have an AFO available.  Recommended that she use a walker for ambulation so to give her more stability.  I recommend that she call the orthopedist tomorrow for follow-up appointment as soon as possible to find out where the entrapment is occurring.   Final Clinical Impressions(s) / UC Diagnoses   Final diagnoses:  Acquired right foot drop     Discharge Instructions     Wear the brace to keep your ankle in a neutral position so you do not trip and fall.  Use a walker for stability.  Follow up with your orthopedic surgeon soon as possible    ED Prescriptions    None     Controlled Substance Prescriptions [place] Controlled Substance Registry consulted? Not Applicable   Lorin Picket, PA-C 10/03/18 1946

## 2018-10-03 NOTE — ED Triage Notes (Signed)
Pt states she has been dragging her foot for 1 week and did go see her physical therapist today and he states it's "dropped foot" so she wanted to get it checked out. States it does feel numb right now but no tingling reported. Did have a stroke 6 months ago

## 2018-10-07 DIAGNOSIS — G8929 Other chronic pain: Secondary | ICD-10-CM | POA: Diagnosis not present

## 2018-10-07 DIAGNOSIS — M6281 Muscle weakness (generalized): Secondary | ICD-10-CM | POA: Diagnosis not present

## 2018-10-07 DIAGNOSIS — R262 Difficulty in walking, not elsewhere classified: Secondary | ICD-10-CM | POA: Diagnosis not present

## 2018-10-07 DIAGNOSIS — S82041D Displaced comminuted fracture of right patella, subsequent encounter for closed fracture with routine healing: Secondary | ICD-10-CM | POA: Diagnosis not present

## 2018-10-07 DIAGNOSIS — M25561 Pain in right knee: Secondary | ICD-10-CM | POA: Diagnosis not present

## 2018-10-09 DIAGNOSIS — M6281 Muscle weakness (generalized): Secondary | ICD-10-CM | POA: Diagnosis not present

## 2018-10-09 DIAGNOSIS — S82041D Displaced comminuted fracture of right patella, subsequent encounter for closed fracture with routine healing: Secondary | ICD-10-CM | POA: Diagnosis not present

## 2018-10-09 DIAGNOSIS — R262 Difficulty in walking, not elsewhere classified: Secondary | ICD-10-CM | POA: Diagnosis not present

## 2018-10-09 DIAGNOSIS — M25561 Pain in right knee: Secondary | ICD-10-CM | POA: Diagnosis not present

## 2018-10-09 DIAGNOSIS — G8929 Other chronic pain: Secondary | ICD-10-CM | POA: Diagnosis not present

## 2018-10-14 DIAGNOSIS — R262 Difficulty in walking, not elsewhere classified: Secondary | ICD-10-CM | POA: Diagnosis not present

## 2018-10-14 DIAGNOSIS — M6281 Muscle weakness (generalized): Secondary | ICD-10-CM | POA: Diagnosis not present

## 2018-10-14 DIAGNOSIS — S82041D Displaced comminuted fracture of right patella, subsequent encounter for closed fracture with routine healing: Secondary | ICD-10-CM | POA: Diagnosis not present

## 2018-10-14 DIAGNOSIS — G8929 Other chronic pain: Secondary | ICD-10-CM | POA: Diagnosis not present

## 2018-10-14 DIAGNOSIS — M25561 Pain in right knee: Secondary | ICD-10-CM | POA: Diagnosis not present

## 2018-10-15 DIAGNOSIS — M21371 Foot drop, right foot: Secondary | ICD-10-CM | POA: Diagnosis not present

## 2018-10-16 DIAGNOSIS — G8929 Other chronic pain: Secondary | ICD-10-CM | POA: Diagnosis not present

## 2018-10-16 DIAGNOSIS — M25561 Pain in right knee: Secondary | ICD-10-CM | POA: Diagnosis not present

## 2018-10-16 DIAGNOSIS — M6281 Muscle weakness (generalized): Secondary | ICD-10-CM | POA: Diagnosis not present

## 2018-10-16 DIAGNOSIS — R262 Difficulty in walking, not elsewhere classified: Secondary | ICD-10-CM | POA: Diagnosis not present

## 2018-10-16 DIAGNOSIS — S82041D Displaced comminuted fracture of right patella, subsequent encounter for closed fracture with routine healing: Secondary | ICD-10-CM | POA: Diagnosis not present

## 2018-10-17 ENCOUNTER — Other Ambulatory Visit: Payer: Self-pay | Admitting: Family Medicine

## 2018-10-17 DIAGNOSIS — R7989 Other specified abnormal findings of blood chemistry: Secondary | ICD-10-CM

## 2018-10-21 DIAGNOSIS — R262 Difficulty in walking, not elsewhere classified: Secondary | ICD-10-CM | POA: Diagnosis not present

## 2018-10-21 DIAGNOSIS — M6281 Muscle weakness (generalized): Secondary | ICD-10-CM | POA: Diagnosis not present

## 2018-10-21 DIAGNOSIS — S82041D Displaced comminuted fracture of right patella, subsequent encounter for closed fracture with routine healing: Secondary | ICD-10-CM | POA: Diagnosis not present

## 2018-10-21 DIAGNOSIS — G8929 Other chronic pain: Secondary | ICD-10-CM | POA: Diagnosis not present

## 2018-10-21 DIAGNOSIS — M25561 Pain in right knee: Secondary | ICD-10-CM | POA: Diagnosis not present

## 2018-10-25 DIAGNOSIS — M25561 Pain in right knee: Secondary | ICD-10-CM | POA: Diagnosis not present

## 2018-10-25 DIAGNOSIS — S82041D Displaced comminuted fracture of right patella, subsequent encounter for closed fracture with routine healing: Secondary | ICD-10-CM | POA: Diagnosis not present

## 2018-10-25 DIAGNOSIS — M6281 Muscle weakness (generalized): Secondary | ICD-10-CM | POA: Diagnosis not present

## 2018-10-25 DIAGNOSIS — G8929 Other chronic pain: Secondary | ICD-10-CM | POA: Diagnosis not present

## 2018-10-25 DIAGNOSIS — R262 Difficulty in walking, not elsewhere classified: Secondary | ICD-10-CM | POA: Diagnosis not present

## 2018-10-28 DIAGNOSIS — S82041D Displaced comminuted fracture of right patella, subsequent encounter for closed fracture with routine healing: Secondary | ICD-10-CM | POA: Diagnosis not present

## 2018-10-28 DIAGNOSIS — R262 Difficulty in walking, not elsewhere classified: Secondary | ICD-10-CM | POA: Diagnosis not present

## 2018-10-28 DIAGNOSIS — M6281 Muscle weakness (generalized): Secondary | ICD-10-CM | POA: Diagnosis not present

## 2018-10-28 DIAGNOSIS — G8929 Other chronic pain: Secondary | ICD-10-CM | POA: Diagnosis not present

## 2018-10-28 DIAGNOSIS — M25561 Pain in right knee: Secondary | ICD-10-CM | POA: Diagnosis not present

## 2018-10-31 DIAGNOSIS — R262 Difficulty in walking, not elsewhere classified: Secondary | ICD-10-CM | POA: Diagnosis not present

## 2018-10-31 DIAGNOSIS — M25561 Pain in right knee: Secondary | ICD-10-CM | POA: Diagnosis not present

## 2018-10-31 DIAGNOSIS — M6281 Muscle weakness (generalized): Secondary | ICD-10-CM | POA: Diagnosis not present

## 2018-10-31 DIAGNOSIS — S82041D Displaced comminuted fracture of right patella, subsequent encounter for closed fracture with routine healing: Secondary | ICD-10-CM | POA: Diagnosis not present

## 2018-10-31 DIAGNOSIS — G8929 Other chronic pain: Secondary | ICD-10-CM | POA: Diagnosis not present

## 2018-11-04 DIAGNOSIS — G8929 Other chronic pain: Secondary | ICD-10-CM | POA: Diagnosis not present

## 2018-11-04 DIAGNOSIS — M25561 Pain in right knee: Secondary | ICD-10-CM | POA: Diagnosis not present

## 2018-11-04 DIAGNOSIS — R262 Difficulty in walking, not elsewhere classified: Secondary | ICD-10-CM | POA: Diagnosis not present

## 2018-11-04 DIAGNOSIS — S82041D Displaced comminuted fracture of right patella, subsequent encounter for closed fracture with routine healing: Secondary | ICD-10-CM | POA: Diagnosis not present

## 2018-11-04 DIAGNOSIS — M6281 Muscle weakness (generalized): Secondary | ICD-10-CM | POA: Diagnosis not present

## 2018-11-05 DIAGNOSIS — S82041D Displaced comminuted fracture of right patella, subsequent encounter for closed fracture with routine healing: Secondary | ICD-10-CM | POA: Diagnosis not present

## 2018-11-11 DIAGNOSIS — M25561 Pain in right knee: Secondary | ICD-10-CM | POA: Diagnosis not present

## 2018-11-11 DIAGNOSIS — G8929 Other chronic pain: Secondary | ICD-10-CM | POA: Diagnosis not present

## 2018-11-11 DIAGNOSIS — R262 Difficulty in walking, not elsewhere classified: Secondary | ICD-10-CM | POA: Diagnosis not present

## 2018-11-11 DIAGNOSIS — M6281 Muscle weakness (generalized): Secondary | ICD-10-CM | POA: Diagnosis not present

## 2018-11-11 DIAGNOSIS — S82041D Displaced comminuted fracture of right patella, subsequent encounter for closed fracture with routine healing: Secondary | ICD-10-CM | POA: Diagnosis not present

## 2018-11-13 DIAGNOSIS — M21371 Foot drop, right foot: Secondary | ICD-10-CM | POA: Diagnosis not present

## 2018-11-21 ENCOUNTER — Ambulatory Visit
Admission: RE | Admit: 2018-11-21 | Discharge: 2018-11-21 | Disposition: A | Discharge status: Home / Self Care | Payer: Medicare Other | Source: Ambulatory Visit | Attending: Orthopedic Surgery | Admitting: Orthopedic Surgery

## 2018-11-21 ENCOUNTER — Other Ambulatory Visit: Payer: Self-pay | Admitting: Orthopedic Surgery

## 2018-11-21 DIAGNOSIS — M21371 Foot drop, right foot: Secondary | ICD-10-CM | POA: Insufficient documentation

## 2018-11-21 DIAGNOSIS — S83511A Sprain of anterior cruciate ligament of right knee, initial encounter: Secondary | ICD-10-CM | POA: Diagnosis not present

## 2018-11-21 DIAGNOSIS — S83521A Sprain of posterior cruciate ligament of right knee, initial encounter: Secondary | ICD-10-CM | POA: Diagnosis not present

## 2018-11-21 DIAGNOSIS — R600 Localized edema: Secondary | ICD-10-CM | POA: Diagnosis not present

## 2018-11-21 LAB — POCT I-STAT CREATININE: Creatinine, Ser: 0.9 mg/dL (ref 0.44–1.00)

## 2018-11-21 MED ORDER — GADOBUTROL 1 MMOL/ML IV SOLN
5.0000 mL | Freq: Once | INTRAVENOUS | Status: AC | PRN
  Administered 2018-11-21: 5 mL via INTRAVENOUS

## 2018-11-25 DIAGNOSIS — Z8781 Personal history of (healed) traumatic fracture: Secondary | ICD-10-CM

## 2018-11-25 DIAGNOSIS — G5731 Lesion of lateral popliteal nerve, right lower limb: Secondary | ICD-10-CM | POA: Insufficient documentation

## 2018-11-25 HISTORY — DX: Personal history of (healed) traumatic fracture: Z87.81

## 2018-12-02 ENCOUNTER — Encounter: Payer: Self-pay | Admitting: *Deleted

## 2018-12-02 ENCOUNTER — Other Ambulatory Visit: Payer: Self-pay

## 2018-12-11 ENCOUNTER — Ambulatory Visit
Admission: RE | Admit: 2018-12-11 | Discharge: 2018-12-11 | Disposition: A | Discharge status: Home / Self Care | Payer: Medicare Other | Source: Ambulatory Visit | Attending: Surgery | Admitting: Surgery

## 2018-12-11 ENCOUNTER — Encounter
Admission: RE | Disposition: A | Discharge status: Home / Self Care | Payer: Self-pay | Source: Ambulatory Visit | Attending: Surgery

## 2018-12-11 ENCOUNTER — Ambulatory Visit: Payer: Medicare Other | Admitting: Anesthesiology

## 2018-12-11 DIAGNOSIS — Z87891 Personal history of nicotine dependence: Secondary | ICD-10-CM | POA: Insufficient documentation

## 2018-12-11 DIAGNOSIS — Z79899 Other long term (current) drug therapy: Secondary | ICD-10-CM | POA: Diagnosis not present

## 2018-12-11 DIAGNOSIS — Z7984 Long term (current) use of oral hypoglycemic drugs: Secondary | ICD-10-CM | POA: Insufficient documentation

## 2018-12-11 DIAGNOSIS — I10 Essential (primary) hypertension: Secondary | ICD-10-CM | POA: Diagnosis not present

## 2018-12-11 DIAGNOSIS — E039 Hypothyroidism, unspecified: Secondary | ICD-10-CM | POA: Diagnosis not present

## 2018-12-11 DIAGNOSIS — I252 Old myocardial infarction: Secondary | ICD-10-CM | POA: Insufficient documentation

## 2018-12-11 DIAGNOSIS — I6930 Unspecified sequelae of cerebral infarction: Secondary | ICD-10-CM | POA: Diagnosis not present

## 2018-12-11 DIAGNOSIS — T8484XA Pain due to internal orthopedic prosthetic devices, implants and grafts, initial encounter: Secondary | ICD-10-CM

## 2018-12-11 DIAGNOSIS — E119 Type 2 diabetes mellitus without complications: Secondary | ICD-10-CM | POA: Diagnosis not present

## 2018-12-11 DIAGNOSIS — Z955 Presence of coronary angioplasty implant and graft: Secondary | ICD-10-CM | POA: Diagnosis not present

## 2018-12-11 DIAGNOSIS — Y831 Surgical operation with implant of artificial internal device as the cause of abnormal reaction of the patient, or of later complication, without mention of misadventure at the time of the procedure: Secondary | ICD-10-CM | POA: Diagnosis not present

## 2018-12-11 DIAGNOSIS — Z79890 Hormone replacement therapy (postmenopausal): Secondary | ICD-10-CM | POA: Insufficient documentation

## 2018-12-11 DIAGNOSIS — Z7982 Long term (current) use of aspirin: Secondary | ICD-10-CM | POA: Insufficient documentation

## 2018-12-11 DIAGNOSIS — G5731 Lesion of lateral popliteal nerve, right lower limb: Secondary | ICD-10-CM | POA: Diagnosis not present

## 2018-12-11 DIAGNOSIS — E785 Hyperlipidemia, unspecified: Secondary | ICD-10-CM | POA: Insufficient documentation

## 2018-12-11 DIAGNOSIS — I2510 Atherosclerotic heart disease of native coronary artery without angina pectoris: Secondary | ICD-10-CM | POA: Insufficient documentation

## 2018-12-11 HISTORY — DX: Acute myocardial infarction, unspecified: I21.9

## 2018-12-11 HISTORY — PX: HARDWARE REMOVAL: SHX979

## 2018-12-11 HISTORY — DX: Pain due to internal orthopedic prosthetic devices, implants and grafts, initial encounter: T84.84XA

## 2018-12-11 HISTORY — PX: TENDON EXPLORATION: SHX5112

## 2018-12-11 LAB — GLUCOSE, CAPILLARY
GLUCOSE-CAPILLARY: 109 mg/dL — AB (ref 70–99)
Glucose-Capillary: 102 mg/dL — ABNORMAL HIGH (ref 70–99)

## 2018-12-11 SURGERY — REMOVAL, HARDWARE
Anesthesia: General | Site: Leg Lower | Laterality: Right

## 2018-12-11 MED ORDER — OXYCODONE HCL 5 MG PO TABS: 5.0000 mg | ORAL_TABLET | Freq: Once | ORAL | Status: DC | PRN

## 2018-12-11 MED ORDER — GLYCOPYRROLATE 0.2 MG/ML IJ SOLN
INTRAMUSCULAR | Status: DC | PRN
  Administered 2018-12-11: .1 mg via INTRAVENOUS

## 2018-12-11 MED ORDER — POTASSIUM CHLORIDE IN NACL 20-0.9 MEQ/L-% IV SOLN: INTRAVENOUS | Status: DC

## 2018-12-11 MED ORDER — OXYCODONE HCL 5 MG/5ML PO SOLN: 5.0000 mg | Freq: Once | ORAL | Status: DC | PRN

## 2018-12-11 MED ORDER — LIDOCAINE HCL (CARDIAC) PF 100 MG/5ML IV SOSY
PREFILLED_SYRINGE | INTRAVENOUS | Status: DC | PRN
  Administered 2018-12-11: 40 mg via INTRATRACHEAL

## 2018-12-11 MED ORDER — ACETAMINOPHEN 325 MG PO TABS
650.0000 mg | ORAL_TABLET | Freq: Once | ORAL | Status: AC
  Administered 2018-12-11: 650 mg via ORAL

## 2018-12-11 MED ORDER — DEXAMETHASONE SODIUM PHOSPHATE 4 MG/ML IJ SOLN
INTRAMUSCULAR | Status: DC | PRN
  Administered 2018-12-11: 4 mg via INTRAVENOUS

## 2018-12-11 MED ORDER — MIDAZOLAM HCL 5 MG/5ML IJ SOLN
INTRAMUSCULAR | Status: DC | PRN
  Administered 2018-12-11: 2 mg via INTRAVENOUS

## 2018-12-11 MED ORDER — ONDANSETRON HCL 4 MG PO TABS: 4.0000 mg | ORAL_TABLET | Freq: Four times a day (QID) | ORAL | Status: DC | PRN

## 2018-12-11 MED ORDER — ONDANSETRON HCL 4 MG/2ML IJ SOLN: 4.0000 mg | Freq: Four times a day (QID) | INTRAMUSCULAR | Status: DC | PRN

## 2018-12-11 MED ORDER — METOCLOPRAMIDE HCL 5 MG/ML IJ SOLN: 5.0000 mg | Freq: Three times a day (TID) | INTRAMUSCULAR | Status: DC | PRN

## 2018-12-11 MED ORDER — FENTANYL CITRATE (PF) 100 MCG/2ML IJ SOLN
INTRAMUSCULAR | Status: DC | PRN
  Administered 2018-12-11: 12.5 ug via INTRAVENOUS
  Administered 2018-12-11: 25 ug via INTRAVENOUS

## 2018-12-11 MED ORDER — EPHEDRINE SULFATE 50 MG/ML IJ SOLN
INTRAMUSCULAR | Status: DC | PRN
  Administered 2018-12-11 (×3): 5 mg via INTRAVENOUS

## 2018-12-11 MED ORDER — LACTATED RINGERS IV SOLN
INTRAVENOUS | Status: DC
  Administered 2018-12-11: 12:00:00 via INTRAVENOUS

## 2018-12-11 MED ORDER — OXYCODONE HCL 5 MG PO CAPS: 5.0000 mg | ORAL_CAPSULE | ORAL | 0 refills | Status: DC | PRN

## 2018-12-11 MED ORDER — BUPIVACAINE HCL (PF) 0.5 % IJ SOLN
INTRAMUSCULAR | Status: DC | PRN
  Administered 2018-12-11: 10 mL

## 2018-12-11 MED ORDER — ONDANSETRON HCL 4 MG/2ML IJ SOLN
INTRAMUSCULAR | Status: DC | PRN
  Administered 2018-12-11: 4 mg via INTRAVENOUS

## 2018-12-11 MED ORDER — PROPOFOL 10 MG/ML IV BOLUS
INTRAVENOUS | Status: DC | PRN
  Administered 2018-12-11: 100 mg via INTRAVENOUS

## 2018-12-11 MED ORDER — METOCLOPRAMIDE HCL 5 MG PO TABS: 5.0000 mg | ORAL_TABLET | Freq: Three times a day (TID) | ORAL | Status: DC | PRN

## 2018-12-11 MED ORDER — OXYCODONE HCL 5 MG PO TABS: 5.0000 mg | ORAL_TABLET | ORAL | Status: DC | PRN

## 2018-12-11 MED ORDER — DEXTROSE 5 % IV SOLN
2000.0000 mg | Freq: Once | INTRAVENOUS | Status: AC
  Administered 2018-12-11: 2000 mg via INTRAVENOUS

## 2018-12-11 SURGICAL SUPPLY — 23 items
BANDAGE ELASTIC 4 LF NS (GAUZE/BANDAGES/DRESSINGS) ×2 IMPLANT
BNDG COHESIVE 6X5 TAN STRL LF (GAUZE/BANDAGES/DRESSINGS) ×2 IMPLANT
CANISTER SUCT 1200ML W/VALVE (MISCELLANEOUS) ×4 IMPLANT
CHLORAPREP W/TINT 26ML (MISCELLANEOUS) ×6 IMPLANT
CORD BIP STRL DISP 12FT (MISCELLANEOUS) ×2 IMPLANT
COVER LIGHT HANDLE UNIVERSAL (MISCELLANEOUS) ×8 IMPLANT
CUFF TOURN SGL QUICK 24 (TOURNIQUET CUFF) ×2
CUFF TRNQT CYL 24X4X40X1 (TOURNIQUET CUFF) IMPLANT
DRAPE U-SHAPE 48X52 POLY STRL (PACKS) ×2 IMPLANT
GAUZE PETRO XEROFOAM 1X8 (MISCELLANEOUS) ×4 IMPLANT
GAUZE SPONGE 4X4 12PLY STRL (GAUZE/BANDAGES/DRESSINGS) ×2 IMPLANT
GLOVE BIO SURGEON STRL SZ8 (GLOVE) ×6 IMPLANT
GLOVE INDICATOR 8.0 STRL GRN (GLOVE) ×4 IMPLANT
GOWN STRL REUS W/ TWL LRG LVL3 (GOWN DISPOSABLE) ×2 IMPLANT
GOWN STRL REUS W/ TWL XL LVL3 (GOWN DISPOSABLE) ×2 IMPLANT
GOWN STRL REUS W/TWL LRG LVL3 (GOWN DISPOSABLE) ×2
GOWN STRL REUS W/TWL XL LVL3 (GOWN DISPOSABLE) ×2
KIT TURNOVER KIT A (KITS) ×4 IMPLANT
NS IRRIG 500ML POUR BTL (IV SOLUTION) ×4 IMPLANT
PACK EXTREMITY ARMC (MISCELLANEOUS) ×4 IMPLANT
STOCKINETTE IMPERVIOUS 9X36 MD (GAUZE/BANDAGES/DRESSINGS) ×2 IMPLANT
SUT PROLENE 4 0 PS 2 18 (SUTURE) ×4 IMPLANT
SUT VIC AB 2-0 CT2 27 (SUTURE) ×2 IMPLANT

## 2018-12-11 NOTE — Op Note (Signed)
12/11/2018  1:52 PM  Patient:   Patricia Lawson  Pre-Op Diagnosis:   1.  Peroneal neuropathy right lower extremity. 2.  Painful retained screw status post ORIF right patella fracture.  Post-Op Diagnosis:   Same.  Procedure:   1.  Exploration and neurolysis of right peroneal nerve at the fibular neck. 2.  Removal of painful retained screw right patella.  Surgeon:   Pascal Lux, MD  Assistant:   None  Anesthesia:   General LMA  Findings:   As above.  Complications:   None  Fluids:   800 cc crystalloid  EBL:   0 cc  UOP:   None  TT:   40 minutes at 300 mmHg  Drains:   None  Closure:   4-0 Prolene interrupted sutures.  Brief Clinical Note:   The patient is a 73 year old female who is now 8 months status post an open reduction and internal fixation of her right patella fracture.  The patient has done well following the procedure and x-rays demonstrate excellent healing of the fracture.  Over the past 1 to 2 months, the patient has noted a painful prominence in the anteromedial aspect of her patella.  X-rays demonstrate that a small screw used for fixation of 1 of the smaller fragments has backed out.  In addition, the patient was noted to have an unrelated progressive dropfoot problem.  Work-up including an EMG has suggested peroneal neuropathy.  The patient presents at this time for removal of the painful retained screw and exploration with neurolysis of the peroneal nerve at the fibular neck.  Procedure:   The patient was brought into the operating room and lain in the supine position.  After adequate general laryngeal mask anesthesia was obtained, the patient's right lower extremity was prepped with ChloraPrep solution before being draped sterilely.  Preoperative antibiotics were administered.  A timeout was performed to verify the appropriate surgical sites before the limb was exsanguinated with an Esmarch and the tourniquet inflated to 300 mmHg.  The painful retained screw  was addressed first.  An approximate 1 cm incision was made along the line of the initial incision over the screw.  Dissection was carried down through the soft subcutaneous tissues to expose the screw head.  As the screw was already partially backed out, it was simply grasped with a large needle driver and and removed by twisting it out.  This wound was irrigated and then reapproximated using several 4-0 Prolene interrupted sutures.  Attention was then directed to the peroneal nerve.  An approximately 6-7 cm incision was made obliquely over the lateral aspect of the leg just distal to the fibular head and extend beginning just posterior to the fibula and extending anteriorly and distally.  The incision was carried down through the subcutaneous tissues to expose the fascia overlying the muscles of the lateral compartment just posterior to these muscles, the peroneal nerve was identified and carefully dissected free of the surrounding soft tissues.  Dissection was carried proximally for several centimeters to be sure that there was no structures compressing the nerve more proximally.  Distally, the lateral compartment fascia was released with care taken to protect the underlying muscle fibers.  These fibers were then retracted anteriorly to permit release of the deeper muscular fascia lying directly over the peroneal nerve.  Care was taken to be sure that the nerve was well protected during this release.  In addition, several small septae dividing the individual muscles in the lateral compartment also  were released.  The nerve was then mobilized superiorly and inferiorly to release the band of fascia just deep to the peroneal nerve.  Again, the nerve was carefully inspected and found to be intact.  The wound was copiously irrigated with sterile saline solution before the subcutaneous tissues were reapproximated using 2-0 Vicryl interrupted sutures.  The skin was closed using 4-0 Prolene interrupted sutures.  A  total of 10 cc of 0.5% plain Sensorcaine was injected in and around both incision sites to help with postoperative analgesia before a sterile bulky dressing was applied to the knee.  The patient was then awakened, extubated, and returned to the recovery room in satisfactory condition after tolerating the procedure well.

## 2018-12-11 NOTE — Anesthesia Preprocedure Evaluation (Signed)
Anesthesia Evaluation  Patient identified by MRN, date of birth, ID band  Reviewed: NPO status   History of Anesthesia Complications Negative for: history of anesthetic complications  Airway Mallampati: II  TM Distance: >3 FB Neck ROM: full    Dental  (+) Missing   Pulmonary neg pulmonary ROS, former smoker,    Pulmonary exam normal        Cardiovascular Exercise Tolerance: Good hypertension, + CAD, + Past MI (2010) and + Cardiac Stents (x5, last 2010)  Normal cardiovascular exam  echo: 05/2018: - Left ventricle: The cavity size was normal. There was mild concentric hypertrophy. Systolic function was normal. The estimated ejection fraction was in the range of 60% to 65%. Wall motion was normal; there were no regional wall motion abnormalities. Doppler parameters are consistent with abnormal left ventricular relaxation (grade 1 diastolic dysfunction). - Aortic valve: There was mild regurgitation.;   cards stable: 06/2018: dr. 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 stable follow-up with orthopedics       Neuro/Psych 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), Residual Symptoms negative psych ROS   GI/Hepatic negative GI ROS, Neg liver ROS,   Endo/Other  diabetesHypothyroidism   Renal/GU negative Renal ROS  negative genitourinary   Musculoskeletal   Abdominal   Peds  Hematology negative hematology ROS (+)    Anesthesia Other Findings Had knee surgery 03/2018;    Reproductive/Obstetrics                             Anesthesia Physical Anesthesia Plan  ASA: III  Anesthesia Plan: General   Post-op Pain Management:    Induction:   PONV Risk Score and Plan:   Airway Management Planned:   Additional Equipment:   Intra-op Plan:   Post-operative Plan:   Informed Consent: I have reviewed the patients History and Physical, chart, labs and discussed the procedure including the risks, benefits and alternatives for the proposed anesthesia with the patient or authorized representative who has indicated his/her understanding and acceptance.       Plan Discussed with: CRNA  Anesthesia Plan Comments:         Anesthesia Quick Evaluation

## 2018-12-11 NOTE — Anesthesia Postprocedure Evaluation (Signed)
Anesthesia Post Note  Patient: Patricia Lawson  Procedure(s) Performed: SCREW REMOVAL FROM PATELLA (Right Knee) EXPLORATION OF PERONEAL NERVE LATERAL KNEE (Right Leg Lower)  Patient location during evaluation: PACU Anesthesia Type: General Level of consciousness: awake and alert Pain management: pain level controlled Vital Signs Assessment: post-procedure vital signs reviewed and stable Respiratory status: spontaneous breathing, nonlabored ventilation, respiratory function stable and patient connected to nasal cannula oxygen Cardiovascular status: blood pressure returned to baseline and stable Postop Assessment: no apparent nausea or vomiting Anesthetic complications: no    Masaji Billups

## 2018-12-11 NOTE — H&P (Signed)
Paper H&P to be scanned into permanent record. H&P reviewed and patient re-examined. No changes. 

## 2018-12-11 NOTE — Anesthesia Procedure Notes (Signed)
Procedure Name: LMA Insertion Date/Time: 12/11/2018 12:51 PM Performed by: Mayme Genta, CRNA Pre-anesthesia Checklist: Patient identified, Emergency Drugs available, Suction available, Timeout performed and Patient being monitored Patient Re-evaluated:Patient Re-evaluated prior to induction Oxygen Delivery Method: Circle system utilized Preoxygenation: Pre-oxygenation with 100% oxygen Induction Type: IV induction LMA: LMA inserted LMA Size: 3.0 Number of attempts: 1 Placement Confirmation: positive ETCO2 and breath sounds checked- equal and bilateral Tube secured with: Tape

## 2018-12-11 NOTE — Transfer of Care (Signed)
Immediate Anesthesia Transfer of Care Note  Patient: Patricia Lawson  Procedure(s) Performed: SCREW REMOVAL FROM PATELLA (Right Knee) EXPLORATION OF PERONEAL NERVE LATERAL KNEE (Right Leg Lower)  Patient Location: PACU  Anesthesia Type: General  Level of Consciousness: awake, alert  and patient cooperative  Airway and Oxygen Therapy: Patient Spontanous Breathing and Patient connected to supplemental oxygen  Post-op Assessment: Post-op Vital signs reviewed, Patient's Cardiovascular Status Stable, Respiratory Function Stable, Patent Airway and No signs of Nausea or vomiting  Post-op Vital Signs: Reviewed and stable  Complications: No apparent anesthesia complications

## 2018-12-11 NOTE — Discharge Instructions (Signed)
General Anesthesia, Adult, Care After °This sheet gives you information about how to care for yourself after your procedure. Your health care provider may also give you more specific instructions. If you have problems or questions, contact your health care provider. °What can I expect after the procedure? °After the procedure, the following side effects are common: °· Pain or discomfort at the IV site. °· Nausea. °· Vomiting. °· Sore throat. °· Trouble concentrating. °· Feeling cold or chills. °· Weak or tired. °· Sleepiness and fatigue. °· Soreness and body aches. These side effects can affect parts of the body that were not involved in surgery. °Follow these instructions at home: ° °For at least 24 hours after the procedure: °· Have a responsible adult stay with you. It is important to have someone help care for you until you are awake and alert. °· Rest as needed. °· Do not: °? Participate in activities in which you could fall or become injured. °? Drive. °? Use heavy machinery. °? Drink alcohol. °? Take sleeping pills or medicines that cause drowsiness. °? Make important decisions or sign legal documents. °? Take care of children on your own. °Eating and drinking °· Follow any instructions from your health care provider about eating or drinking restrictions. °· When you feel hungry, start by eating small amounts of foods that are soft and easy to digest (bland), such as toast. Gradually return to your regular diet. °· Drink enough fluid to keep your urine pale yellow. °· If you vomit, rehydrate by drinking water, juice, or clear broth. °General instructions °· If you have sleep apnea, surgery and certain medicines can increase your risk for breathing problems. Follow instructions from your health care provider about wearing your sleep device: °? Anytime you are sleeping, including during daytime naps. °? While taking prescription pain medicines, sleeping medicines, or medicines that make you drowsy. °· Return to  your normal activities as told by your health care provider. Ask your health care provider what activities are safe for you. °· Take over-the-counter and prescription medicines only as told by your health care provider. °· If you smoke, do not smoke without supervision. °· Keep all follow-up visits as told by your health care provider. This is important. °Contact a health care provider if: °· You have nausea or vomiting that does not get better with medicine. °· You cannot eat or drink without vomiting. °· You have pain that does not get better with medicine. °· You are unable to pass urine. °· You develop a skin rash. °· You have a fever. °· You have redness around your IV site that gets worse. °Get help right away if: °· You have difficulty breathing. °· You have chest pain. °· You have blood in your urine or stool, or you vomit blood. °Summary °· After the procedure, it is common to have a sore throat or nausea. It is also common to feel tired. °· Have a responsible adult stay with you for the first 24 hours after general anesthesia. It is important to have someone help care for you until you are awake and alert. °· When you feel hungry, start by eating small amounts of foods that are soft and easy to digest (bland), such as toast. Gradually return to your regular diet. °· Drink enough fluid to keep your urine pale yellow. °· Return to your normal activities as told by your health care provider. Ask your health care provider what activities are safe for you. °This information is not   intended to replace advice given to you by your health care provider. Make sure you discuss any questions you have with your health care provider. Document Released: 01/22/2001 Document Revised: 06/01/2017 Document Reviewed: 06/01/2017 Elsevier Interactive Patient Education  2019 [place] discharge instructions: Keep dressing dry and intact.  May shower after dressing changed on post-op day #4 (Sunday).    Cover staples/sutures with Band-Aids after drying off. Apply ice frequently to knee. Take ibuprofen 600-800 mg TID with meals for 7-10 days, then as necessary. Take pain medication as prescribed or ES Tylenol when needed.  May weight-bear as tolerated - use cane, crutches, or walker as needed. Follow-up in 10-14 days or as scheduled.

## 2018-12-12 ENCOUNTER — Encounter: Payer: Self-pay | Admitting: Surgery

## 2019-01-07 DIAGNOSIS — R011 Cardiac murmur, unspecified: Secondary | ICD-10-CM | POA: Diagnosis not present

## 2019-01-07 DIAGNOSIS — E782 Mixed hyperlipidemia: Secondary | ICD-10-CM | POA: Diagnosis not present

## 2019-01-07 DIAGNOSIS — I10 Essential (primary) hypertension: Secondary | ICD-10-CM | POA: Diagnosis not present

## 2019-01-07 DIAGNOSIS — E079 Disorder of thyroid, unspecified: Secondary | ICD-10-CM | POA: Diagnosis not present

## 2019-01-07 DIAGNOSIS — I2510 Atherosclerotic heart disease of native coronary artery without angina pectoris: Secondary | ICD-10-CM | POA: Diagnosis not present

## 2019-01-07 DIAGNOSIS — I252 Old myocardial infarction: Secondary | ICD-10-CM | POA: Diagnosis not present

## 2019-01-07 DIAGNOSIS — I639 Cerebral infarction, unspecified: Secondary | ICD-10-CM | POA: Diagnosis not present

## 2019-01-07 DIAGNOSIS — M1710 Unilateral primary osteoarthritis, unspecified knee: Secondary | ICD-10-CM | POA: Diagnosis not present

## 2019-01-07 DIAGNOSIS — S065X9A Traumatic subdural hemorrhage with loss of consciousness of unspecified duration, initial encounter: Secondary | ICD-10-CM | POA: Diagnosis not present

## 2019-01-07 DIAGNOSIS — I208 Other forms of angina pectoris: Secondary | ICD-10-CM | POA: Diagnosis not present

## 2019-01-07 DIAGNOSIS — Z87891 Personal history of nicotine dependence: Secondary | ICD-10-CM | POA: Diagnosis not present

## 2019-01-07 DIAGNOSIS — Z8781 Personal history of (healed) traumatic fracture: Secondary | ICD-10-CM | POA: Diagnosis not present

## 2019-01-29 ENCOUNTER — Ambulatory Visit: Payer: Medicare Other

## 2019-02-10 ENCOUNTER — Other Ambulatory Visit: Payer: Self-pay

## 2019-02-10 ENCOUNTER — Encounter: Payer: Self-pay | Admitting: Family Medicine

## 2019-02-10 ENCOUNTER — Ambulatory Visit (INDEPENDENT_AMBULATORY_CARE_PROVIDER_SITE_OTHER): Payer: Medicare Other | Admitting: Family Medicine

## 2019-02-10 VITALS — BP 107/77 | Ht 61.0 in | Wt 122.0 lb

## 2019-02-10 DIAGNOSIS — R7989 Other specified abnormal findings of blood chemistry: Secondary | ICD-10-CM

## 2019-02-10 DIAGNOSIS — E119 Type 2 diabetes mellitus without complications: Secondary | ICD-10-CM

## 2019-02-10 DIAGNOSIS — E1165 Type 2 diabetes mellitus with hyperglycemia: Secondary | ICD-10-CM | POA: Insufficient documentation

## 2019-02-10 MED ORDER — LEVOTHYROXINE SODIUM 25 MCG PO TABS: ORAL_TABLET | ORAL | 1 refills | Status: DC

## 2019-02-10 MED ORDER — METFORMIN HCL ER 500 MG PO TB24: 500.0000 mg | ORAL_TABLET | Freq: Every day | ORAL | 1 refills | Status: DC

## 2019-02-10 NOTE — Progress Notes (Addendum)
Date:  02/10/2019   Name:  Patricia Lawson   DOB:  [birth year]   MRN:  [id number]   Chief Complaint: Diabetes (126 this am) and Hypothyroidism  I connected withthis patient, Patricia Lawson, by telephoneat the patient's home.  I verified that I am speaking with the correct person using two identifiers. This visit was conducted via telephone due to the Covid-19 outbreak from my office at [facility] in [place], [place]. I discussed the limitations, risks, security and privacy concerns of performing an evaluation and management service by telephone. I also discussed with the patient that there may be a patient responsible charge related to this service. The patient expressed understanding and agreed to proceed.  Diabetes  She presents for her follow-up diabetic visit. She has type 2 diabetes mellitus. Her disease course has been stable. There are no hypoglycemic associated symptoms. Pertinent negatives for hypoglycemia include no dizziness, headaches, nervousness/anxiousness or tremors. Pertinent negatives for diabetes include no blurred vision, no chest pain, no fatigue, no foot paresthesias, no foot ulcerations, no polydipsia, no polyphagia, no polyuria, no visual change, no weakness and no weight loss. (Drop foot) There are no hypoglycemic complications. Symptoms are stable. There are no diabetic complications. Current diabetic treatment includes oral agent (monotherapy). She is compliant with treatment all of the time. Her weight is stable. She is following a generally healthy diet. Meal planning includes carbohydrate counting and avoidance of concentrated sweets. She participates in exercise intermittently. Her home blood glucose trend is fluctuating minimally. Her breakfast blood glucose is taken between 8-9 am. Her breakfast blood glucose range is generally 110-130 mg/dl. (100-130 mg %) An ACE inhibitor/angiotensin II receptor blocker is being taken. She does not see a podiatrist.Eye exam is  not current.  Thyroid Problem  Presents for follow-up visit. Symptoms include cold intolerance and hair loss. Patient reports no anxiety, constipation, depressed mood, diaphoresis, diarrhea, dry skin, fatigue, heat intolerance, hoarse voice, leg swelling, menstrual problem, nail problem, palpitations, tremors, visual change, weight gain or weight loss. The symptoms have been stable.    Review of Systems  Constitutional: Negative.  Negative for chills, diaphoresis, fatigue, fever, unexpected weight change, weight gain and weight loss.  HENT: Negative for congestion, ear discharge, ear pain, hoarse voice, rhinorrhea, sinus pressure, sneezing and sore throat.   Eyes: Negative for blurred vision, photophobia, pain, discharge, redness and itching.  Respiratory: Negative for cough, shortness of breath, wheezing and stridor.   Cardiovascular: Negative for chest pain and palpitations.  Gastrointestinal: Negative for abdominal pain, blood in stool, constipation, diarrhea, nausea and vomiting.  Endocrine: Positive for cold intolerance. Negative for heat intolerance, polydipsia, polyphagia and polyuria.  Genitourinary: Negative for dysuria, flank pain, frequency, hematuria, menstrual problem, pelvic pain, urgency, vaginal bleeding and vaginal discharge.  Musculoskeletal: Negative for arthralgias, back pain and myalgias.  Skin: Negative for rash.  Allergic/Immunologic: Negative for environmental allergies and food allergies.  Neurological: Negative for dizziness, tremors, weakness, light-headedness, numbness and headaches.  Hematological: Negative for adenopathy. Does not bruise/bleed easily.  Psychiatric/Behavioral: Negative for dysphoric mood. The patient is not nervous/anxious.     Patient Active Problem List   Diagnosis Date Noted   Fall 06/21/2018   RUE weakness 06/20/2018   Facial laceration 06/20/2018   HTN (hypertension) 06/20/2018   CAD (coronary artery disease) 06/20/2018   UTI  (urinary tract infection) 06/20/2018   Patella fracture 04/05/2018   Appendicitis, acute 03/04/2015    No Known Allergies  Past Surgical History:  Procedure Laterality Date  CARDIAC CATHETERIZATION  2010   5 stents placed   CATARACT EXTRACTION W/ INTRAOCULAR LENS  IMPLANT, BILATERAL     HARDWARE REMOVAL Right 12/11/2018   Procedure: SCREW REMOVAL FROM PATELLA;  Surgeon: Corky Mull, MD;  Location: [place];  Service: Orthopedics;  Laterality: Right;  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   LAPAROSCOPIC APPENDECTOMY N/A 03/04/2015   Procedure: APPENDECTOMY LAPAROSCOPIC;  Surgeon: Sherri Rad, MD;  Location: [facility];  Service: General;  Laterality: N/A;   ORIF PATELLA Right 04/06/2018   Procedure: OPEN REDUCTION INTERNAL (ORIF) FIXATION PATELLA;  Surgeon: Leim Fabry, MD;  Location: [facility];  Service: Orthopedics;  Laterality: Right;   STENT PLACE LEFT URETER ([place] HX)     cardiac   TENDON EXPLORATION Right 12/11/2018   Procedure: EXPLORATION OF PERONEAL NERVE LATERAL KNEE;  Surgeon: Corky Mull, MD;  Location: [place];  Service: Orthopedics;  Laterality: Right;  Diabetic - oral meds   THYROIDECTOMY Bilateral     Social History   Tobacco Use   Smoking status: Former Smoker    Types: Cigarettes    Last attempt to quit: 03/03/2009    Years since quitting: 9.9   Smokeless tobacco: Never Used  Substance Use Topics   Alcohol use: No   Drug use: Never     Medication list has been reviewed and updated.  Current Meds  Medication Sig   acetaminophen (TYLENOL) 500 MG tablet Take 2 tablets (1,000 mg total) by mouth every 8 (eight) hours.   aspirin EC 81 MG EC tablet Take 1 tablet (81 mg total) by mouth daily.   atorvastatin (LIPITOR) 80 MG tablet Take 80 mg by mouth daily. Dr Clayborn Bigness   irbesartan (AVAPRO) 75 MG tablet Take 75 mg by mouth daily. Dr Clayborn Bigness   levothyroxine (SYNTHROID, LEVOTHROID) 25 MCG tablet TAKE 1 TABLET BY MOUTH ONCE DAILY  BEFORE BREAKFAST   metFORMIN (GLUCOPHAGE-XR) 500 MG 24 hr tablet Take 1 tablet (500 mg total) by mouth daily with breakfast.   metoprolol succinate (TOPROL-XL) 100 MG 24 hr tablet Take 100 mg by mouth daily. Dr Clayborn Bigness    [facility] 2/9 Scores 02/10/2019 08/15/2018 07/02/2018  PHQ - 2 Score 0 0 4  PHQ- 9 Score 0 0 13    BP Readings from Last 3 Encounters:  02/10/19 107/77  12/11/18 127/66  10/03/18 133/70    Physical Exam Vitals signs and nursing note reviewed.  Constitutional:      Appearance: She is normal weight.     Wt Readings from Last 3 Encounters:  02/10/19 122 lb (55.3 kg)  12/11/18 110 lb (49.9 kg)  10/03/18 122 lb (55.3 kg)    BP 107/77    Ht 5\' 1"  (1.549 m)    Wt 122 lb (55.3 kg)    BMI 23.05 kg/m   Assessment and Plan: 1. Type 2 diabetes mellitus without complication, without long-term current use of insulin ([place]) .  Controlled.  Patient's blood sugars RND 100-1 30 range fasting.  We will continue at present dosing of metformin which is the 500 mg 24-hour tablet that she takes once a day.  We will recheck her in 4 months at which time we will do an A1c. - metFORMIN (GLUCOPHAGE-XR) 500 MG 24 hr tablet; Take 1 tablet (500 mg total) by mouth daily with breakfast.  Dispense: 90 tablet; Refill: 1  2. Elevated TSH The patient's thyroid-stimulating hormone was elevated on last visit.  Patient is doing well on 25 mcg will continue at  25 mcg levothyroxine and will recheck in 4 months when patient returns for diabetic check. - levothyroxine (SYNTHROID, LEVOTHROID) 25 MCG tablet; One a day  Dispense: 90 tablet; Refill: 1 I spent 10 minutes with this patient, More than 50% of that time was spent in face to face education, counseling and care coordination.

## 2019-03-31 ENCOUNTER — Ambulatory Visit (INDEPENDENT_AMBULATORY_CARE_PROVIDER_SITE_OTHER): Payer: Medicare Other

## 2019-03-31 VITALS — BP 112/65 | HR 67 | Ht 61.0 in | Wt 122.0 lb

## 2019-03-31 DIAGNOSIS — Z1211 Encounter for screening for malignant neoplasm of colon: Secondary | ICD-10-CM

## 2019-03-31 DIAGNOSIS — Z0000 Encounter for general adult medical examination without abnormal findings: Secondary | ICD-10-CM

## 2019-03-31 DIAGNOSIS — E785 Hyperlipidemia, unspecified: Secondary | ICD-10-CM | POA: Insufficient documentation

## 2019-03-31 DIAGNOSIS — Z1231 Encounter for screening mammogram for malignant neoplasm of breast: Secondary | ICD-10-CM

## 2019-03-31 DIAGNOSIS — Z780 Asymptomatic menopausal state: Secondary | ICD-10-CM

## 2019-03-31 DIAGNOSIS — I252 Old myocardial infarction: Secondary | ICD-10-CM

## 2019-03-31 DIAGNOSIS — I219 Acute myocardial infarction, unspecified: Secondary | ICD-10-CM | POA: Insufficient documentation

## 2019-03-31 HISTORY — DX: Old myocardial infarction: I25.2

## 2019-03-31 NOTE — Progress Notes (Signed)
Subjective:   Patricia Lawson is a 73 y.o. female who presents for an Initial Medicare Annual Wellness Visit.  Virtual Visit via Telephone Note  I connected with [name] on 03/31/19 at 10:40 AM EDT by telephone and verified that I am speaking with the correct person using two identifiers.  Medicare Annual Wellness visit completely telephonically due to Covid-19 pandemic.  Location: Patient: home  Provider: office   I discussed the limitations, risks, security and privacy concerns of performing an evaluation and management service by telephone and the availability of in person appointments. The patient expressed understanding and agreed to proceed.  Some vital signs may be absent or patient reported.   [name], LPN     Review of Systems      Cardiac Risk Factors include: advanced age (>83men, >6 women);diabetes mellitus;dyslipidemia;hypertension     Objective:    Today's Vitals   03/31/19 1056  BP: 112/65  Pulse: 67  Weight: 122 lb (55.3 kg)  Height: 5\' 1"  (1.549 m)   Body mass index is 23.05 kg/m.  Advanced Directives 03/31/2019 12/11/2018 06/20/2018 06/20/2018 04/05/2018 03/04/2015  Does Patient Have a Medical Advance Directive? No No No No No No  Would patient like information on creating a medical advance directive? Yes (MAU/Ambulatory/Procedural Areas - Information given) No - Patient declined No - Patient declined No - Patient declined No - Patient declined -    Current Medications (verified) Outpatient Encounter Medications as of 03/31/2019  Medication Sig  . acetaminophen (TYLENOL) 500 MG tablet Take 2 tablets (1,000 mg total) by mouth every 8 (eight) hours.  Marland Kitchen aspirin EC 81 MG EC tablet Take 1 tablet (81 mg total) by mouth daily.  Marland Kitchen atorvastatin (LIPITOR) 80 MG tablet Take 80 mg by mouth daily. Dr Clayborn Bigness  . irbesartan (AVAPRO) 75 MG tablet Take 75 mg by mouth daily. Dr Clayborn Bigness  . levothyroxine (SYNTHROID, LEVOTHROID) 25 MCG tablet One a day  .  metFORMIN (GLUCOPHAGE-XR) 500 MG 24 hr tablet Take 1 tablet (500 mg total) by mouth daily with breakfast.  . metoprolol succinate (TOPROL-XL) 100 MG 24 hr tablet Take 100 mg by mouth daily. Dr Clayborn Bigness  . Multiple Vitamin (MULTIVITAMIN) tablet Take 1 tablet by mouth daily.   No facility-administered encounter medications on file as of 03/31/2019.     Allergies (verified) Patient has no known allergies.   History: Past Medical History:  Diagnosis Date  . CHF (congestive heart failure) ([place]) 2010  . Coronary artery disease   . Diabetes mellitus without complication ([place])    type 2  . Hyperlipidemia   . Hypertension   . Myocardial infarction ([place]) 2010   5 stents placed  . Stroke [facility]) 05/2018   no deficits   Past Surgical History:  Procedure Laterality Date  . CARDIAC CATHETERIZATION  2010   5 stents placed  . CATARACT EXTRACTION W/ INTRAOCULAR LENS  IMPLANT, BILATERAL    . HARDWARE REMOVAL Right 12/11/2018   Procedure: SCREW REMOVAL FROM PATELLA;  Surgeon: Corky Mull, MD;  Location: [place];  Service: Orthopedics;  Laterality: Right;  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  . LAPAROSCOPIC APPENDECTOMY N/A 03/04/2015   Procedure: APPENDECTOMY LAPAROSCOPIC;  Surgeon: Sherri Rad, MD;  Location: [facility];  Service: General;  Laterality: N/A;  . ORIF PATELLA Right 04/06/2018   Procedure: OPEN REDUCTION INTERNAL (ORIF) FIXATION PATELLA;  Surgeon: Leim Fabry, MD;  Location: [facility];  Service: Orthopedics;  Laterality: Right;  . STENT PLACE LEFT URETER (  [place] HX)     cardiac  . TENDON EXPLORATION Right 12/11/2018   Procedure: EXPLORATION OF PERONEAL NERVE LATERAL KNEE;  Surgeon: Corky Mull, MD;  Location: [place];  Service: Orthopedics;  Laterality: Right;  Diabetic - oral meds  . THYROIDECTOMY Bilateral    Family History  Problem Relation Age of Onset  . Heart block Mother   . Heart disease Mother   . Hypertension Mother   . Heart block Father   . Heart  disease Father   . Hypertension Father    Social History   Socioeconomic History  . Marital status: Divorced    Spouse name: Not on file  . Number of children: 2  . Years of education: Not on file  . Highest education level: Some college, no degree  Occupational History  . Occupation: retired  Scientific laboratory technician  . Financial resource strain: Not very hard  . Food insecurity:    Worry: Never true    Inability: Never true  . Transportation needs:    Medical: No    Non-medical: No  Tobacco Use  . Smoking status: Former Smoker    Types: Cigarettes    Last attempt to quit: 03/03/2009    Years since quitting: 10.0  . Smokeless tobacco: Never Used  Substance and Sexual Activity  . Alcohol use: No  . Drug use: Never  . Sexual activity: Not Currently  Lifestyle  . Physical activity:    Days per week: 3 days    Minutes per session: 30 min  . Stress: Only a little  Relationships  . Social connections:    Talks on phone: More than three times a week    Gets together: Three times a week    Attends religious service: More than 4 times per year    Active member of club or organization: No    Attends meetings of clubs or organizations: Never    Relationship status: Divorced  Other Topics Concern  . Not on file  Social History Narrative  . Not on file    Tobacco Counseling Counseling given: Not Answered   Clinical Intake:  Pre-visit preparation completed: Yes  Pain : No/denies pain     BMI - recorded: 23.05 Nutritional Status: BMI of 19-24  Normal Nutritional Risks: None Diabetes: Yes CBG done?: No Did pt. bring in CBG monitor from home?: No  How often do you need to have someone help you when you read instructions, pamphlets, or other written materials from your doctor or pharmacy?: 1 - Never  Interpreter Needed?: No  Information entered by :: [name] LPN   Activities of Daily Living In your present state of health, do you have any difficulty performing the  following activities: 03/31/2019 12/11/2018  Hearing? N N  Comment declines hearing aids -  Vision? N N  Comment reading glasses -  Difficulty concentrating or making decisions? N N  Walking or climbing stairs? N N  Dressing or bathing? N N  Doing errands, shopping? N -  Preparing Food and eating ? N -  Using the Toilet? N -  In the past six months, have you accidently leaked urine? N -  Do you have problems with loss of bowel control? N -  Managing your Medications? N -  Managing your Finances? N -  Housekeeping or managing your Housekeeping? N -  Some recent data might be hidden     Immunizations and Health Maintenance Immunization History  Administered Date(s) Administered  .  Pneumococcal Conjugate-13 08/15/2018  . Tdap 08/17/2015, 06/20/2018   Health Maintenance Due  Topic Date Due  . FOOT EXAM  10[birth year]12/1956  . OPHTHALMOLOGY EXAM  10/12/1956  . MAMMOGRAM  10/12/1996    Patient Care Team: Juline Patch, MD as PCP - General (Family Medicine)  Indicate any recent Medical Services you may have received from other than [facility] providers in the past year (date may be approximate).     Assessment:   This is a routine wellness examination for [place].  Hearing/Vision screen Hearing Screening Comments: Pt denies hearing difficulty  Vision Screening Comments: Annual vision screenings done by Dr. Wyatt Portela at [place] issues and exercise activities discussed: Current Exercise Habits: Home exercise routine, Type of exercise: walking, Time (Minutes): 30, Frequency (Times/Week): 3, Weekly Exercise (Minutes/Week): 90, Intensity: Mild, Exercise limited by: orthopedic condition(s)  Goals    . Increase physical activity     Pt would like to increase physical activity and walking more.       Depression Screen PHQ 2/9 Scores 03/31/2019 02/10/2019 08/15/2018 07/02/2018  PHQ - 2 Score 2 0 0 4  PHQ- 9 Score 2 0 0 13    Fall Risk Fall Risk  03/31/2019 07/02/2018  Falls in the past  year? 0 Yes  Comment - 06/20/2018  Number falls in past yr: 0 2 or more  Injury with Fall? 0 Yes  Follow up Falls prevention discussed Falls evaluation completed   [place]:  Any stairs in or around the home? Yes  If so, do they handrails? Yes   Home free of loose throw rugs in walkways, pet beds, electrical cords, etc? Yes  Adequate lighting in your home to reduce risk of falls? Yes   ASSISTIVE DEVICES UTILIZED TO PREVENT FALLS:  Life alert? No  Use of a cane, walker or w/c? Yes  Grab bars in the bathroom? No  Shower chair or bench in shower? Yes  Elevated toilet seat or a handicapped toilet? Yes   DME ORDERS:  DME order needed?  No   TIMED UP AND GO:  Was the test performed? No . Telephonic visit  Education: Fall risk prevention has been discussed.  Intervention(s) required? No    Cognitive Function:     6CIT Screen 03/31/2019  What Year? 0 points  What month? 0 points  What time? 0 points  Count back from 20 0 points  Months in reverse 0 points  Repeat phrase 0 points  Total Score 0    Screening Tests Health Maintenance  Topic Date Due  . FOOT EXAM  10[birth year]12/1956  . OPHTHALMOLOGY EXAM  10/12/1956  . MAMMOGRAM  10/12/1996  . DEXA SCAN  02/10/2020 (Originally 10/13/2011)  . COLONOSCOPY  02/10/2020 (Originally 10/12/1996)  . Hepatitis C Screening  02/10/2020 (Originally 26-May-1946)  . HEMOGLOBIN A1C  04/01/2019  . INFLUENZA VACCINE  05/31/2019  . PNA vac Low Risk Adult (2 of 2 - PPSV23) 08/16/2019  . TETANUS/TDAP  06/20/2028    Qualifies for Shingles Vaccine? Yes . Due for Shingrix. Education has been provided regarding the importance of this vaccine. Pt has been advised to call insurance company to determine out of pocket expense. Advised may also receive vaccine at local pharmacy or Health Dept. Verbalized acceptance and understanding.  Tdap: Up to date  Flu Vaccine: Due for Flu vaccine. Does the patient want to receive  this vaccine today?  No . Education has been provided regarding the importance of this vaccine  but still declined. Advised may receive this vaccine at local pharmacy or Health Dept. Aware to provide a copy of the vaccination record if obtained from local pharmacy or Health Dept. Verbalized acceptance and understanding.  Pneumococcal Vaccine: Due for second dose 07/2019  Cancer Screenings:  Colorectal Screening: DUE. Referral to GI placed today. Pt aware the office will call re: appt.  Mammogram: Due. Repeat every year. Ordered today. Pt provided with contact information and advised to call to schedule appt.   Bone Density: Due. Ordered today. Pt provided with contact information and advised to call to schedule appt.   Lung Cancer Screening: (Low Dose CT Chest recommended if Age 51-80 years, 30 pack-year currently smoking OR have quit w/in 15years.) does not qualify.    Additional Screening:  Hepatitis C Screening: postponed  Vision Screening: Recommended annual ophthalmology exams for early detection of glaucoma and other disorders of the eye. Is the patient up to date with their annual eye exam?  Yes  Who is the provider or what is the name of the office in which the pt attends annual eye exams? Dr. Wyatt Portela  Dental Screening: Recommended annual dental exams for proper oral hygiene  Community Resource Referral:  CRR required this visit?  No      Plan:     I have personally reviewed and addressed the Medicare Annual Wellness questionnaire and have noted the following in the patient's chart:  A. Medical and social history B. Use of alcohol, tobacco or illicit drugs  C. Current medications and supplements D. Functional ability and status E.  Nutritional status F.  Physical activity G. Advance directives H. List of other physicians I.  Hospitalizations, surgeries, and ER visits in previous 12 months J.  [place] such as hearing and vision if needed, cognitive and  depression L. Referrals and appointments   In addition, I have reviewed and discussed with patient certain preventive protocols, quality metrics, and best practice recommendations. A written personalized care plan for preventive services as well as general preventive health recommendations were provided to patient.   Signed,  [name], LPN Nurse Health Advisor   Nurse Notes: pt doing well and appreciative of visit today

## 2019-03-31 NOTE — Patient Instructions (Signed)
[name] , Thank you for taking time to come for your Medicare Wellness Visit. I appreciate your ongoing commitment to your health goals. Please review the following plan we discussed and let me know if I can assist you in the future.   Screening recommendations/referrals: Colonoscopy: Referral to GI sent today. They will contact you regarding an appointment.  Mammogram: Please call [phone] to schedule your mammogram and bone density screening.   Recommended yearly ophthalmology/optometry visit for glaucoma screening and checkup Recommended yearly dental visit for hygiene and checkup  Vaccinations: Influenza vaccine: postponed Pneumococcal vaccine: done 08/15/18. Due for second dose 07/2019 Tdap vaccine: done 06/20/18 Shingles vaccine: Shingrix discussed. Please contact your pharmacy for coverage information.   Advanced directives: Advance directive discussed with you today. I have mailed a copy for you to complete at home and have notarized. Once this is complete please bring a copy in to our office so we can scan it into your chart.  Conditions/risks identified: Recommend increasing physical activity to 150 minutes per week.   Next appointment: Please follow up in one year for your Medicare Annual Wellness visit.     Preventive Care 30 Years and Older, Female Preventive care refers to lifestyle choices and visits with your health care provider that can promote health and wellness. What does preventive care include?  A yearly physical exam. This is also called an annual well check.  Dental exams once or twice a year.  Routine eye exams. Ask your health care provider how often you should have your eyes checked.  Personal lifestyle choices, including:  Daily care of your teeth and gums.  Regular physical activity.  Eating a healthy diet.  Avoiding tobacco and drug use.  Limiting alcohol use.  Practicing safe sex.  Taking low-dose aspirin every day.  Taking  vitamin and mineral supplements as recommended by your health care provider. What happens during an annual well check? The services and screenings done by your health care provider during your annual well check will depend on your age, overall health, lifestyle risk factors, and family history of disease. Counseling  Your health care provider may ask you questions about your:  Alcohol use.  Tobacco use.  Drug use.  Emotional well-being.  Home and relationship well-being.  Sexual activity.  Eating habits.  History of falls.  Memory and ability to understand (cognition).  Work and work Statistician.  Reproductive health. Screening  You may have the following tests or measurements:  Height, weight, and BMI.  Blood pressure.  Lipid and cholesterol levels. These may be checked every 5 years, or more frequently if you are over 27 years old.  Skin check.  Lung cancer screening. You may have this screening every year starting at age 34 if you have a 30-pack-year history of smoking and currently smoke or have quit within the past 15 years.  Fecal occult blood test (FOBT) of the stool. You may have this test every year starting at age 19.  Flexible sigmoidoscopy or colonoscopy. You may have a sigmoidoscopy every 5 years or a colonoscopy every 10 years starting at age 64.  Hepatitis C blood test.  Hepatitis B blood test.  Sexually transmitted disease (STD) testing.  Diabetes screening. This is done by checking your blood sugar (glucose) after you have not eaten for a while (fasting). You may have this done every 1-3 years.  Bone density scan. This is done to screen for osteoporosis. You may have this done starting at age 5.  Mammogram.  This may be done every 1-2 years. Talk to your health care provider about how often you should have regular mammograms. Talk with your health care provider about your test results, treatment options, and if necessary, the need for more  tests. Vaccines  Your health care provider may recommend certain vaccines, such as:  Influenza vaccine. This is recommended every year.  Tetanus, diphtheria, and acellular pertussis (Tdap, Td) vaccine. You may need a Td booster every 10 years.  Zoster vaccine. You may need this after age 9.  Pneumococcal 13-valent conjugate (PCV13) vaccine. One dose is recommended after age 89.  Pneumococcal polysaccharide (PPSV23) vaccine. One dose is recommended after age 60. Talk to your health care provider about which screenings and vaccines you need and how often you need them. This information is not intended to replace advice given to you by your health care provider. Make sure you discuss any questions you have with your health care provider. Document Released: 11/12/2015 Document Revised: 07/05/2016 Document Reviewed: 08/17/2015 Elsevier Interactive Patient Education  2017 [place] Prevention in the Home Falls can cause injuries. They can happen to people of all ages. There are many things you can do to make your home safe and to help prevent falls. What can I do on the outside of my home?  Regularly fix the edges of walkways and driveways and fix any cracks.  Remove anything that might make you trip as you walk through a door, such as a raised step or threshold.  Trim any bushes or trees on the path to your home.  Use bright outdoor lighting.  Clear any walking paths of anything that might make someone trip, such as rocks or tools.  Regularly check to see if handrails are loose or broken. Make sure that both sides of any steps have handrails.  Any raised decks and porches should have guardrails on the edges.  Have any leaves, snow, or ice cleared regularly.  Use sand or salt on walking paths during winter.  Clean up any spills in your garage right away. This includes oil or grease spills. What can I do in the bathroom?  Use night lights.  Install grab bars by the  toilet and in the tub and shower. Do not use towel bars as grab bars.  Use non-skid mats or decals in the tub or shower.  If you need to sit down in the shower, use a plastic, non-slip stool.  Keep the floor dry. Clean up any water that spills on the floor as soon as it happens.  Remove soap buildup in the tub or shower regularly.  Attach bath mats securely with double-sided non-slip rug tape.  Do not have throw rugs and other things on the floor that can make you trip. What can I do in the bedroom?  Use night lights.  Make sure that you have a light by your bed that is easy to reach.  Do not use any sheets or blankets that are too big for your bed. They should not hang down onto the floor.  Have a firm chair that has side arms. You can use this for support while you get dressed.  Do not have throw rugs and other things on the floor that can make you trip. What can I do in the kitchen?  Clean up any spills right away.  Avoid walking on wet floors.  Keep items that you use a lot in easy-to-reach places.  If you need to reach something  above you, use a strong step stool that has a grab bar.  Keep electrical cords out of the way.  Do not use floor polish or wax that makes floors slippery. If you must use wax, use non-skid floor wax.  Do not have throw rugs and other things on the floor that can make you trip. What can I do with my stairs?  Do not leave any items on the stairs.  Make sure that there are handrails on both sides of the stairs and use them. Fix handrails that are broken or loose. Make sure that handrails are as long as the stairways.  Check any carpeting to make sure that it is firmly attached to the stairs. Fix any carpet that is loose or worn.  Avoid having throw rugs at the top or bottom of the stairs. If you do have throw rugs, attach them to the floor with carpet tape.  Make sure that you have a light switch at the top of the stairs and the bottom of  the stairs. If you do not have them, ask someone to add them for you. What else can I do to help prevent falls?  Wear shoes that:  Do not have high heels.  Have rubber bottoms.  Are comfortable and fit you well.  Are closed at the toe. Do not wear sandals.  If you use a stepladder:  Make sure that it is fully opened. Do not climb a closed stepladder.  Make sure that both sides of the stepladder are locked into place.  Ask someone to hold it for you, if possible.  Clearly mark and make sure that you can see:  Any grab bars or handrails.  First and last steps.  Where the edge of each step is.  Use tools that help you move around (mobility aids) if they are needed. These include:  Canes.  Walkers.  Scooters.  Crutches.  Turn on the lights when you go into a dark area. Replace any light bulbs as soon as they burn out.  Set up your furniture so you have a clear path. Avoid moving your furniture around.  If any of your floors are uneven, fix them.  If there are any pets around you, be aware of where they are.  Review your medicines with your doctor. Some medicines can make you feel dizzy. This can increase your chance of falling. Ask your doctor what other things that you can do to help prevent falls. This information is not intended to replace advice given to you by your health care provider. Make sure you discuss any questions you have with your health care provider. Document Released: 08/12/2009 Document Revised: 03/23/2016 Document Reviewed: 11/20/2014 Elsevier Interactive Patient Education  2017 [employer].

## 2019-04-17 ENCOUNTER — Other Ambulatory Visit: Payer: Self-pay

## 2019-04-17 DIAGNOSIS — E119 Type 2 diabetes mellitus without complications: Secondary | ICD-10-CM

## 2019-04-17 MED ORDER — METFORMIN HCL ER 500 MG PO TB24: 500.0000 mg | ORAL_TABLET | Freq: Every day | ORAL | 1 refills | Status: DC

## 2019-06-12 ENCOUNTER — Ambulatory Visit (INDEPENDENT_AMBULATORY_CARE_PROVIDER_SITE_OTHER): Payer: Medicare Other | Admitting: Family Medicine

## 2019-06-12 ENCOUNTER — Other Ambulatory Visit: Payer: Self-pay

## 2019-06-12 ENCOUNTER — Encounter: Payer: Self-pay | Admitting: Family Medicine

## 2019-06-12 VITALS — BP 138/70 | HR 64 | Ht 61.0 in | Wt 132.0 lb

## 2019-06-12 DIAGNOSIS — R7989 Other specified abnormal findings of blood chemistry: Secondary | ICD-10-CM

## 2019-06-12 DIAGNOSIS — E039 Hypothyroidism, unspecified: Secondary | ICD-10-CM

## 2019-06-12 DIAGNOSIS — R69 Illness, unspecified: Secondary | ICD-10-CM

## 2019-06-12 DIAGNOSIS — E119 Type 2 diabetes mellitus without complications: Secondary | ICD-10-CM | POA: Diagnosis not present

## 2019-06-12 MED ORDER — METFORMIN HCL ER 500 MG PO TB24: 500.0000 mg | ORAL_TABLET | Freq: Every day | ORAL | 1 refills | Status: DC

## 2019-06-12 MED ORDER — ATORVASTATIN CALCIUM 80 MG PO TABS: 80.0000 mg | ORAL_TABLET | Freq: Every day | ORAL | 1 refills | Status: DC

## 2019-06-12 MED ORDER — LEVOTHYROXINE SODIUM 25 MCG PO TABS: ORAL_TABLET | ORAL | 1 refills | Status: DC

## 2019-06-12 NOTE — Patient Instructions (Signed)
Why follow it? Research shows. . Those who follow the Mediterranean diet have a reduced risk of heart disease  . The diet is associated with a reduced incidence of Parkinson's and Alzheimer's diseases . People following the diet may have longer life expectancies and lower rates of chronic diseases  . The Dietary Guidelines for Americans recommends the Mediterranean diet as an eating plan to promote health and prevent disease  What Is the Mediterranean Diet?  . Healthy eating plan based on typical foods and recipes of Mediterranean-style cooking . The diet is primarily a plant based diet; these foods should make up a majority of meals   Starches - Plant based foods should make up a majority of meals - They are an important sources of vitamins, minerals, energy, antioxidants, and fiber - Choose whole grains, foods high in fiber and minimally processed items  - Typical grain sources include wheat, oats, barley, corn, brown rice, bulgar, farro, millet, polenta, couscous  - Various types of beans include chickpeas, lentils, fava beans, black beans, white beans   Fruits  Veggies - Large quantities of antioxidant rich fruits & veggies; 6 or more servings  - Vegetables can be eaten raw or lightly drizzled with oil and cooked  - Vegetables common to the traditional Mediterranean Diet include: artichokes, arugula, beets, broccoli, brussel sprouts, cabbage, carrots, celery, collard greens, cucumbers, eggplant, kale, leeks, lemons, lettuce, mushrooms, okra, onions, peas, peppers, potatoes, pumpkin, radishes, rutabaga, shallots, spinach, sweet potatoes, turnips, zucchini - Fruits common to the Mediterranean Diet include: apples, apricots, avocados, cherries, clementines, dates, figs, grapefruits, grapes, melons, nectarines, oranges, peaches, pears, pomegranates, strawberries, tangerines  Fats - Replace butter and margarine with healthy oils, such as olive oil, canola oil, and tahini  - Limit nuts to no  more than a handful a day  - Nuts include walnuts, almonds, pecans, pistachios, pine nuts  - Limit or avoid candied, honey roasted or heavily salted nuts - Olives are central to the Mediterranean diet - can be eaten whole or used in a variety of dishes   Meats Protein - Limiting red meat: no more than a few times a month - When eating red meat: choose lean cuts and keep the portion to the size of deck of cards - Eggs: approx. 0 to 4 times a week  - Fish and lean poultry: at least 2 a week  - Healthy protein sources include, chicken, turkey, lean beef, lamb - Increase intake of seafood such as tuna, salmon, trout, mackerel, shrimp, scallops - Avoid or limit high fat processed meats such as sausage and bacon  Dairy - Include moderate amounts of low fat dairy products  - Focus on healthy dairy such as fat free yogurt, skim milk, low or reduced fat cheese - Limit dairy products higher in fat such as whole or 2% milk, cheese, ice cream  Alcohol - Moderate amounts of red wine is ok  - No more than 5 oz daily for women (all ages) and men older than age 65  - No more than 10 oz of wine daily for men younger than 65  Other - Limit sweets and other desserts  - Use herbs and spices instead of salt to flavor foods  - Herbs and spices common to the traditional Mediterranean Diet include: basil, bay leaves, chives, cloves, cumin, fennel, garlic, lavender, marjoram, mint, oregano, parsley, pepper, rosemary, sage, savory, sumac, tarragon, thyme   It's not just a diet, it's a lifestyle:  . The Mediterranean diet includes   lifestyle factors typical of those in the region  . Foods, drinks and meals are best eaten with others and savored . Daily physical activity is important for overall good health . This could be strenuous exercise like running and aerobics . This could also be more leisurely activities such as walking, housework, yard-work, or taking the stairs . Moderation is the key; a balanced and  healthy diet accommodates most foods and drinks . Consider portion sizes and frequency of consumption of certain foods   Meal Ideas & Options:  . Breakfast:  o Whole wheat toast or whole wheat English muffins with peanut butter & hard boiled egg o Steel cut oats topped with apples & cinnamon and skim milk  o Fresh fruit: banana, strawberries, melon, berries, peaches  o Smoothies: strawberries, bananas, greek yogurt, peanut butter o Low fat greek yogurt with blueberries and granola  o Egg white omelet with spinach and mushrooms o Breakfast couscous: whole wheat couscous, apricots, skim milk, cranberries  . Sandwiches:  o Hummus and grilled vegetables (peppers, zucchini, squash) on whole wheat bread   o Grilled chicken on whole wheat pita with lettuce, tomatoes, cucumbers or tzatziki  o Tuna salad on whole wheat bread: tuna salad made with greek yogurt, olives, red peppers, capers, green onions o Garlic rosemary lamb pita: lamb sauted with garlic, rosemary, salt & pepper; add lettuce, cucumber, greek yogurt to pita - flavor with lemon juice and black pepper  . Seafood:  o Mediterranean grilled salmon, seasoned with garlic, basil, parsley, lemon juice and black pepper o Shrimp, lemon, and spinach whole-grain pasta salad made with low fat greek yogurt  o Seared scallops with lemon orzo  o Seared tuna steaks seasoned salt, pepper, coriander topped with tomato mixture of olives, tomatoes, olive oil, minced garlic, parsley, green onions and cappers  . Meats:  o Herbed greek chicken salad with kalamata olives, cucumber, feta  o Red bell peppers stuffed with spinach, bulgur, lean ground beef (or lentils) & topped with feta   o Kebabs: skewers of chicken, tomatoes, onions, zucchini, squash  o [place] burgers: made with red onions, mint, dill, lemon juice, feta cheese topped with roasted red peppers . Vegetarian o Cucumber salad: cucumbers, artichoke hearts, celery, red onion, feta cheese, tossed in  olive oil & lemon juice  o Hummus and whole grain pita points with a greek salad (lettuce, tomato, feta, olives, cucumbers, red onion) o Lentil soup with celery, carrots made with vegetable broth, garlic, salt and pepper  o Tabouli salad: parsley, bulgur, mint, scallions, cucumbers, tomato, radishes, lemon juice, olive oil, salt and pepper.     Mediterranean Diet A Mediterranean diet refers to food and lifestyle choices that are based on the traditions of countries located on the [employer]. This way of eating has been shown to help prevent certain conditions and improve outcomes for people who have chronic diseases, like kidney disease and heart disease. What are tips for following this plan? Lifestyle  Cook and eat meals together with your family, when possible.  Drink enough fluid to keep your urine clear or pale yellow.  Be physically active every day. This includes: ? Aerobic exercise like running or swimming. ? Leisure activities like gardening, walking, or housework.  Get 7-8 hours of sleep each night.  If recommended by your health care provider, drink red wine in moderation. This means 1 glass a day for nonpregnant women and 2 glasses a day for men. A glass of wine equals 5 oz (150  mL). Reading food labels   Check the serving size of packaged foods. For foods such as rice and pasta, the serving size refers to the amount of cooked product, not dry.  Check the total fat in packaged foods. Avoid foods that have saturated fat or trans fats.  Check the ingredients list for added sugars, such as corn syrup. Shopping  At the grocery store, buy most of your food from the areas near the walls of the store. This includes: ? Fresh fruits and vegetables (produce). ? Grains, beans, nuts, and seeds. Some of these may be available in unpackaged forms or large amounts (in bulk). ? Fresh seafood. ? Poultry and eggs. ? Low-fat dairy products.  Buy whole ingredients instead of  prepackaged foods.  Buy fresh fruits and vegetables in-season from local farmers markets.  Buy frozen fruits and vegetables in resealable bags.  If you do not have access to quality fresh seafood, buy precooked frozen shrimp or canned fish, such as tuna, salmon, or sardines.  Buy small amounts of raw or cooked vegetables, salads, or olives from the deli or salad bar at your store.  Stock your pantry so you always have certain foods on hand, such as olive oil, canned tuna, canned tomatoes, rice, pasta, and beans. Cooking  Cook foods with extra-virgin olive oil instead of using butter or other vegetable oils.  Have meat as a side dish, and have vegetables or grains as your main dish. This means having meat in small portions or adding small amounts of meat to foods like pasta or stew.  Use beans or vegetables instead of meat in common dishes like chili or lasagna.  Experiment with different cooking methods. Try roasting or broiling vegetables instead of steaming or sauteing them.  Add frozen vegetables to soups, stews, pasta, or rice.  Add nuts or seeds for added healthy fat at each meal. You can add these to yogurt, salads, or vegetable dishes.  Marinate fish or vegetables using olive oil, lemon juice, garlic, and fresh herbs. Meal planning   Plan to eat 1 vegetarian meal one day each week. Try to work up to 2 vegetarian meals, if possible.  Eat seafood 2 or more times a week.  Have healthy snacks readily available, such as: ? Vegetable sticks with hummus. ? [place] yogurt. ? Fruit and nut trail mix.  Eat balanced meals throughout the week. This includes: ? Fruit: 2-3 servings a day ? Vegetables: 4-5 servings a day ? Low-fat dairy: 2 servings a day ? Fish, poultry, or lean meat: 1 serving a day ? Beans and legumes: 2 or more servings a week ? Nuts and seeds: 1-2 servings a day ? Whole grains: 6-8 servings a day ? Extra-virgin olive oil: 3-4 servings a day  Limit red meat  and sweets to only a few servings a month What are my food choices?  Mediterranean diet ? Recommended  Grains: Whole-grain pasta. Brown rice. Bulgar wheat. Polenta. Couscous. Whole-wheat bread. [place].  Vegetables: Artichokes. Beets. Broccoli. Cabbage. Carrots. Eggplant. Green beans. Chard. Kale. Spinach. Onions. Leeks. Peas. Squash. Tomatoes. Peppers. Radishes.  Fruits: Apples. Apricots. Avocado. Berries. Bananas. Cherries. Dates. Figs. Grapes. Lemons. Melon. Oranges. Peaches. Plums. Pomegranate.  Meats and other protein foods: Beans. Almonds. Sunflower seeds. Pine nuts. Peanuts. [place]. Salmon. Scallops. Shrimp. [place]. Tilapia. Clams. Oysters. Eggs.  Dairy: Low-fat milk. Cheese. Greek yogurt.  Beverages: Water. Red wine. Herbal tea.  Fats and oils: Extra virgin olive oil. Avocado oil. Grape seed oil.  Sweets and  desserts: Greek yogurt with honey. Baked apples. Poached pears. Trail mix.  Seasoning and other foods: Basil. Cilantro. Coriander. Cumin. Mint. Parsley. Sage. Rosemary. Tarragon. Garlic. Oregano. Thyme. Pepper. Balsalmic vinegar. Tahini. Hummus. Tomato sauce. Olives. Mushrooms. ? Limit these  Grains: Prepackaged pasta or rice dishes. Prepackaged cereal with added sugar.  Vegetables: Deep fried potatoes (french fries).  Fruits: Fruit canned in syrup.  Meats and other protein foods: Beef. Pork. Lamb. Poultry with skin. Hot dogs. Bernie[place] Salines.  Dairy: Ice cream. Sour cream. Whole milk.  Beverages: Juice. Sugar-sweetened soft drinks. Beer. Liquor and spirits.  Fats and oils: Butter. Canola oil. Vegetable oil. Beef fat (tallow). Lard.  Sweets and desserts: Cookies. Cakes. Pies. Candy.  Seasoning and other foods: Mayonnaise. Premade sauces and marinades. The items listed may not be a complete list. Talk with your dietitian about what dietary choices are right for you. Summary  The Mediterranean diet includes both food and lifestyle choices.  Eat a variety of fresh  fruits and vegetables, beans, nuts, seeds, and whole grains.  Limit the amount of red meat and sweets that you eat.  Talk with your health care provider about whether it is safe for you to drink red wine in moderation. This means 1 glass a day for nonpregnant women and 2 glasses a day for men. A glass of wine equals 5 oz (150 mL). This information is not intended to replace advice given to you by your health care provider. Make sure you discuss any questions you have with your health care provider. Document Released: 06/08/2016 Document Revised: 06/15/2016 Document Reviewed: 06/08/2016 Elsevier Patient Education  2020 [employer].

## 2019-06-12 NOTE — Progress Notes (Signed)
Date:  06/12/2019   Name:  Patricia Lawson   DOB:  1946-08-05   MRN:  [id number]   Chief Complaint: Hypothyroidism and Diabetes  Diabetes She presents for her follow-up diabetic visit. She has type 2 diabetes mellitus. Her disease course has been stable. There are no hypoglycemic associated symptoms. Pertinent negatives for hypoglycemia include no dizziness, headaches, nervousness/anxiousness or tremors. Pertinent negatives for diabetes include no blurred vision, no chest pain, no fatigue, no foot paresthesias, no foot ulcerations, no polydipsia, no polyphagia, no polyuria, no visual change, no weakness and no weight loss. There are no hypoglycemic complications. Symptoms are stable. There are no diabetic complications. Risk factors for coronary artery disease include diabetes mellitus, dyslipidemia, hypertension, post-menopausal and stress. Current diabetic treatment includes oral agent (monotherapy). She is compliant with treatment all of the time. She is following a generally unhealthy diet. Meal planning includes avoidance of concentrated sweets and carbohydrate counting. She has not had a previous visit with a dietitian. She participates in exercise intermittently. Her breakfast blood glucose is taken between 8-9 am. Her breakfast blood glucose range is generally 110-130 mg/dl. An ACE inhibitor/angiotensin II receptor blocker is being taken. She does not see a podiatrist.Eye exam is not current.  Thyroid Problem Presents for follow-up visit. Symptoms include cold intolerance, hair loss and weight gain. Patient reports no anxiety, constipation, depressed mood, diaphoresis, diarrhea, dry skin, fatigue, heat intolerance, hoarse voice, leg swelling, menstrual problem, nail problem, palpitations, tremors, visual change or weight loss. The symptoms have been stable. Her past medical history is significant for diabetes and hyperlipidemia.  Hyperlipidemia This is a chronic problem. The current episode  started more than 1 year ago. The problem is controlled. Recent lipid tests were reviewed and are variable. Exacerbating diseases include diabetes and hypothyroidism. She has no history of chronic renal disease, liver disease, obesity or nephrotic syndrome. Pertinent negatives include no chest pain, focal sensory loss, focal weakness, leg pain, myalgias or shortness of breath. Current antihyperlipidemic treatment includes statins. The current treatment provides moderate improvement of lipids.    Review of Systems  Constitutional: Positive for weight gain. Negative for chills, diaphoresis, fatigue, fever, unexpected weight change and weight loss.  HENT: Negative for congestion, ear discharge, ear pain, hoarse voice, rhinorrhea, sinus pressure, sneezing and sore throat.   Eyes: Negative for blurred vision, photophobia, pain, discharge, redness and itching.  Respiratory: Negative for cough, shortness of breath, wheezing and stridor.   Cardiovascular: Negative for chest pain and palpitations.  Gastrointestinal: Negative for abdominal pain, blood in stool, constipation, diarrhea, nausea and vomiting.  Endocrine: Positive for cold intolerance. Negative for heat intolerance, polydipsia, polyphagia and polyuria.  Genitourinary: Negative for dysuria, flank pain, frequency, hematuria, menstrual problem, pelvic pain, urgency, vaginal bleeding and vaginal discharge.  Musculoskeletal: Negative for arthralgias, back pain and myalgias.  Skin: Negative for rash.  Allergic/Immunologic: Negative for environmental allergies and food allergies.  Neurological: Negative for dizziness, tremors, focal weakness, weakness, light-headedness, numbness and headaches.  Hematological: Negative for adenopathy. Does not bruise/bleed easily.  Psychiatric/Behavioral: Negative for dysphoric mood. The patient is not nervous/anxious.     Patient Active Problem List   Diagnosis Date Noted  . Hyperlipidemia 03/31/2019  . Myocardial  infarction ([place]) 03/31/2019  . Type 2 diabetes mellitus with hyperglycemia, without long-term current use of insulin ([place]) 02/10/2019  . Painful orthopaedic hardware ([place]) 12/11/2018  . History of patellar fracture 11/25/2018  . 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 Fall 06/21/2018  . RUE weakness  06/20/2018  . Facial laceration 06/20/2018  . HTN (hypertension) 06/20/2018  . CAD (coronary artery disease) 06/20/2018  . UTI (urinary tract infection) 06/20/2018  . Patella fracture 04/05/2018  . Appendicitis, acute 03/04/2015    No Known Allergies  Past Surgical History:  Procedure Laterality Date  . CARDIAC CATHETERIZATION  2010   5 stents placed  . CATARACT EXTRACTION W/ INTRAOCULAR LENS  IMPLANT, BILATERAL    . HARDWARE REMOVAL Right 12/11/2018   Procedure: SCREW REMOVAL FROM PATELLA;  Surgeon: Corky Mull, MD;  Location: [place];  Service: Orthopedics;  Laterality: Right;  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  . LAPAROSCOPIC APPENDECTOMY N/A 03/04/2015   Procedure: APPENDECTOMY LAPAROSCOPIC;  Surgeon: Sherri Rad, MD;  Location: [facility];  Service: General;  Laterality: N/A;  . ORIF PATELLA Right 04/06/2018   Procedure: OPEN REDUCTION INTERNAL (ORIF) FIXATION PATELLA;  Surgeon: Leim Fabry, MD;  Location: [facility];  Service: Orthopedics;  Laterality: Right;  . STENT PLACE LEFT URETER ([place] HX)     cardiac  . TENDON EXPLORATION Right 12/11/2018   Procedure: EXPLORATION OF PERONEAL NERVE LATERAL KNEE;  Surgeon: Corky Mull, MD;  Location: [place];  Service: Orthopedics;  Laterality: Right;  Diabetic - oral meds  . THYROIDECTOMY Bilateral     Social History   Tobacco Use  . Smoking status: Former Smoker    Types: Cigarettes    Quit date: 03/03/2009    Years since quitting: 10.2  . Smokeless tobacco: Never Used  Substance Use Topics  . Alcohol use: No  . Drug use: Never     Medication list has been reviewed and updated.  Current Meds  Medication  Sig  . acetaminophen (TYLENOL) 500 MG tablet Take 2 tablets (1,000 mg total) by mouth every 8 (eight) hours.  Marland Kitchen aspirin EC 81 MG EC tablet Take 1 tablet (81 mg total) by mouth daily.  Marland Kitchen atorvastatin (LIPITOR) 80 MG tablet Take 80 mg by mouth daily. Dr Clayborn Bigness  . irbesartan (AVAPRO) 75 MG tablet Take 75 mg by mouth daily. Dr Clayborn Bigness  . levothyroxine (SYNTHROID, LEVOTHROID) 25 MCG tablet One a day  . metFORMIN (GLUCOPHAGE-XR) 500 MG 24 hr tablet Take 1 tablet (500 mg total) by mouth daily with breakfast.  . metoprolol succinate (TOPROL-XL) 100 MG 24 hr tablet Take 100 mg by mouth daily. Dr Clayborn Bigness  . [DISCONTINUED] Multiple Vitamin (MULTIVITAMIN) tablet Take 1 tablet by mouth daily.    PHQ 2/9 Scores 06/12/2019 03/31/2019 02/10/2019 08/15/2018  PHQ - 2 Score 0 2 0 0  PHQ- 9 Score 0 2 0 0    BP Readings from Last 3 Encounters:  06/12/19 138/70  03/31/19 112/65  02/10/19 107/77    Physical Exam  Wt Readings from Last 3 Encounters:  06/12/19 132 lb (59.9 kg)  03/31/19 122 lb (55.3 kg)  02/10/19 122 lb (55.3 kg)    BP 138/70   Pulse 64   Ht 5\' 1"  (1.549 m)   Wt 132 lb (59.9 kg)   BMI 24.94 kg/m   Assessment and Plan:  1. Type 2 diabetes mellitus without complication, without long-term current use of insulin (HCC) Chronic.  Controlled.  Continue metformin XR 500 mg once a day.  Will check A1c microalbuminuria renal function panel. - HgB A1c - Microalbumin, urine - Renal Function Panel - Lipid Panel With LDL/HDL Ratio - metFORMIN (GLUCOPHAGE-XR) 500 MG 24 hr tablet; Take 1 tablet (500 mg total) by mouth daily with breakfast.  Dispense: 90 tablet;  Refill: 1  2. Hypothyroidism, unspecified type Patient with history of hypothyroid.  Will check TSH and if stable will continue 25 mcg of levothyroxine. - TSH  3. Taking medication for chronic disease Chronic.  Patient is on current dosing of statin will check liver function tests to evaluate for hepatotoxicity. - Hepatic function  panel  #4.  Hyperlipidemia currently on atorvastatin per Dr. Towanda Malkin. Chronic.  Controlled.  Will check lipid panel for evaluation and provide with low cholesterol triglyceride diet.

## 2019-06-13 LAB — LIPID PANEL WITH LDL/HDL RATIO
Cholesterol, Total: 158 mg/dL (ref 100–199)
HDL: 50 mg/dL (ref >39)
LDL Calculated: 72 mg/dL (ref 0–99)
LDl/HDL Ratio: 1.4 ratio (ref 0.0–3.2)
Triglycerides: 179 mg/dL — ABNORMAL HIGH (ref 0–149)
VLDL Cholesterol Cal: 36 mg/dL (ref 5–40)

## 2019-06-13 LAB — RENAL FUNCTION PANEL
Albumin: 4.4 g/dL (ref 3.7–4.7)
BUN/Creatinine Ratio: 19 (ref 12–28)
BUN: 14 mg/dL (ref 8–27)
CO2: 24 mmol/L (ref 20–29)
Calcium: 11.5 mg/dL — ABNORMAL HIGH (ref 8.7–10.3)
Chloride: 104 mmol/L (ref 96–106)
Creatinine, Ser: 0.74 mg/dL (ref 0.57–1.00)
GFR calc Af Amer: 94 mL/min/{1.73_m2} (ref >59)
GFR calc non Af Amer: 81 mL/min/{1.73_m2} (ref >59)
Glucose: 109 mg/dL — ABNORMAL HIGH (ref 65–99)
Phosphorus: 3 mg/dL (ref 3.0–4.3)
Potassium: 4.9 mmol/L (ref 3.5–5.2)
Sodium: 141 mmol/L (ref 134–144)

## 2019-06-13 LAB — HEPATIC FUNCTION PANEL
ALT: 14 IU/L (ref 0–32)
AST: 18 IU/L (ref 0–40)
Alkaline Phosphatase: 113 IU/L (ref 39–117)
Bilirubin Total: 0.5 mg/dL (ref 0.0–1.2)
Bilirubin, Direct: 0.15 mg/dL (ref 0.00–0.40)
Total Protein: 6.9 g/dL (ref 6.0–8.5)

## 2019-06-13 LAB — HEMOGLOBIN A1C
Est. average glucose Bld gHb Est-mCnc: 131 mg/dL
Hgb A1c MFr Bld: 6.2 % — ABNORMAL HIGH (ref 4.8–5.6)

## 2019-06-13 LAB — MICROALBUMIN, URINE: Microalbumin, Urine: 23.5 ug/mL

## 2019-06-13 LAB — TSH: TSH: 3.22 u[IU]/mL (ref 0.450–4.500)

## 2019-06-29 IMAGING — US US CAROTID DUPLEX BILAT
1 series · 13 of 24 positions shown · non-contrast
Comparison: None.

CLINICAL DATA: Right upper extremity weakness, hypertension,
coronary artery disease post stenting, previous tobacco abuse

EXAM:
BILATERAL CAROTID DUPLEX ULTRASOUND
TECHNIQUE: Gray scale imaging, color Doppler and duplex ultrasound were
performed of bilateral carotid and vertebral arteries in the neck.

[Series 1: us carotid duplex bilat · 13 of 64 slices shown]
[im 1/64]
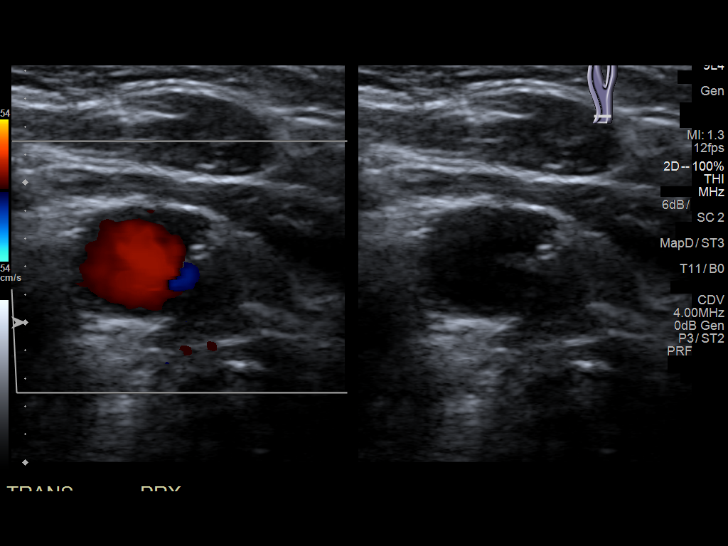
[im 6/64]
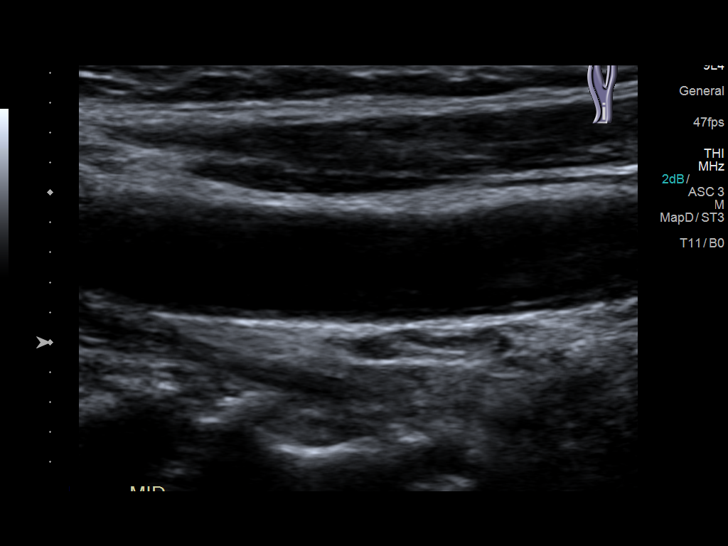
[im 11/64]
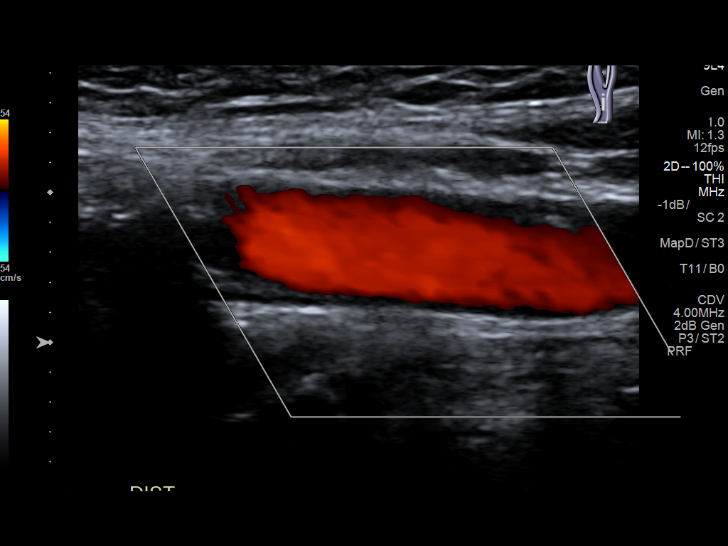
[im 17/64]
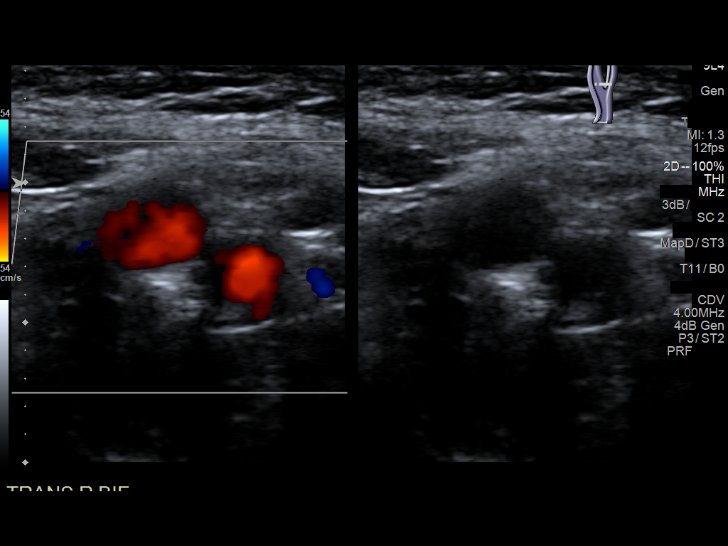
[im 22/64]
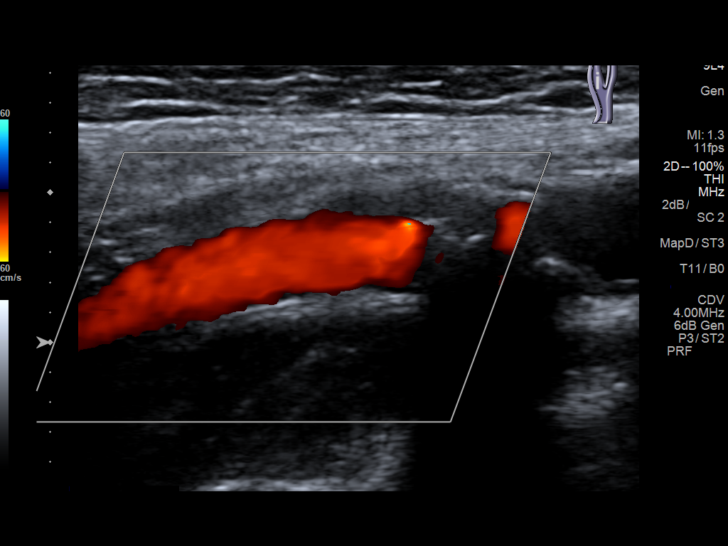
[im 28/64]
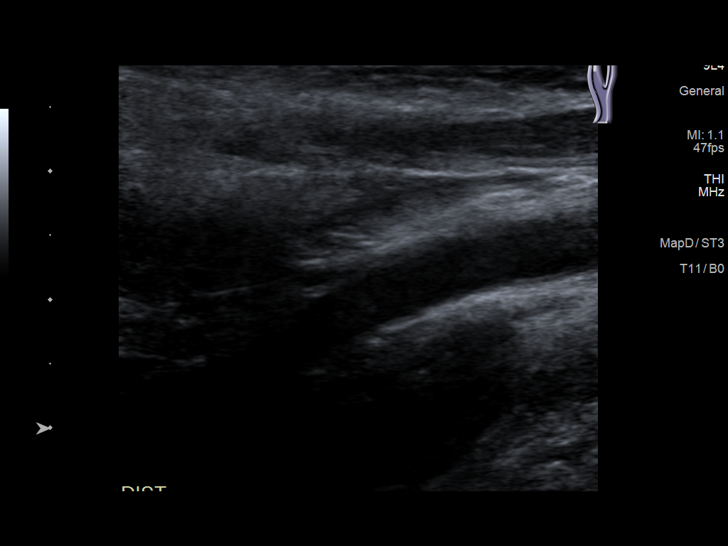
[im 33/64]
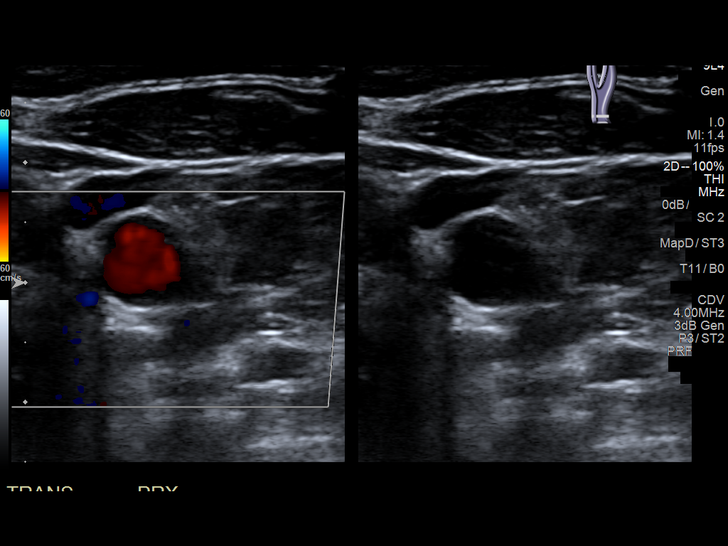
[im 36/64]
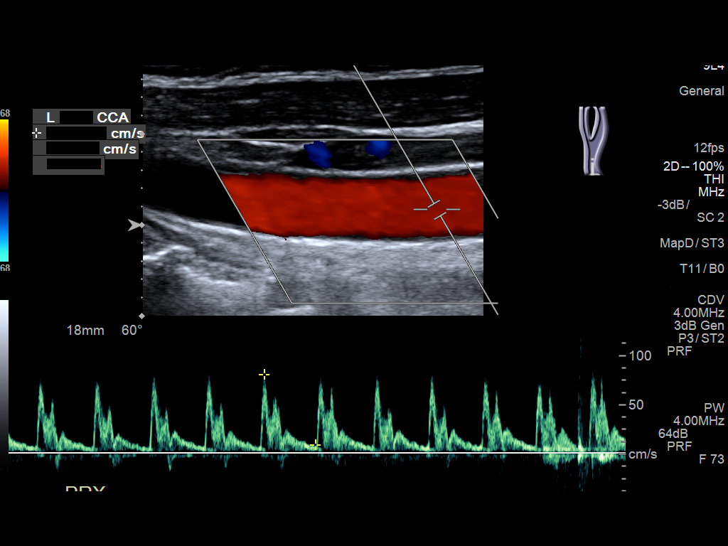
[im 42/64]
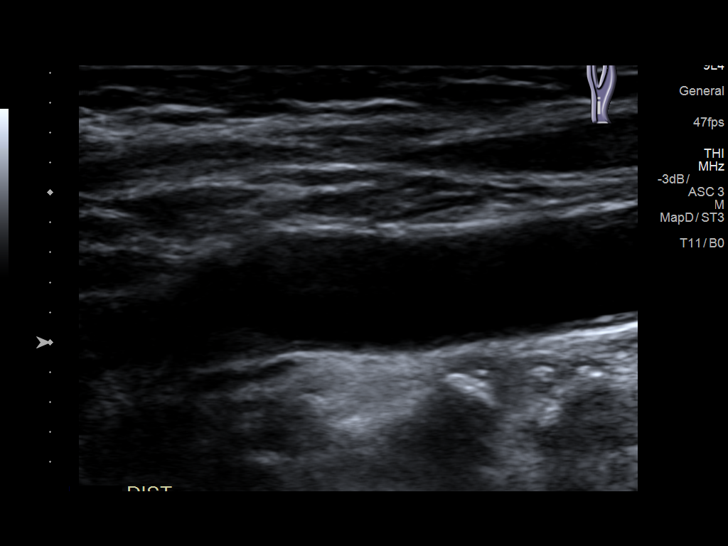
[im 47/64]
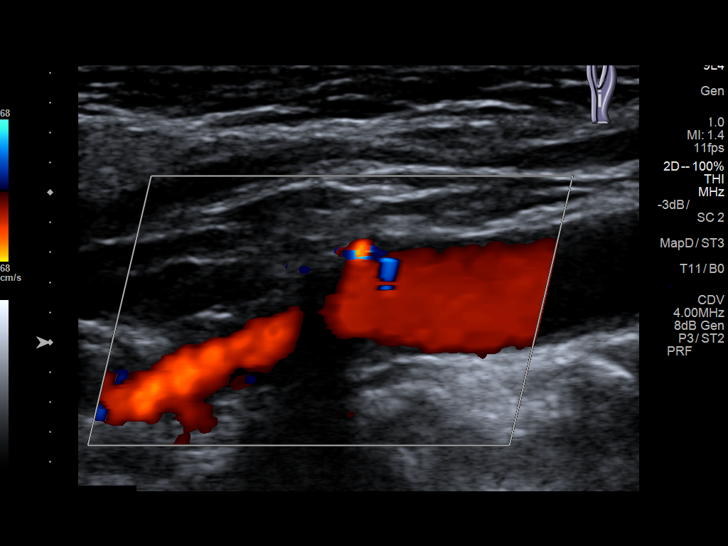
[im 53/64]
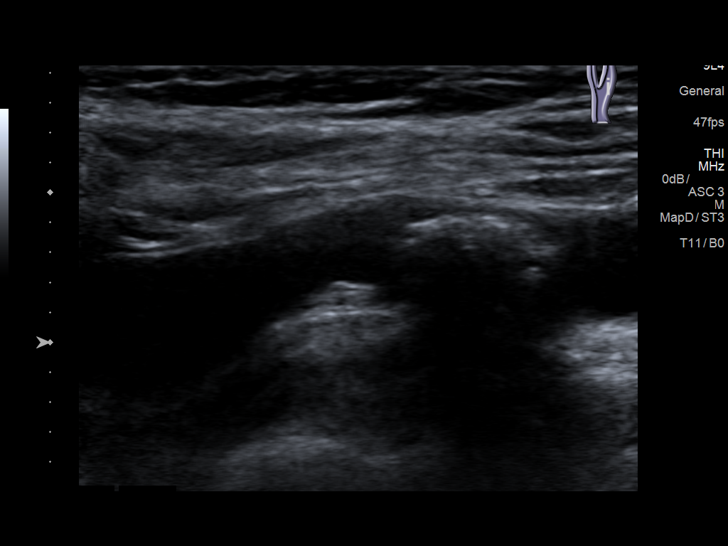
[im 58/64]
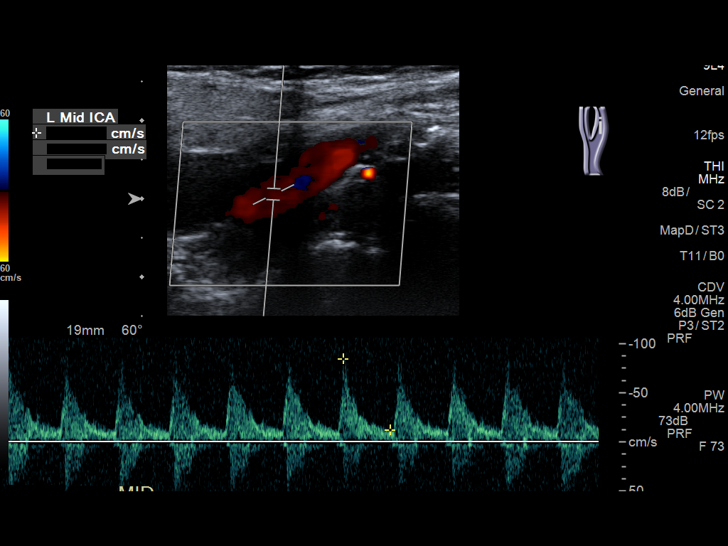
[im 64/64]
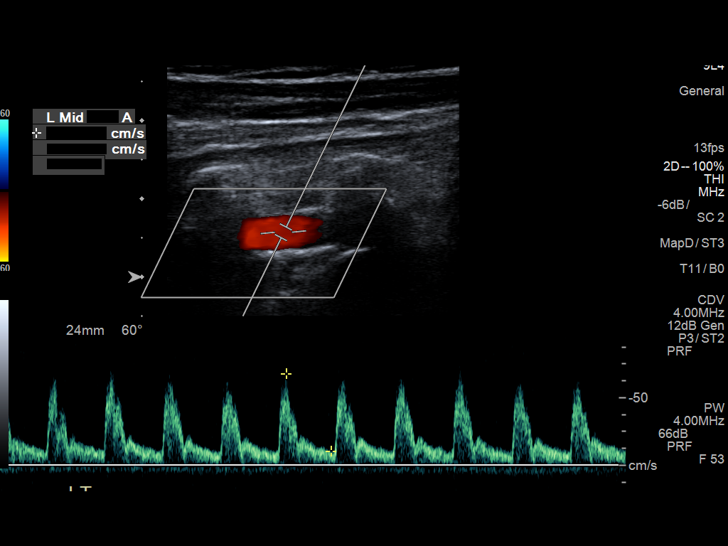

[13 of 24 positions shown; findings below may reference images not displayed]

FINDINGS: Criteria: Quantification of carotid stenosis is based on velocity
parameters that correlate the residual internal carotid diameter
with NASCET-based stenosis levels, using the diameter of the distal
internal carotid lumen as the denominator for stenosis measurement.

The following velocity measurements were obtained:

RIGHT

ICA: 105/15 cm/sec

CCA: 89/9 cm/sec

SYSTOLIC ICA/CCA RATIO:

ECA: 153 cm/sec

LEFT

ICA: 101/14 cm/sec

CCA: 93/10 cm/sec

SYSTOLIC ICA/CCA RATIO:

ECA: 255 cm/sec

RIGHT CAROTID ARTERY: Partially calcified plaque in the carotid bulb
resulting in at least mild stenosis. Some areas of distal acoustic
shadowing. Normal waveforms and color Doppler signal. Mildly
elevated peak systolic velocities in the proximal external carotid
artery, of doubtful clinical significance.

RIGHT VERTEBRAL ARTERY:  Normal flow direction and waveform.

LEFT CAROTID ARTERY: Partially calcified plaque at the carotid
bifurcation involving the external carotid artery and ICA origins.
Normal waveforms and color Doppler signal.

LEFT VERTEBRAL ARTERY:  Normal flow direction and waveform.
IMPRESSION: 1. Bilateral carotid bifurcation plaque resulting in less than 50%
diameter ICA stenosis.
2.  Antegrade bilateral vertebral arterial flow.

## 2019-10-17 IMAGING — DX DG CHEST 1V PORT
1 series · 1 of 1 positions shown · non-contrast
Comparison: Patient's prior chest x-ray is not available for
comparison.

CLINICAL DATA: Preoperative for right knee surgery tomorrow.

EXAM:
PORTABLE CHEST 1 VIEW

[chest ap]
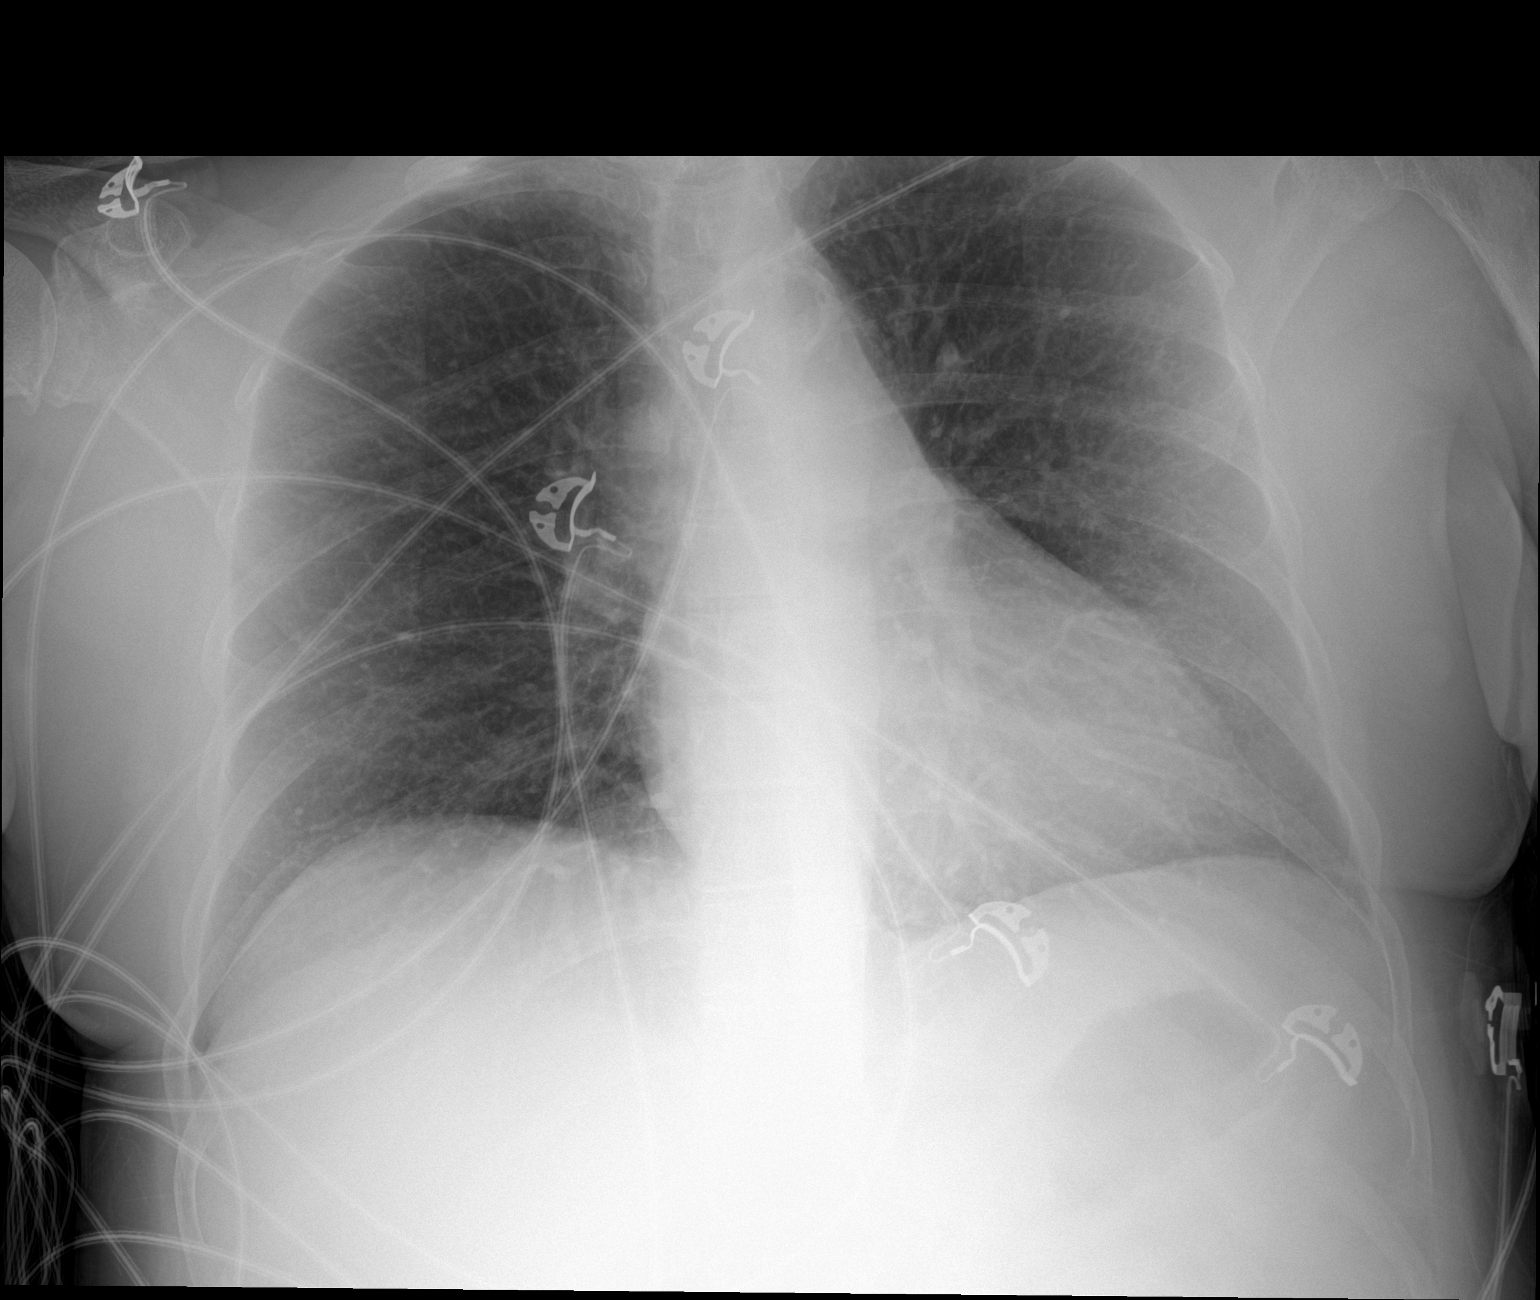

[1 of 1 positions shown; findings below may reference images not displayed]

FINDINGS: The heart size and mediastinal contours are within normal limits.
Both lungs are clear. The visualized skeletal structures are
unremarkable.
IMPRESSION: No active cardiopulmonary disease.

## 2019-11-28 ENCOUNTER — Other Ambulatory Visit: Payer: Self-pay

## 2019-11-28 DIAGNOSIS — R7989 Other specified abnormal findings of blood chemistry: Secondary | ICD-10-CM

## 2019-11-28 MED ORDER — LEVOTHYROXINE SODIUM 25 MCG PO TABS: ORAL_TABLET | ORAL | 0 refills | Status: DC

## 2019-12-16 ENCOUNTER — Encounter: Payer: Self-pay | Admitting: Family Medicine

## 2019-12-16 ENCOUNTER — Ambulatory Visit (INDEPENDENT_AMBULATORY_CARE_PROVIDER_SITE_OTHER): Payer: Medicare Other | Admitting: Family Medicine

## 2019-12-16 ENCOUNTER — Other Ambulatory Visit: Payer: Self-pay

## 2019-12-16 VITALS — BP 122/70 | HR 67 | Temp 97.7°F | Ht 61.0 in | Wt 145.2 lb

## 2019-12-16 DIAGNOSIS — R7989 Other specified abnormal findings of blood chemistry: Secondary | ICD-10-CM | POA: Diagnosis not present

## 2019-12-16 DIAGNOSIS — E039 Hypothyroidism, unspecified: Secondary | ICD-10-CM | POA: Insufficient documentation

## 2019-12-16 DIAGNOSIS — E119 Type 2 diabetes mellitus without complications: Secondary | ICD-10-CM | POA: Diagnosis not present

## 2019-12-16 MED ORDER — METFORMIN HCL ER 500 MG PO TB24: 500.0000 mg | ORAL_TABLET | Freq: Every day | ORAL | 1 refills | Status: DC

## 2019-12-16 MED ORDER — LEVOTHYROXINE SODIUM 25 MCG PO TABS: ORAL_TABLET | ORAL | 1 refills | Status: DC

## 2019-12-16 NOTE — Progress Notes (Signed)
Date:  12/16/2019   Name:  Patricia Lawson   DOB:  07-Feb-1946   MRN:  [id number]   Chief Complaint: Diabetes  Diabetes She presents for her follow-up diabetic visit. She has type 2 diabetes mellitus. Her disease course has been improving. There are no hypoglycemic associated symptoms. Pertinent negatives for hypoglycemia include no dizziness, headaches, nervousness/anxiousness or sweats. There are no diabetic associated symptoms. Pertinent negatives for diabetes include no blurred vision, no chest pain, no fatigue, no polydipsia, no polyphagia, no polyuria and no weakness. There are no hypoglycemic complications. Symptoms are stable. There are no diabetic complications. Pertinent negatives for diabetic complications include no CVA, PVD or retinopathy. There are no known risk factors for coronary artery disease. Current diabetic treatment includes oral agent (monotherapy). She is following a generally healthy diet. Meal planning includes avoidance of concentrated sweets and carbohydrate counting. She participates in exercise every other day. Her home blood glucose trend is fluctuating minimally. Her breakfast blood glucose is taken between 8-9 am. Her breakfast blood glucose range is generally 130-140 mg/dl. An ACE inhibitor/angiotensin II receptor blocker is being taken.  Hypertension This is a chronic problem. The current episode started more than 1 year ago. The problem has been gradually improving since onset. The problem is controlled. Pertinent negatives include no anxiety, blurred vision, chest pain, headaches, malaise/fatigue, neck pain, orthopnea, palpitations, peripheral edema, PND, shortness of breath or sweats. There are no associated agents to hypertension. Risk factors for coronary artery disease include post-menopausal state. Past treatments include ACE inhibitors. The current treatment provides moderate improvement. There is no history of angina, kidney disease, CAD/MI, CVA, heart  failure, left ventricular hypertrophy, PVD or retinopathy. Identifiable causes of hypertension include a thyroid problem. There is no history of chronic renal disease, a hypertension causing med or renovascular disease.  Thyroid Problem Presents for follow-up visit. Patient reports no anxiety, cold intolerance, constipation, diarrhea, fatigue, heat intolerance, menstrual problem or palpitations. The symptoms have been stable. Her past medical history is significant for diabetes and hyperlipidemia. There is no history of heart failure.  Hyperlipidemia This is a chronic problem. The current episode started more than 1 year ago. The problem is controlled. Recent lipid tests were reviewed and are normal. Exacerbating diseases include diabetes. She has no history of chronic renal disease, hypothyroidism, liver disease, obesity or nephrotic syndrome. Pertinent negatives include no chest pain, focal sensory loss, focal weakness, myalgias or shortness of breath. Current antihyperlipidemic treatment includes statins. The current treatment provides moderate improvement of lipids. There are no compliance problems.  Risk factors for coronary artery disease include dyslipidemia, diabetes mellitus, hypertension and post-menopausal.    Lab Results  Component Value Date   CREATININE 0.74 06/12/2019   BUN 14 06/12/2019   NA 141 06/12/2019   K 4.9 06/12/2019   CL 104 06/12/2019   CO2 24 06/12/2019   Lab Results  Component Value Date   CHOL 158 06/12/2019   HDL 50 06/12/2019   LDLCALC 72 06/12/2019   TRIG 179 (H) 06/12/2019   CHOLHDL 4.9 06/21/2018   Lab Results  Component Value Date   TSH 3.220 06/12/2019   Lab Results  Component Value Date   HGBA1C 6.2 (H) 06/12/2019     Review of Systems  Constitutional: Negative.  Negative for chills, fatigue, fever, malaise/fatigue and unexpected weight change.  HENT: Negative for congestion, ear discharge, ear pain, rhinorrhea, sinus pressure, sneezing and  sore throat.   Eyes: Negative for blurred vision, photophobia, pain, discharge,  redness and itching.  Respiratory: Negative for cough, shortness of breath, wheezing and stridor.   Cardiovascular: Negative for chest pain, palpitations, orthopnea and PND.  Gastrointestinal: Negative for abdominal pain, blood in stool, constipation, diarrhea, nausea and vomiting.  Endocrine: Negative for cold intolerance, heat intolerance, polydipsia, polyphagia and polyuria.  Genitourinary: Negative for dysuria, flank pain, frequency, hematuria, menstrual problem, pelvic pain, urgency, vaginal bleeding and vaginal discharge.  Musculoskeletal: Negative for arthralgias, back pain, myalgias and neck pain.  Skin: Negative for rash.  Allergic/Immunologic: Negative for environmental allergies and food allergies.  Neurological: Negative for dizziness, focal weakness, weakness, light-headedness, numbness and headaches.  Hematological: Negative for adenopathy. Does not bruise/bleed easily.  Psychiatric/Behavioral: Negative for dysphoric mood. The patient is not nervous/anxious.     Patient Active Problem List   Diagnosis Date Noted  . Hyperlipidemia 03/31/2019  . Myocardial infarction ([place]) 03/31/2019  . Type 2 diabetes mellitus with hyperglycemia, without long-term current use of insulin ([place]) 02/10/2019  . Painful orthopaedic hardware ([employer]) 12/11/2018  . History of patellar fracture 11/25/2018  . 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 Fall 06/21/2018  . RUE weakness 06/20/2018  . Facial laceration 06/20/2018  . HTN (hypertension) 06/20/2018  . CAD (coronary artery disease) 06/20/2018  . UTI (urinary tract infection) 06/20/2018  . Patella fracture 04/05/2018  . Appendicitis, acute 03/04/2015    No Known Allergies  Past Surgical History:  Procedure Laterality Date  . CARDIAC CATHETERIZATION  2010   5 stents placed  . CATARACT EXTRACTION W/ INTRAOCULAR LENS  IMPLANT, BILATERAL    . HARDWARE REMOVAL  Right 12/11/2018   Procedure: SCREW REMOVAL FROM PATELLA;  Surgeon: Corky Mull, MD;  Location: [facility];  Service: Orthopedics;  Laterality: Right;  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  . LAPAROSCOPIC APPENDECTOMY N/A 03/04/2015   Procedure: APPENDECTOMY LAPAROSCOPIC;  Surgeon: Sherri Rad, MD;  Location: [facility];  Service: General;  Laterality: N/A;  . ORIF PATELLA Right 04/06/2018   Procedure: OPEN REDUCTION INTERNAL (ORIF) FIXATION PATELLA;  Surgeon: Leim Fabry, MD;  Location: [facility];  Service: Orthopedics;  Laterality: Right;  . STENT PLACE LEFT URETER ([place] [facility])     cardiac  . TENDON EXPLORATION Right 12/11/2018   Procedure: EXPLORATION OF PERONEAL NERVE LATERAL KNEE;  Surgeon: Corky Mull, MD;  Location: [facility];  Service: Orthopedics;  Laterality: Right;  Diabetic - oral meds  . THYROIDECTOMY Bilateral     Social History   Tobacco Use  . Smoking status: Former Smoker    Types: Cigarettes    Quit date: 03/03/2009    Years since quitting: 10.7  . Smokeless tobacco: Never Used  Substance Use Topics  . Alcohol use: No  . Drug use: Never     Medication list has been reviewed and updated.  Current Meds  Medication Sig  . acetaminophen (TYLENOL) 500 MG tablet Take 2 tablets (1,000 mg total) by mouth every 8 (eight) hours.  Marland Kitchen aspirin EC 81 MG EC tablet Take 1 tablet (81 mg total) by mouth daily.  Marland Kitchen atorvastatin (LIPITOR) 80 MG tablet Take 1 tablet (80 mg total) by mouth daily. Dr Clayborn Bigness  . irbesartan (AVAPRO) 75 MG tablet Take 75 mg by mouth daily. Dr Clayborn Bigness  . levothyroxine (SYNTHROID) 25 MCG tablet One a day  . metFORMIN (GLUCOPHAGE-XR) 500 MG 24 hr tablet Take 1 tablet (500 mg total) by mouth daily with breakfast.  . metoprolol succinate (TOPROL-XL) 100 MG 24 hr tablet Take 100 mg by mouth daily.  Dr Clayborn Bigness    [facility] 2/9 Scores 06/12/2019 03/31/2019 02/10/2019 08/15/2018  PHQ - 2 Score 0 2 0 0  PHQ- 9 Score 0 2 0 0    BP Readings from Last 3  Encounters:  12/16/19 122/70  06/12/19 138/70  03/31/19 112/65    Physical Exam Vitals and nursing note reviewed.  Constitutional:      General: She is not in acute distress.    Appearance: She is not diaphoretic.  HENT:     Head: Normocephalic and atraumatic.     Right Ear: Tympanic membrane, ear canal and external ear normal.     Left Ear: Tympanic membrane, ear canal and external ear normal.     Nose: Nose normal. No congestion or rhinorrhea.  Eyes:     General:        Right eye: No discharge.        Left eye: No discharge.     Conjunctiva/sclera: Conjunctivae normal.     Pupils: Pupils are equal, round, and reactive to light.  Neck:     Thyroid: No thyromegaly.     Vascular: No JVD.  Cardiovascular:     Rate and Rhythm: Normal rate and regular rhythm.     Chest Wall: PMI is not displaced. No thrill.     Pulses: Normal pulses.     Heart sounds: Normal heart sounds, S1 normal and S2 normal. No murmur. No systolic murmur. No diastolic murmur. No friction rub. No gallop. No S3 or S4 sounds.   Pulmonary:     Effort: Pulmonary effort is normal.     Breath sounds: Normal breath sounds. No wheezing or rhonchi.  Abdominal:     General: Bowel sounds are normal.     Palpations: Abdomen is soft. There is no mass.     Tenderness: There is no abdominal tenderness. There is no guarding.  Musculoskeletal:        General: Normal range of motion.     Cervical back: Normal range of motion and neck supple.     Right lower leg: No edema.     Left lower leg: No edema.  Lymphadenopathy:     Cervical: No cervical adenopathy.  Skin:    General: Skin is warm and dry.  Neurological:     Mental Status: She is alert.     Deep Tendon Reflexes: Reflexes are normal and symmetric.     Wt Readings from Last 3 Encounters:  12/16/19 145 lb 3.2 oz (65.9 kg)  06/12/19 132 lb (59.9 kg)  03/31/19 122 lb (55.3 kg)    BP 122/70   Pulse 67   Temp 97.7 F (36.5 C) (Oral)   Ht 5\' 1"  (1.549 m)    Wt 145 lb 3.2 oz (65.9 kg)   SpO2 99%   BMI 27.44 kg/m   Assessment and Plan: 1. Elevated TSH Chronic.  Controlled.  Stable.  Will check TSH to evaluate level of control.  In the meantime we will continue levothyroxine 25 mcg daily. - levothyroxine (SYNTHROID) 25 MCG tablet; One a day  Dispense: 90 tablet; Refill: 1 - TSH  2. Type 2 diabetes mellitus without complication, without long-term current use of insulin (HCC) Chronic.  Controlled.  Stable.  Will continue Metformin XR 500 mg once a day.  We will check renal function panel and A1c for control. - metFORMIN (GLUCOPHAGE-XR) 500 MG 24 hr tablet; Take 1 tablet (500 mg total) by mouth daily with breakfast.  Dispense: 90 tablet; Refill: 1 -  Renal Function Panel - Hemoglobin A1c  3. Hypothyroidism, unspecified type As noted above.  Chronic.  Controlled.  Stable.  Continue continue current dosing of levothyroxine unless there is change necessitated by TSH range. - TSH

## 2019-12-17 ENCOUNTER — Other Ambulatory Visit: Payer: Self-pay

## 2019-12-17 LAB — HEMOGLOBIN A1C
Est. average glucose Bld gHb Est-mCnc: 154 mg/dL
Hgb A1c MFr Bld: 7 % — ABNORMAL HIGH (ref 4.8–5.6)

## 2019-12-17 LAB — RENAL FUNCTION PANEL
Albumin: 4.8 g/dL — ABNORMAL HIGH (ref 3.7–4.7)
BUN/Creatinine Ratio: 21 (ref 12–28)
BUN: 22 mg/dL (ref 8–27)
CO2: 21 mmol/L (ref 20–29)
Calcium: 12 mg/dL — ABNORMAL HIGH (ref 8.7–10.3)
Chloride: 104 mmol/L (ref 96–106)
Creatinine, Ser: 1.05 mg/dL — ABNORMAL HIGH (ref 0.57–1.00)
GFR calc Af Amer: 61 mL/min/{1.73_m2} (ref >59)
GFR calc non Af Amer: 53 mL/min/{1.73_m2} — ABNORMAL LOW (ref >59)
Glucose: 126 mg/dL — ABNORMAL HIGH (ref 65–99)
Phosphorus: 3 mg/dL (ref 3.0–4.3)
Potassium: 5.8 mmol/L — ABNORMAL HIGH (ref 3.5–5.2)
Sodium: 140 mmol/L (ref 134–144)

## 2019-12-17 LAB — TSH: TSH: 4.05 u[IU]/mL (ref 0.450–4.500)

## 2019-12-20 LAB — PARATHYROID HORMONE, INTACT (NO CA): PTH: 83 pg/mL — ABNORMAL HIGH (ref 15–65)

## 2019-12-22 ENCOUNTER — Other Ambulatory Visit: Payer: Self-pay

## 2019-12-22 DIAGNOSIS — E119 Type 2 diabetes mellitus without complications: Secondary | ICD-10-CM

## 2019-12-22 DIAGNOSIS — E349 Endocrine disorder, unspecified: Secondary | ICD-10-CM

## 2019-12-22 NOTE — Progress Notes (Unsigned)
Ref to endo for PTH and pick up diabetes.

## 2020-01-01 IMAGING — CT CT CERVICAL SPINE W/O CM
5 of 11 series · 9 of 33 positions shown, 10 images · non-contrast
Comparison: None.

CLINICAL DATA: Fall with laceration left periorbital region.

EXAM:
CT HEAD WITHOUT CONTRAST
CT MAXILLOFACIAL WITHOUT CONTRAST
CT CERVICAL SPINE WITHOUT CONTRAST
TECHNIQUE: Multidetector CT imaging of the head, cervical spine, and
maxillofacial structures were performed using the standard protocol
without intravenous contrast. Multiplanar CT image reconstructions
of the cervical spine and maxillofacial structures were also
generated.

[Series 6: max soft · axial · 0.33mm/px · z∈[+321,+377]mm · 2 of 85 slices shown]
[im 29/85  soft-tissue]
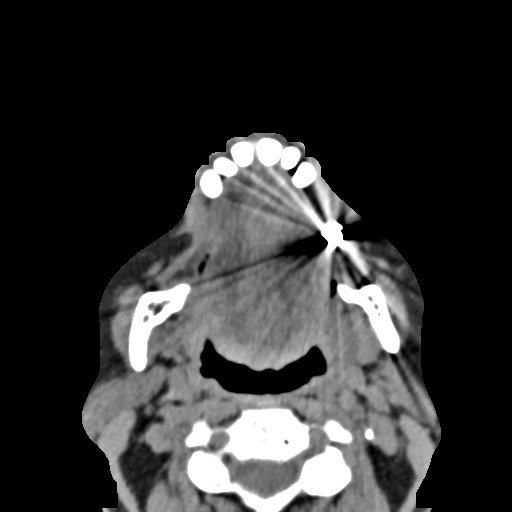
[im 57/85  soft-tissue]
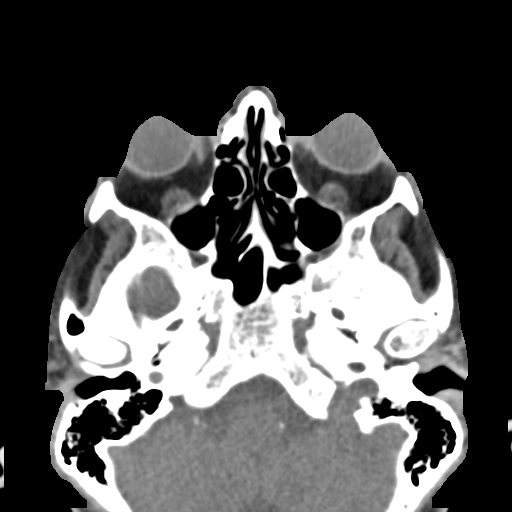

[Series 10: coronal soft · coronal · 0.36mm/px · 1 of 83 slices shown]
[im 42/83  bone]
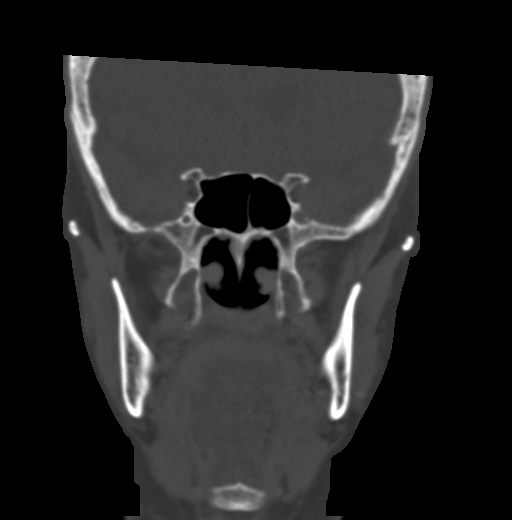

[Series 13: sagittal bone · sagittal · 0.37mm/px · 2 of 75 slices shown]
[im 25/75  bone]
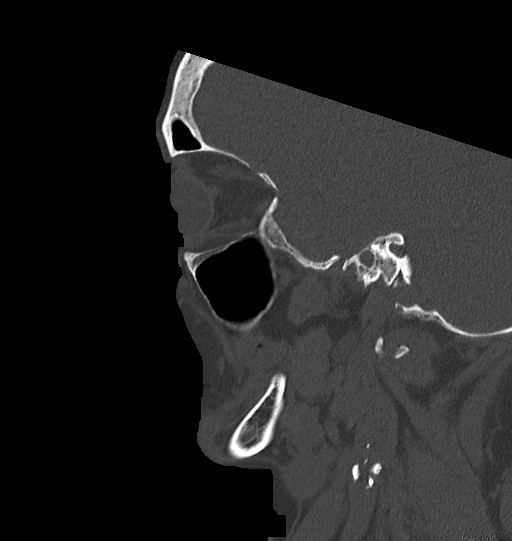
[im 50/75  bone]
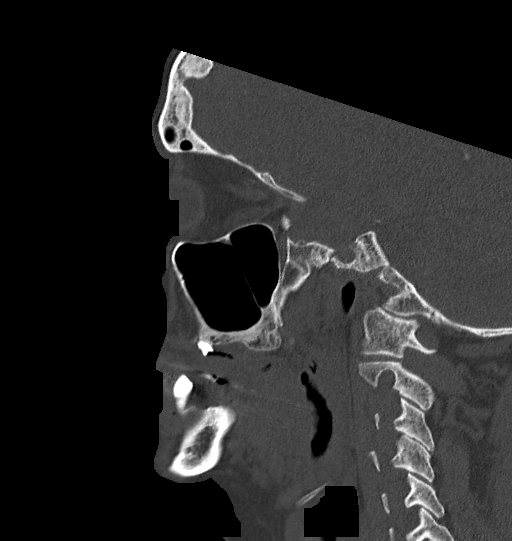

[Series 15: c spine soft · axial · 0.29mm/px · z∈[+297,+349]mm · 2 of 80 slices shown]
[im 27/80  soft-tissue]
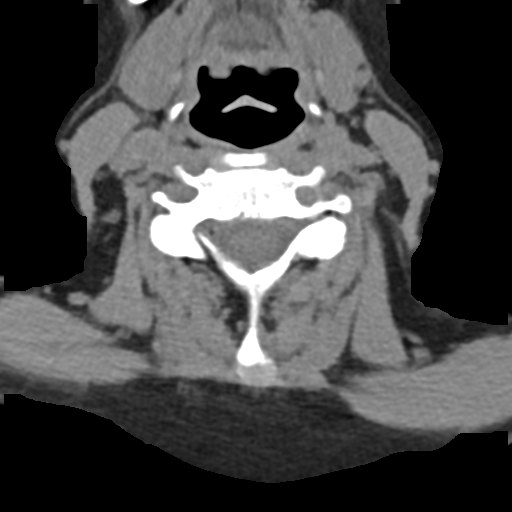
[im 53/80  soft-tissue]
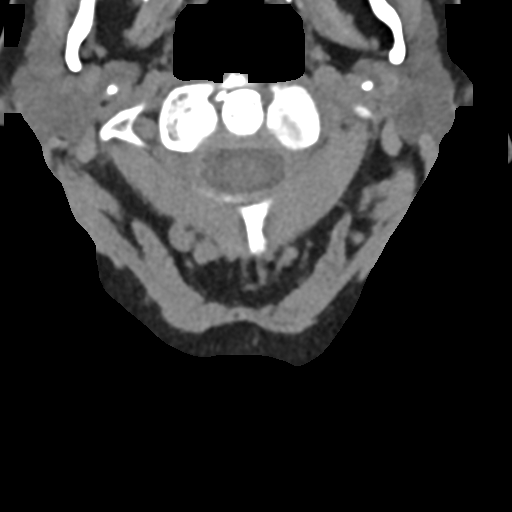

[Series 18: orthogonal bone · axial · 0.21mm/px · z∈[+270,+318]mm · 2 of 86 slices shown, 3 images]
[im 29/86  soft-tissue]
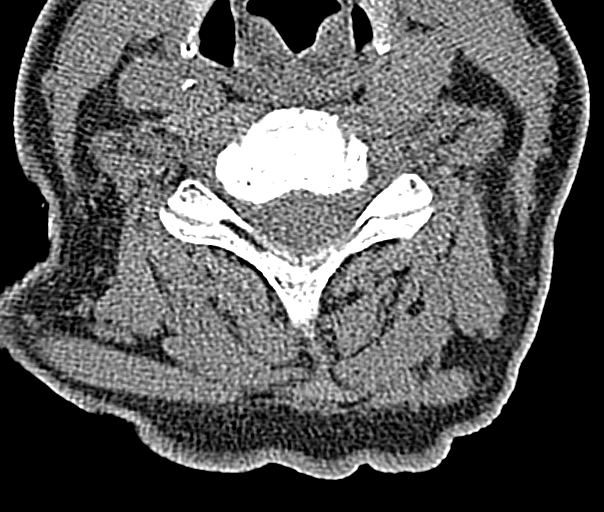
[im 29/86  bone]
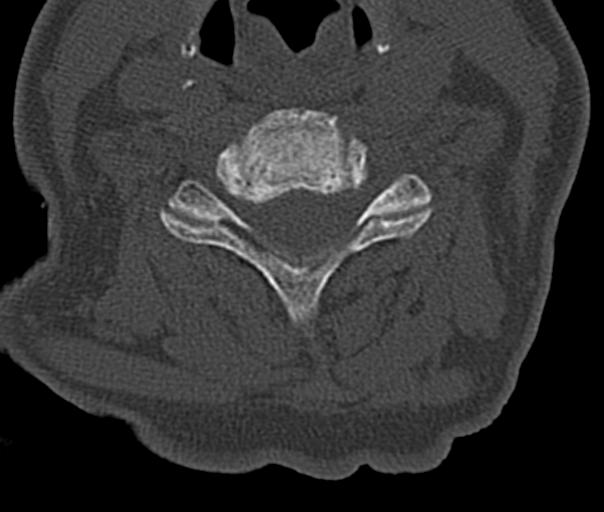
[im 57/86  bone]
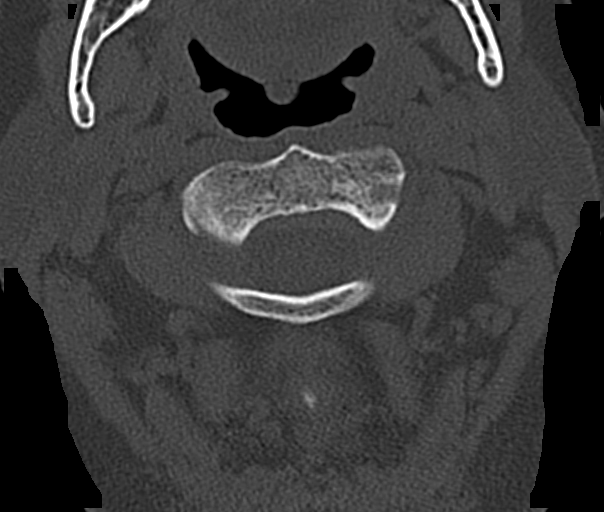

[9 of 33 positions shown; findings below may reference images not displayed]

FINDINGS: CT HEAD FINDINGS

Brain: Ventricles, cisterns and other CSF spaces are within normal.
There is no mass, mass effect, shift of midline structures or acute
hemorrhage. No evidence of acute infarction. Subtle chronic ischemic
microvascular disease.

Vascular: No hyperdense vessel or unexpected calcification.

Skull: Normal. Negative for fracture or focal lesion.

Other: None.

CT MAXILLOFACIAL FINDINGS

Osseous: No fracture or mandibular dislocation. No destructive
process.

Orbits: Negative. No traumatic or inflammatory finding.

Sinuses: Clear.

Soft tissues: Minimal left periorbital soft tissue
swelling/laceration.

CT CERVICAL SPINE FINDINGS

Alignment: Normal.

Skull base and vertebrae: Mild spondylosis throughout the cervical
spine. Vertebral body heights are maintained. Atlantoaxial
articulation is normal. There is mild uncovertebral joint spurring
and minimal facet arthropathy. There is no acute fracture or
subluxation. Right-sided neural foraminal narrowing at the C5-6
level and C6-7 levels.

Soft tissues and spinal canal: No prevertebral fluid or swelling. No
visible canal hematoma.

Disc levels: Mild disc space narrowing at the C3-4, C5-6 and C6-7
levels.

Upper chest: Negative.

Other: None.
IMPRESSION: No acute brain injury.

Minimal chronic ischemic microvascular disease.

No acute facial bone fracture. Minimal left periorbital soft tissue
swelling/laceration.

No acute cervical spine injury.

Mild spondylosis of the cervical spine with mild disc disease at the
C3-4, C5-6 and C6-7 levels. Right-sided neural foraminal narrowing
at the C5-6 and C6-7 levels.

## 2020-01-23 DIAGNOSIS — Z23 Encounter for immunization: Secondary | ICD-10-CM | POA: Diagnosis not present

## 2020-01-26 DIAGNOSIS — E213 Hyperparathyroidism, unspecified: Secondary | ICD-10-CM | POA: Diagnosis not present

## 2020-01-27 DIAGNOSIS — E213 Hyperparathyroidism, unspecified: Secondary | ICD-10-CM | POA: Diagnosis not present

## 2020-02-09 DIAGNOSIS — Z23 Encounter for immunization: Secondary | ICD-10-CM | POA: Diagnosis not present

## 2020-02-12 DIAGNOSIS — M810 Age-related osteoporosis without current pathological fracture: Secondary | ICD-10-CM | POA: Diagnosis not present

## 2020-02-12 DIAGNOSIS — E213 Hyperparathyroidism, unspecified: Secondary | ICD-10-CM | POA: Diagnosis not present

## 2020-03-02 DIAGNOSIS — E210 Primary hyperparathyroidism: Secondary | ICD-10-CM | POA: Diagnosis not present

## 2020-03-02 DIAGNOSIS — I252 Old myocardial infarction: Secondary | ICD-10-CM | POA: Diagnosis not present

## 2020-03-02 DIAGNOSIS — Z7982 Long term (current) use of aspirin: Secondary | ICD-10-CM | POA: Diagnosis not present

## 2020-03-02 DIAGNOSIS — E034 Atrophy of thyroid (acquired): Secondary | ICD-10-CM | POA: Diagnosis not present

## 2020-03-02 DIAGNOSIS — M799 Soft tissue disorder, unspecified: Secondary | ICD-10-CM | POA: Diagnosis not present

## 2020-03-02 DIAGNOSIS — E039 Hypothyroidism, unspecified: Secondary | ICD-10-CM | POA: Diagnosis not present

## 2020-03-02 DIAGNOSIS — E119 Type 2 diabetes mellitus without complications: Secondary | ICD-10-CM | POA: Diagnosis not present

## 2020-03-02 DIAGNOSIS — Z7984 Long term (current) use of oral hypoglycemic drugs: Secondary | ICD-10-CM | POA: Diagnosis not present

## 2020-03-02 DIAGNOSIS — Z87891 Personal history of nicotine dependence: Secondary | ICD-10-CM | POA: Diagnosis not present

## 2020-03-02 DIAGNOSIS — E213 Hyperparathyroidism, unspecified: Secondary | ICD-10-CM | POA: Diagnosis not present

## 2020-03-02 DIAGNOSIS — E785 Hyperlipidemia, unspecified: Secondary | ICD-10-CM | POA: Diagnosis not present

## 2020-03-02 DIAGNOSIS — D351 Benign neoplasm of parathyroid gland: Secondary | ICD-10-CM | POA: Diagnosis not present

## 2020-03-02 DIAGNOSIS — M810 Age-related osteoporosis without current pathological fracture: Secondary | ICD-10-CM | POA: Diagnosis not present

## 2020-03-02 DIAGNOSIS — I10 Essential (primary) hypertension: Secondary | ICD-10-CM | POA: Diagnosis not present

## 2020-03-02 DIAGNOSIS — I2510 Atherosclerotic heart disease of native coronary artery without angina pectoris: Secondary | ICD-10-CM | POA: Diagnosis not present

## 2020-03-30 ENCOUNTER — Telehealth: Payer: Self-pay | Admitting: Family Medicine

## 2020-03-30 NOTE — Telephone Encounter (Signed)
Left message for patient to call back and schedule Medicare Annual Wellness Visit (AWV) either virtually/audio only or in office. Whichever the patients preference is.  Last AWV 03/31/2019; please schedule 03/31/2020 OR AFTER with [facility]Nurse Health Advisor.  This should be a 40 minute visit.

## 2020-04-07 DIAGNOSIS — I10 Essential (primary) hypertension: Secondary | ICD-10-CM | POA: Diagnosis not present

## 2020-04-07 DIAGNOSIS — Z01818 Encounter for other preprocedural examination: Secondary | ICD-10-CM | POA: Diagnosis not present

## 2020-04-07 DIAGNOSIS — E782 Mixed hyperlipidemia: Secondary | ICD-10-CM | POA: Diagnosis not present

## 2020-04-07 DIAGNOSIS — I639 Cerebral infarction, unspecified: Secondary | ICD-10-CM | POA: Diagnosis not present

## 2020-04-07 DIAGNOSIS — E119 Type 2 diabetes mellitus without complications: Secondary | ICD-10-CM | POA: Diagnosis not present

## 2020-04-07 DIAGNOSIS — I2510 Atherosclerotic heart disease of native coronary artery without angina pectoris: Secondary | ICD-10-CM | POA: Diagnosis not present

## 2020-04-14 ENCOUNTER — Ambulatory Visit (INDEPENDENT_AMBULATORY_CARE_PROVIDER_SITE_OTHER): Payer: Medicare Other

## 2020-04-14 DIAGNOSIS — Z0000 Encounter for general adult medical examination without abnormal findings: Secondary | ICD-10-CM | POA: Diagnosis not present

## 2020-04-14 NOTE — Patient Instructions (Signed)
Patricia Lawson , Thank you for taking time to come for your Medicare Wellness Visit. I appreciate your ongoing commitment to your health goals. Please review the following plan we discussed and let me know if I can assist you in the future.   Screening recommendations/referrals: Colonoscopy: declined Mammogram: declined Bone Density: done 02/12/20. Repeat every 2 years.  Recommended yearly ophthalmology/optometry visit for glaucoma screening and checkup Recommended yearly dental visit for hygiene and checkup  Vaccinations: Influenza vaccine: declined Pneumococcal vaccine: done 08/15/18 Tdap vaccine: done 06/20/18 Shingles vaccine: Shingrix discussed. Please contact your pharmacy for coverage information.  Covid-19: done 02/02/20 & 02/23/20  Advanced directives: Advance directive discussed with you today. I have provided a copy for you to complete at home and have notarized. Once this is complete please bring a copy in to our office so we can scan it into your chart.  Conditions/risks identified: Recommend healthy eating and physical activity for desired weight loss.   Next appointment: Follow up in one year for your annual wellness visit    Preventive Care 65 Years and Older, Female Preventive care refers to lifestyle choices and visits with your health care provider that can promote health and wellness. What does preventive care include?  A yearly physical exam. This is also called an annual well check.  Dental exams once or twice a year.  Routine eye exams. Ask your health care provider how often you should have your eyes checked.  Personal lifestyle choices, including:  Daily care of your teeth and gums.  Regular physical activity.  Eating a healthy diet.  Avoiding tobacco and drug use.  Limiting alcohol use.  Practicing safe sex.  Taking low-dose aspirin every day.  Taking vitamin and mineral supplements as recommended by your health care provider. What happens during  an annual well check? The services and screenings done by your health care provider during your annual well check will depend on your age, overall health, lifestyle risk factors, and family history of disease. Counseling  Your health care provider may ask you questions about your:  Alcohol use.  Tobacco use.  Drug use.  Emotional well-being.  Home and relationship well-being.  Sexual activity.  Eating habits.  History of falls.  Memory and ability to understand (cognition).  Work and work Statistician.  Reproductive health. Screening  You may have the following tests or measurements:  Height, weight, and BMI.  Blood pressure.  Lipid and cholesterol levels. These may be checked every 5 years, or more frequently if you are over 55 years old.  Skin check.  Lung cancer screening. You may have this screening every year starting at age 63 if you have a 30-pack-year history of smoking and currently smoke or have quit within the past 15 years.  Fecal occult blood test (FOBT) of the stool. You may have this test every year starting at age 60.  Flexible sigmoidoscopy or colonoscopy. You may have a sigmoidoscopy every 5 years or a colonoscopy every 10 years starting at age 37.  Hepatitis C blood test.  Hepatitis B blood test.  Sexually transmitted disease (STD) testing.  Diabetes screening. This is done by checking your blood sugar (glucose) after you have not eaten for a while (fasting). You may have this done every 1-3 years.  Bone density scan. This is done to screen for osteoporosis. You may have this done starting at age 22.  Mammogram. This may be done every 1-2 years. Talk to your health care provider about how often you  should have regular mammograms. Talk with your health care provider about your test results, treatment options, and if necessary, the need for more tests. Vaccines  Your health care provider may recommend certain vaccines, such as:  Influenza  vaccine. This is recommended every year.  Tetanus, diphtheria, and acellular pertussis (Tdap, Td) vaccine. You may need a Td booster every 10 years.  Zoster vaccine. You may need this after age 13.  Pneumococcal 13-valent conjugate (PCV13) vaccine. One dose is recommended after age 56.  Pneumococcal polysaccharide (PPSV23) vaccine. One dose is recommended after age 47. Talk to your health care provider about which screenings and vaccines you need and how often you need them. This information is not intended to replace advice given to you by your health care provider. Make sure you discuss any questions you have with your health care provider. Document Released: 11/12/2015 Document Revised: 07/05/2016 Document Reviewed: 08/17/2015 Elsevier Interactive Patient Education  2017 [place] Prevention in the Home Falls can cause injuries. They can happen to people of all ages. There are many things you can do to make your home safe and to help prevent falls. What can I do on the outside of my home?  Regularly fix the edges of walkways and driveways and fix any cracks.  Remove anything that might make you trip as you walk through a door, such as a raised step or threshold.  Trim any bushes or trees on the path to your home.  Use bright outdoor lighting.  Clear any walking paths of anything that might make someone trip, such as rocks or tools.  Regularly check to see if handrails are loose or broken. Make sure that both sides of any steps have handrails.  Any raised decks and porches should have guardrails on the edges.  Have any leaves, snow, or ice cleared regularly.  Use sand or salt on walking paths during winter.  Clean up any spills in your garage right away. This includes oil or grease spills. What can I do in the bathroom?  Use night lights.  Install grab bars by the toilet and in the tub and shower. Do not use towel bars as grab bars.  Use non-skid mats or decals  in the tub or shower.  If you need to sit down in the shower, use a plastic, non-slip stool.  Keep the floor dry. Clean up any water that spills on the floor as soon as it happens.  Remove soap buildup in the tub or shower regularly.  Attach bath mats securely with double-sided non-slip rug tape.  Do not have throw rugs and other things on the floor that can make you trip. What can I do in the bedroom?  Use night lights.  Make sure that you have a light by your bed that is easy to reach.  Do not use any sheets or blankets that are too big for your bed. They should not hang down onto the floor.  Have a firm chair that has side arms. You can use this for support while you get dressed.  Do not have throw rugs and other things on the floor that can make you trip. What can I do in the kitchen?  Clean up any spills right away.  Avoid walking on wet floors.  Keep items that you use a lot in easy-to-reach places.  If you need to reach something above you, use a strong step stool that has a grab bar.  Keep electrical cords out  of the way.  Do not use floor polish or wax that makes floors slippery. If you must use wax, use non-skid floor wax.  Do not have throw rugs and other things on the floor that can make you trip. What can I do with my stairs?  Do not leave any items on the stairs.  Make sure that there are handrails on both sides of the stairs and use them. Fix handrails that are broken or loose. Make sure that handrails are as long as the stairways.  Check any carpeting to make sure that it is firmly attached to the stairs. Fix any carpet that is loose or worn.  Avoid having throw rugs at the top or bottom of the stairs. If you do have throw rugs, attach them to the floor with carpet tape.  Make sure that you have a light switch at the top of the stairs and the bottom of the stairs. If you do not have them, ask someone to add them for you. What else can I do to help  prevent falls?  Wear shoes that:  Do not have high heels.  Have rubber bottoms.  Are comfortable and fit you well.  Are closed at the toe. Do not wear sandals.  If you use a stepladder:  Make sure that it is fully opened. Do not climb a closed stepladder.  Make sure that both sides of the stepladder are locked into place.  Ask someone to hold it for you, if possible.  Clearly mark and make sure that you can see:  Any grab bars or handrails.  First and last steps.  Where the edge of each step is.  Use tools that help you move around (mobility aids) if they are needed. These include:  Canes.  Walkers.  Scooters.  Crutches.  Turn on the lights when you go into a dark area. Replace any light bulbs as soon as they burn out.  Set up your furniture so you have a clear path. Avoid moving your furniture around.  If any of your floors are uneven, fix them.  If there are any pets around you, be aware of where they are.  Review your medicines with your doctor. Some medicines can make you feel dizzy. This can increase your chance of falling. Ask your doctor what other things that you can do to help prevent falls. This information is not intended to replace advice given to you by your health care provider. Make sure you discuss any questions you have with your health care provider. Document Released: 08/12/2009 Document Revised: 03/23/2016 Document Reviewed: 11/20/2014 Elsevier Interactive Patient Education  2017 [employer].

## 2020-04-14 NOTE — Progress Notes (Signed)
Subjective:   Patricia Lawson is a 74 y.o. female who presents for Medicare Annual (Subsequent) preventive examination.  Virtual Visit via Telephone Note  I connected with  Patricia Lawson on 04/14/20 at  1:20 PM EDT by telephone and verified that I am speaking with the correct person using two identifiers.  Medicare Annual Wellness visit completed telephonically due to Covid-19 pandemic.   Location: Patient: home Provider: office   I discussed the limitations, risks, security and privacy concerns of performing an evaluation and management service by telephone and the availability of in person appointments. The patient expressed understanding and agreed to proceed.  Unable to perform video visit due to video visit attempted and failed and/or patient does not have video capability.   Some vital signs may be absent or patient reported.   [name], LPN    Review of Systems:   Cardiac Risk Factors include: advanced age (>65men, >65 women);diabetes mellitus;dyslipidemia;hypertension     Objective:     Vitals: There were no vitals taken for this visit.  There is no height or weight on file to calculate BMI.  Advanced Directives 04/14/2020 03/31/2019 12/11/2018 06/20/2018 06/20/2018 04/05/2018 03/04/2015  Does Patient Have a Medical Advance Directive? No No No No No No No  Would patient like information on creating a medical advance directive? Yes (MAU/Ambulatory/Procedural Areas - Information given) Yes (MAU/Ambulatory/Procedural Areas - Information given) No - Patient declined No - Patient declined No - Patient declined No - Patient declined -    Tobacco Social History   Tobacco Use  Smoking Status Former Smoker  . Types: Cigarettes  . Quit date: 03/03/2009  . Years since quitting: 11.1  Smokeless Tobacco Never Used     Counseling given: Not Answered   Clinical Intake:  Pre-visit preparation completed: Yes  Pain : No/denies pain     Nutritional Risks:  None Diabetes: Yes CBG done?: No Did pt. bring in CBG monitor from home?: No   Nutrition Risk Assessment:  Has the patient had any N/V/D within the last 2 months?  No  Does the patient have any non-healing wounds?  No  Has the patient had any unintentional weight loss or weight gain?  No   Diabetes:  Is the patient diabetic?  Yes  If diabetic, was a CBG obtained today?  No  Did the patient bring in their glucometer from home?  No  How often do you monitor your CBG's? Twice a week.   Financial Strains and Diabetes Management:  Are you having any financial strains with the device, your supplies or your medication? No .  Does the patient want to be seen by Chronic Care Management for management of their diabetes?  No  Would the patient like to be referred to a Nutritionist or for Diabetic Management?  No   Diabetic Exams:  Diabetic Eye Exam:. Overdue for diabetic eye exam. Pt has been advised about the importance in completing this exam.   Diabetic Foot Exam: Completed 06/12/19.   How often do you need to have someone help you when you read instructions, pamphlets, or other written materials from your doctor or pharmacy?: 1 - Never  Interpreter Needed?: No  Information entered by :: [name] LPN  Past Medical History:  Diagnosis Date  . CHF (congestive heart failure) ([place]) 2010  . Coronary artery disease   . Diabetes mellitus without complication ([place])    type 2  . Hyperlipidemia   . Hypertension   . Myocardial infarction (  [place]) 2010   5 stents placed  . Stroke [facility]) 05/2018   no deficits   Past Surgical History:  Procedure Laterality Date  . CARDIAC CATHETERIZATION  2010   5 stents placed  . CATARACT EXTRACTION W/ INTRAOCULAR LENS  IMPLANT, BILATERAL    . HARDWARE REMOVAL Right 12/11/2018   Procedure: SCREW REMOVAL FROM PATELLA;  Surgeon: Corky Mull, MD;  Location: [place];  Service: Orthopedics;  Laterality: Right;  2.5 MM INOVATION BIOMET  SCREWDRIVER  . LAPAROSCOPIC APPENDECTOMY N/A 03/04/2015   Procedure: APPENDECTOMY LAPAROSCOPIC;  Surgeon: Sherri Rad, MD;  Location: [facility];  Service: General;  Laterality: N/A;  . ORIF PATELLA Right 04/06/2018   Procedure: OPEN REDUCTION INTERNAL (ORIF) FIXATION PATELLA;  Surgeon: Leim Fabry, MD;  Location: [facility];  Service: Orthopedics;  Laterality: Right;  . STENT PLACE LEFT URETER ([place] [facility])     cardiac  . TENDON EXPLORATION Right 12/11/2018   Procedure: EXPLORATION OF PERONEAL NERVE LATERAL KNEE;  Surgeon: Corky Mull, MD;  Location: [place];  Service: Orthopedics;  Laterality: Right;  Diabetic - oral meds  . THYROIDECTOMY Bilateral    Family History  Problem Relation Age of Onset  . Heart block Mother   . Heart disease Mother   . Hypertension Mother   . Heart block Father   . Heart disease Father   . Hypertension Father    Social History   Socioeconomic History  . Marital status: Divorced    Spouse name: Not on file  . Number of children: 2  . Years of education: Not on file  . Highest education level: Some college, no degree  Occupational History  . Occupation: retired  Tobacco Use  . Smoking status: Former Smoker    Types: Cigarettes    Quit date: 03/03/2009    Years since quitting: 11.1  . Smokeless tobacco: Never Used  Vaping Use  . Vaping Use: Never used  Substance and Sexual Activity  . Alcohol use: No  . Drug use: Never  . Sexual activity: Not Currently  Other Topics Concern  . Not on file  Social History Narrative   Pt lives alone.    Social Determinants of Health   Financial Resource Strain: Low Risk   . Difficulty of Paying Living Expenses: Not very hard  Food Insecurity: No Food Insecurity  . Worried About Charity fundraiser in the Last Year: Never true  . Ran Out of Food in the Last Year: Never true  Transportation Needs: No Transportation Needs  . Lack of Transportation (Medical): No  . Lack of Transportation (Non-Medical): No   Physical Activity: Insufficiently Active  . Days of Exercise per Week: 7 days  . Minutes of Exercise per Session: 20 min  Stress: No Stress Concern Present  . Feeling of Stress : Only a little  Social Connections: Moderately Isolated  . Frequency of Communication with Friends and Family: More than three times a week  . Frequency of Social Gatherings with Friends and Family: Three times a week  . Attends Religious Services: More than 4 times per year  . Active Member of Clubs or Organizations: No  . Attends Archivist Meetings: Never  . Marital Status: Divorced    Outpatient Encounter Medications as of 04/14/2020  Medication Sig  . Cholecalciferol 50 MCG (2000 UT) CAPS Take 1 capsule by mouth daily.  Marland Kitchen aspirin EC 81 MG EC tablet Take 1 tablet (81 mg total) by mouth daily.  Marland Kitchen  atorvastatin (LIPITOR) 80 MG tablet Take 1 tablet (80 mg total) by mouth daily. Dr Clayborn Bigness  . irbesartan (AVAPRO) 75 MG tablet Take 75 mg by mouth daily. Dr Clayborn Bigness  . levothyroxine (SYNTHROID) 25 MCG tablet One a day  . metFORMIN (GLUCOPHAGE-XR) 500 MG 24 hr tablet Take 1 tablet (500 mg total) by mouth daily with breakfast.  . metoprolol succinate (TOPROL-XL) 100 MG 24 hr tablet Take 100 mg by mouth daily. Dr Clayborn Bigness  . [DISCONTINUED] acetaminophen (TYLENOL) 500 MG tablet Take 2 tablets (1,000 mg total) by mouth every 8 (eight) hours.   No facility-administered encounter medications on file as of 04/14/2020.    Activities of Daily Living In your present state of health, do you have any difficulty performing the following activities: 04/14/2020  Hearing? N  Comment declines hearing aids  Vision? N  Difficulty concentrating or making decisions? N  Walking or climbing stairs? N  Dressing or bathing? N  Doing errands, shopping? N  Preparing Food and eating ? N  Using the Toilet? N  In the past six months, have you accidently leaked urine? N  Do you have problems with loss of bowel control? N   Managing your Medications? N  Managing your Finances? N  Housekeeping or managing your Housekeeping? N  Some recent data might be hidden    Patient Care Team: Juline Patch, MD as PCP - General (Family Medicine) Yolonda Kida, MD as Consulting Physician (Cardiology)    Assessment:   This is a routine wellness examination for Jazlyne.  Exercise Activities and Dietary recommendations Current Exercise Habits: Home exercise routine, Type of exercise: walking, Time (Minutes): 20, Frequency (Times/Week): 7, Weekly Exercise (Minutes/Week): 140, Intensity: Mild, Exercise limited by: None identified  Goals    . Increase physical activity     Pt would like to increase physical activity and walking more.     . Weight (lb) < 140 lb (63.5 kg)     Pt states she would like to lose 10 lbs over the next year with diet and exercise       Fall Risk Fall Risk  04/14/2020 03/31/2019 07/02/2018  Falls in the past year? 0 0 Yes  Comment - - 06/20/2018  Number falls in past yr: 0 0 2 or more  Injury with Fall? 0 0 Yes  Risk for fall due to : No Fall Risks - -  Follow up Falls prevention discussed Falls prevention discussed Falls evaluation completed   [place]:  Any stairs in or around the home? Yes  If so, are there any without handrails? Yes   Home free of loose throw rugs in walkways, pet beds, electrical cords, etc? Yes  Adequate lighting in your home to reduce risk of falls? Yes   ASSISTIVE DEVICES UTILIZED TO PREVENT FALLS:  Life alert? No  Use of a cane, walker or w/c? No  Grab bars in the bathroom? No  Shower chair or bench in shower? Yes  Elevated toilet seat or a handicapped toilet? No   DME ORDERS:  DME order needed?  No   TIMED UP AND GO:  Was the test performed? No . Telephonic visit.   Education: Fall risk prevention has been discussed.  Intervention(s) required? No    Depression Screen PHQ 2/9 Scores 04/14/2020 06/12/2019  03/31/2019 02/10/2019  PHQ - 2 Score 0 0 2 0  PHQ- 9 Score - 0 2 0     Cognitive Function  6CIT Screen 04/14/2020 03/31/2019  What Year? 0 points 0 points  What month? 0 points 0 points  What time? 0 points 0 points  Count back from 20 0 points 0 points  Months in reverse 0 points 0 points  Repeat phrase 0 points 0 points  Total Score 0 0    Immunization History  Administered Date(s) Administered  . PFIZER SARS-COV-2 Vaccination 02/02/2020, 02/23/2020  . Pneumococcal Conjugate-13 08/15/2018  . Tdap 08/17/2015, 06/20/2018    Qualifies for Shingles Vaccine? Yes  . Due for Shingrix. Education has been provided regarding the importance of this vaccine. Pt has been advised to call insurance company to determine out of pocket expense. Advised may also receive vaccine at local pharmacy or Health Dept. Verbalized acceptance and understanding.  Tdap: Up to date  Flu Vaccine: Due for Flu vaccine. Does the patient want to receive this vaccine today?  No . Education has been provided regarding the importance of this vaccine but still declined. Advised may receive this vaccine at local pharmacy or Health Dept. Aware to provide a copy of the vaccination record if obtained from local pharmacy or Health Dept. Verbalized acceptance and understanding.  Pneumococcal Vaccine: Due for Pneumococcal vaccine. Does the patient want to receive this vaccine today?  No . Education has been provided regarding the importance of this vaccine but still declined. Advised may receive this vaccine at local pharmacy or Health Dept. Aware to provide a copy of the vaccination record if obtained from local pharmacy or Health Dept. Verbalized acceptance and understanding.  Covid-19 Vaccine: Up to date   Screening Tests Health Maintenance  Topic Date Due  . Hepatitis C Screening  Never done  . OPHTHALMOLOGY EXAM  Never done  . MAMMOGRAM  Never done  . COLONOSCOPY  Never done  . PNA vac Low Risk Adult (2 of 2 -  PPSV23) 08/16/2019  . INFLUENZA VACCINE  05/30/2020  . FOOT EXAM  06/11/2020  . HEMOGLOBIN A1C  06/14/2020  . TETANUS/TDAP  06/20/2028  . DEXA SCAN  Completed  . COVID-19 Vaccine  Completed    Cancer Screenings:  Colorectal Screening: Not completed. Pt declines.  Mammogram: DUE.  Pt declines at this time.   Bone Density: Completed 02/12/20. Results reflect OSTEOPOROSIS. Repeat every 2 years.   Lung Cancer Screening: (Low Dose CT Chest recommended if Age 49-80 years, 30 pack-year currently smoking OR have quit w/in 15years.) does not qualify.   Additional Screening:  Hepatitis C Screening: does qualify; postponed  Vision Screening: Recommended annual ophthalmology exams for early detection of glaucoma and other disorders of the eye. Is the patient up to date with their annual eye exam?  No  - postponed due to Covid Who is the provider or what is the name of the office in which the pt attends annual eye exams? Dr. Wyatt Portela at [place]: Recommended annual dental exams for proper oral hygiene  Community Resource Referral:  CRR required this visit?  No      Plan:     I have personally reviewed and addressed the Medicare Annual Wellness questionnaire and have noted the following in the patient's chart:  A. Medical and social history B. Use of alcohol, tobacco or illicit drugs  C. Current medications and supplements D. Functional ability and status E.  Nutritional status F.  Physical activity G. Advance directives H. List of other physicians I.  Hospitalizations, surgeries, and ER visits in previous 12 months J.  [place] such as  hearing and vision if needed, cognitive and depression L. Referrals and appointments   In addition, I have reviewed and discussed with patient certain preventive protocols, quality metrics, and best practice recommendations. A written personalized care plan for preventive services as well as general preventive health  recommendations were provided to patient.   Signed,  [name], LPN Nurse Health Advisor   Nurse Notes: pt states she was seen Dr. Maudie Mercury with [facility] regarding her parathyroid and would like to discuss options with Dr. Ronnald Ramp. She isn't comfortable with surgery. Pt to discuss at follow up appt In August.

## 2020-06-14 ENCOUNTER — Other Ambulatory Visit
Admission: RE | Admit: 2020-06-14 | Discharge: 2020-06-14 | Disposition: A | Discharge status: Home / Self Care | Payer: Medicare Other | Attending: Family Medicine | Admitting: Family Medicine

## 2020-06-14 ENCOUNTER — Other Ambulatory Visit: Payer: Self-pay

## 2020-06-14 ENCOUNTER — Ambulatory Visit (INDEPENDENT_AMBULATORY_CARE_PROVIDER_SITE_OTHER): Payer: Medicare Other | Admitting: Family Medicine

## 2020-06-14 ENCOUNTER — Encounter: Payer: Self-pay | Admitting: Family Medicine

## 2020-06-14 VITALS — BP 120/70 | HR 65 | Ht 61.0 in | Wt 152.0 lb

## 2020-06-14 DIAGNOSIS — E039 Hypothyroidism, unspecified: Secondary | ICD-10-CM | POA: Insufficient documentation

## 2020-06-14 DIAGNOSIS — Z23 Encounter for immunization: Secondary | ICD-10-CM

## 2020-06-14 DIAGNOSIS — E119 Type 2 diabetes mellitus without complications: Secondary | ICD-10-CM | POA: Insufficient documentation

## 2020-06-14 DIAGNOSIS — E210 Primary hyperparathyroidism: Secondary | ICD-10-CM | POA: Insufficient documentation

## 2020-06-14 LAB — RENAL FUNCTION PANEL
Albumin: 4.2 g/dL (ref 3.5–5.0)
Anion gap: 7 (ref 5–15)
BUN: 17 mg/dL (ref 8–23)
CO2: 23 mmol/L (ref 22–32)
Calcium: 10.5 mg/dL — ABNORMAL HIGH (ref 8.9–10.3)
Chloride: 107 mmol/L (ref 98–111)
Creatinine, Ser: 0.9 mg/dL (ref 0.44–1.00)
GFR calc Af Amer: >60 mL/min (ref >60)
GFR calc non Af Amer: >60 mL/min (ref >60)
Glucose, Bld: 122 mg/dL — ABNORMAL HIGH (ref 70–99)
Phosphorus: 2.8 mg/dL (ref 2.5–4.6)
Potassium: 4.6 mmol/L (ref 3.5–5.1)
Sodium: 137 mmol/L (ref 135–145)

## 2020-06-14 LAB — T4, FREE: Free T4: 1.03 ng/dL (ref 0.61–1.12)

## 2020-06-14 LAB — HEMOGLOBIN A1C
Hgb A1c MFr Bld: 7.4 % — ABNORMAL HIGH (ref 4.8–5.6)
Mean Plasma Glucose: 165.68 mg/dL

## 2020-06-14 LAB — TSH: TSH: 3.567 u[IU]/mL (ref 0.350–4.500)

## 2020-06-14 MED ORDER — METFORMIN HCL ER 500 MG PO TB24: 500.0000 mg | ORAL_TABLET | Freq: Every day | ORAL | 1 refills | Status: DC

## 2020-06-14 MED ORDER — LEVOTHYROXINE SODIUM 25 MCG PO TABS: ORAL_TABLET | ORAL | 1 refills | Status: DC

## 2020-06-14 NOTE — Progress Notes (Signed)
Date:  06/14/2020   Name:  Patricia Lawson   DOB:  September 09, 1946   MRN:  [id number]   Chief Complaint: Diabetes (Follow up. Rfs. A1C. PPV23) and Hypothyroidism (Rfs. TSH check. )  Diabetes She presents for her follow-up diabetic visit. She has type 2 diabetes mellitus. Her disease course has been stable. There are no hypoglycemic associated symptoms. Pertinent negatives for hypoglycemia include no confusion, dizziness, headaches, hunger, mood changes, nervousness/anxiousness, pallor, seizures, sleepiness, speech difficulty, sweats or tremors. There are no diabetic associated symptoms. Pertinent negatives for diabetes include no blurred vision, no chest pain, no fatigue, no foot paresthesias, no foot ulcerations, no polydipsia, no polyphagia, no polyuria, no visual change, no weakness and no weight loss. There are no hypoglycemic complications. Symptoms are stable. There are no diabetic complications. Risk factors for coronary artery disease include diabetes mellitus, dyslipidemia and hypertension. Current diabetic treatment includes oral agent (dual therapy). She is compliant with treatment some of the time. Her weight is stable. She is following a generally healthy diet. She participates in exercise intermittently. Her breakfast blood glucose is taken between 8-9 am. Her breakfast blood glucose range is generally 90-110 mg/dl. An ACE inhibitor/angiotensin II receptor blocker is being taken.  Thyroid Problem Presents for follow-up visit. Patient reports no anxiety, cold intolerance, constipation, depressed mood, diaphoresis, diarrhea, dry skin, fatigue, hair loss, heat intolerance, hoarse voice, leg swelling, menstrual problem, nail problem, palpitations, tremors, visual change, weight gain or weight loss. The symptoms have been stable.    Lab Results  Component Value Date   CREATININE 1.05 (H) 12/16/2019   BUN 22 12/16/2019   NA 140 12/16/2019   K 5.8 (H) 12/16/2019   CL 104 12/16/2019   CO2  21 12/16/2019   Lab Results  Component Value Date   CHOL 158 06/12/2019   HDL 50 06/12/2019   LDLCALC 72 06/12/2019   TRIG 179 (H) 06/12/2019   CHOLHDL 4.9 06/21/2018   Lab Results  Component Value Date   TSH 4.050 12/16/2019   Lab Results  Component Value Date   HGBA1C 7.0 (H) 12/16/2019   Lab Results  Component Value Date   WBC 12.2 (H) 06/20/2018   HGB 14.1 06/20/2018   HCT 40.2 06/20/2018   MCV 89.3 06/20/2018   PLT 213 06/20/2018   Lab Results  Component Value Date   ALT 14 06/12/2019   AST 18 06/12/2019   ALKPHOS 113 06/12/2019   BILITOT 0.5 06/12/2019     Review of Systems  Constitutional: Negative.  Negative for chills, diaphoresis, fatigue, fever, unexpected weight change, weight gain and weight loss.  HENT: Negative for congestion, ear discharge, ear pain, hoarse voice, rhinorrhea, sinus pressure, sneezing and sore throat.   Eyes: Negative for blurred vision, photophobia, pain, discharge, redness and itching.  Respiratory: Negative for cough, shortness of breath, wheezing and stridor.   Cardiovascular: Negative for chest pain and palpitations.  Gastrointestinal: Negative for abdominal pain, blood in stool, constipation, diarrhea, nausea and vomiting.  Endocrine: Negative for cold intolerance, heat intolerance, polydipsia, polyphagia and polyuria.  Genitourinary: Negative for dysuria, flank pain, frequency, hematuria, menstrual problem, pelvic pain, urgency, vaginal bleeding and vaginal discharge.  Musculoskeletal: Negative for arthralgias, back pain and myalgias.  Skin: Negative for pallor and rash.  Allergic/Immunologic: Negative for environmental allergies and food allergies.  Neurological: Negative for dizziness, tremors, seizures, speech difficulty, weakness, light-headedness, numbness and headaches.  Hematological: Negative for adenopathy. Does not bruise/bleed easily.  Psychiatric/Behavioral: Negative for confusion and dysphoric mood.  The patient is  not nervous/anxious.     Patient Active Problem List   Diagnosis Date Noted  . Primary hyperparathyroidism ([place]) 03/02/2020  . Type 2 diabetes mellitus without complication, without long-term current use of insulin ([place]) 12/16/2019  . Hypothyroidism 12/16/2019  . Hyperlipidemia 03/31/2019  . Myocardial infarction ([place]) 03/31/2019  . Type 2 diabetes mellitus with hyperglycemia, without long-term current use of insulin ([place]) 02/10/2019  . Painful orthopaedic hardware ([place]) 12/11/2018  . History of patellar fracture 11/25/2018  . 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 Fall 06/21/2018  . RUE weakness 06/20/2018  . Facial laceration 06/20/2018  . HTN (hypertension) 06/20/2018  . CAD (coronary artery disease) 06/20/2018  . UTI (urinary tract infection) 06/20/2018  . Patella fracture 04/05/2018  . Appendicitis, acute 03/04/2015    No Known Allergies  Past Surgical History:  Procedure Laterality Date  . CARDIAC CATHETERIZATION  2010   5 stents placed  . CATARACT EXTRACTION W/ INTRAOCULAR LENS  IMPLANT, BILATERAL    . HARDWARE REMOVAL Right 12/11/2018   Procedure: SCREW REMOVAL FROM PATELLA;  Surgeon: Corky Mull, MD;  Location: [facility];  Service: Orthopedics;  Laterality: Right;  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  . LAPAROSCOPIC APPENDECTOMY N/A 03/04/2015   Procedure: APPENDECTOMY LAPAROSCOPIC;  Surgeon: Sherri Rad, MD;  Location: [facility];  Service: General;  Laterality: N/A;  . ORIF PATELLA Right 04/06/2018   Procedure: OPEN REDUCTION INTERNAL (ORIF) FIXATION PATELLA;  Surgeon: Leim Fabry, MD;  Location: [facility];  Service: Orthopedics;  Laterality: Right;  . STENT PLACE LEFT URETER ([place] HX)     cardiac  . TENDON EXPLORATION Right 12/11/2018   Procedure: EXPLORATION OF PERONEAL NERVE LATERAL KNEE;  Surgeon: Corky Mull, MD;  Location: [facility];  Service: Orthopedics;  Laterality: Right;  Diabetic - oral meds  . THYROIDECTOMY Bilateral     Social  History   Tobacco Use  . Smoking status: Former Smoker    Types: Cigarettes    Quit date: 03/03/2009    Years since quitting: 11.2  . Smokeless tobacco: Never Used  Vaping Use  . Vaping Use: Never used  Substance Use Topics  . Alcohol use: No  . Drug use: Never     Medication list has been reviewed and updated.  Current Meds  Medication Sig  . aspirin EC 81 MG EC tablet Take 1 tablet (81 mg total) by mouth daily.  Marland Kitchen atorvastatin (LIPITOR) 80 MG tablet Take 1 tablet (80 mg total) by mouth daily. Dr Clayborn Bigness  . Cholecalciferol 50 MCG (2000 UT) CAPS Take 1 capsule by mouth daily.  . irbesartan (AVAPRO) 75 MG tablet Take 75 mg by mouth daily. Dr Clayborn Bigness  . levothyroxine (SYNTHROID) 25 MCG tablet One a day  . metFORMIN (GLUCOPHAGE-XR) 500 MG 24 hr tablet Take 1 tablet (500 mg total) by mouth daily with breakfast.  . metoprolol succinate (TOPROL-XL) 100 MG 24 hr tablet Take 100 mg by mouth daily. Dr Clayborn Bigness    [facility] 2/9 Scores 06/14/2020 04/14/2020 06/12/2019 03/31/2019  PHQ - 2 Score 0 0 0 2  PHQ- 9 Score 0 - 0 2    GAD 7 : Generalized Anxiety Score 06/14/2020  Nervous, Anxious, on Edge 0  Control/stop worrying 0  Worry too much - different things 0  Trouble relaxing 0  Restless 0  Easily annoyed or irritable 0  Afraid - awful might happen 0  Total GAD 7 Score 0  Anxiety Difficulty Not difficult at all  BP Readings from Last 3 Encounters:  06/14/20 120/70  12/16/19 122/70  06/12/19 138/70    Physical Exam Vitals and nursing note reviewed.  Constitutional:      Appearance: She is well-developed. She is obese.  HENT:     Head: Normocephalic.     Right Ear: Tympanic membrane, ear canal and external ear normal.     Left Ear: Tympanic membrane, ear canal and external ear normal.     Nose: Nose normal.     Mouth/Throat:     Mouth: Mucous membranes are moist.  Eyes:     General: Lids are everted, no foreign bodies appreciated. No scleral icterus.       Left eye: No  foreign body or hordeolum.     Conjunctiva/sclera: Conjunctivae normal.     Right eye: Right conjunctiva is not injected.     Left eye: Left conjunctiva is not injected.     Pupils: Pupils are equal, round, and reactive to light.  Neck:     Thyroid: No thyromegaly.     Vascular: No JVD.     Trachea: No tracheal deviation.  Cardiovascular:     Rate and Rhythm: Normal rate and regular rhythm.     Pulses:          Dorsalis pedis pulses are 2+ on the right side and 2+ on the left side.       Posterior tibial pulses are 2+ on the right side and 2+ on the left side.     Heart sounds: Normal heart sounds. No murmur heard.  No friction rub. No gallop.   Pulmonary:     Effort: Pulmonary effort is normal. No respiratory distress.     Breath sounds: Normal breath sounds. No wheezing, rhonchi or rales.  Abdominal:     General: Bowel sounds are normal.     Palpations: Abdomen is soft. There is no mass.     Tenderness: There is no abdominal tenderness. There is no guarding or rebound.  Musculoskeletal:        General: No tenderness. Normal range of motion.     Cervical back: Normal range of motion and neck supple.  Feet:     Right foot:     Skin integrity: Skin integrity normal. No ulcer, blister, skin breakdown, erythema, warmth, callus, dry skin or fissure.     Toenail Condition: Right toenails are normal.     Left foot:     Skin integrity: Skin integrity normal. No ulcer, blister, skin breakdown, erythema, warmth, callus, dry skin or fissure.     Toenail Condition: Left toenails are normal.  Lymphadenopathy:     Cervical: No cervical adenopathy.  Skin:    General: Skin is warm.     Findings: No rash.  Neurological:     Mental Status: She is alert and oriented to person, place, and time.     Cranial Nerves: No cranial nerve deficit.     Deep Tendon Reflexes: Reflexes normal.  Psychiatric:        Mood and Affect: Mood is not anxious or depressed.     Wt Readings from Last 3  Encounters:  06/14/20 152 lb (68.9 kg)  12/16/19 145 lb 3.2 oz (65.9 kg)  06/12/19 132 lb (59.9 kg)    BP 120/70   Pulse 65   Ht 5\' 1"  (1.549 m)   Wt 152 lb (68.9 kg)   SpO2 98%   BMI 28.72 kg/m   Assessment and Plan:  1. Hypothyroidism,  unspecified type Chronic.  Controlled.  Stable.  Continue levothyroxine 25 mg once a day.  Will check thyroid panel with TSH. - levothyroxine (SYNTHROID) 25 MCG tablet; One a day  Dispense: 90 tablet; Refill: 1 - Thyroid Panel With TSH  2. Type 2 diabetes mellitus without complication, without long-term current use of insulin (HCC) Chronic.  Controlled.  Stable.  Continue Glucophage Exar 500 mg once a day.  Will check A1c as well as renal function panel.  To look at current level of control. - Hemoglobin A1c - metFORMIN (GLUCOPHAGE-XR) 500 MG 24 hr tablet; Take 1 tablet (500 mg total) by mouth daily with breakfast.  Dispense: 90 tablet; Refill: 1 - Renal Function Panel  3. Primary hyperparathyroidism (HCC) Chronic.  Uncontrolled.  Anticipating upcoming surgery at [facility].  Patient would like to recheck to see if there is any progression since her last exam of her calcium and phosphorus levels. - Renal Function Panel

## 2020-06-15 LAB — T3, FREE: T3, Free: 2.5 pg/mL (ref 2.0–4.4)

## 2020-07-02 DIAGNOSIS — Z01818 Encounter for other preprocedural examination: Secondary | ICD-10-CM | POA: Diagnosis not present

## 2020-07-02 DIAGNOSIS — I10 Essential (primary) hypertension: Secondary | ICD-10-CM | POA: Diagnosis not present

## 2020-07-02 DIAGNOSIS — Z955 Presence of coronary angioplasty implant and graft: Secondary | ICD-10-CM | POA: Diagnosis not present

## 2020-07-02 DIAGNOSIS — I2510 Atherosclerotic heart disease of native coronary artery without angina pectoris: Secondary | ICD-10-CM | POA: Diagnosis not present

## 2020-07-02 DIAGNOSIS — E039 Hypothyroidism, unspecified: Secondary | ICD-10-CM | POA: Diagnosis not present

## 2020-07-02 DIAGNOSIS — I252 Old myocardial infarction: Secondary | ICD-10-CM | POA: Diagnosis not present

## 2020-07-02 DIAGNOSIS — Z8673 Personal history of transient ischemic attack (TIA), and cerebral infarction without residual deficits: Secondary | ICD-10-CM | POA: Diagnosis not present

## 2020-07-02 DIAGNOSIS — E119 Type 2 diabetes mellitus without complications: Secondary | ICD-10-CM | POA: Diagnosis not present

## 2020-07-07 DIAGNOSIS — E210 Primary hyperparathyroidism: Secondary | ICD-10-CM | POA: Diagnosis not present

## 2020-07-09 DIAGNOSIS — E210 Primary hyperparathyroidism: Secondary | ICD-10-CM | POA: Diagnosis not present

## 2020-07-09 DIAGNOSIS — I252 Old myocardial infarction: Secondary | ICD-10-CM | POA: Diagnosis not present

## 2020-07-09 DIAGNOSIS — Z955 Presence of coronary angioplasty implant and graft: Secondary | ICD-10-CM | POA: Diagnosis not present

## 2020-07-09 DIAGNOSIS — E119 Type 2 diabetes mellitus without complications: Secondary | ICD-10-CM | POA: Diagnosis not present

## 2020-07-09 DIAGNOSIS — Z79899 Other long term (current) drug therapy: Secondary | ICD-10-CM | POA: Diagnosis not present

## 2020-07-09 DIAGNOSIS — Z87891 Personal history of nicotine dependence: Secondary | ICD-10-CM | POA: Diagnosis not present

## 2020-07-09 DIAGNOSIS — D351 Benign neoplasm of parathyroid gland: Secondary | ICD-10-CM | POA: Diagnosis not present

## 2020-07-09 DIAGNOSIS — Z8673 Personal history of transient ischemic attack (TIA), and cerebral infarction without residual deficits: Secondary | ICD-10-CM | POA: Diagnosis not present

## 2020-07-09 DIAGNOSIS — Z7984 Long term (current) use of oral hypoglycemic drugs: Secondary | ICD-10-CM | POA: Diagnosis not present

## 2020-07-09 DIAGNOSIS — M810 Age-related osteoporosis without current pathological fracture: Secondary | ICD-10-CM | POA: Diagnosis not present

## 2020-07-09 DIAGNOSIS — I10 Essential (primary) hypertension: Secondary | ICD-10-CM | POA: Diagnosis not present

## 2020-07-09 DIAGNOSIS — E785 Hyperlipidemia, unspecified: Secondary | ICD-10-CM | POA: Diagnosis not present

## 2020-07-09 DIAGNOSIS — I2510 Atherosclerotic heart disease of native coronary artery without angina pectoris: Secondary | ICD-10-CM | POA: Diagnosis not present

## 2020-07-27 DIAGNOSIS — E210 Primary hyperparathyroidism: Secondary | ICD-10-CM | POA: Diagnosis not present

## 2020-07-28 ENCOUNTER — Other Ambulatory Visit: Payer: Medicare Other

## 2020-07-28 ENCOUNTER — Other Ambulatory Visit: Payer: Self-pay

## 2020-07-28 DIAGNOSIS — E119 Type 2 diabetes mellitus without complications: Secondary | ICD-10-CM | POA: Diagnosis not present

## 2020-07-28 NOTE — Progress Notes (Signed)
a1c recheck .

## 2020-07-29 LAB — HEMOGLOBIN A1C
Est. average glucose Bld gHb Est-mCnc: 157 mg/dL
Hgb A1c MFr Bld: 7.1 % — ABNORMAL HIGH (ref 4.8–5.6)

## 2020-08-09 ENCOUNTER — Telehealth: Payer: Self-pay | Admitting: Family Medicine

## 2020-08-09 ENCOUNTER — Other Ambulatory Visit: Payer: Self-pay

## 2020-08-09 DIAGNOSIS — E119 Type 2 diabetes mellitus without complications: Secondary | ICD-10-CM

## 2020-08-09 MED ORDER — METFORMIN HCL ER 750 MG PO TB24: 750.0000 mg | ORAL_TABLET | Freq: Every day | ORAL | 0 refills | Status: DC

## 2020-08-09 NOTE — Telephone Encounter (Signed)
Sent in 750mg  to [facility]

## 2020-08-09 NOTE — Progress Notes (Unsigned)
Sent in Metformin ?

## 2020-08-09 NOTE — Telephone Encounter (Signed)
Patient is calling to request if Dr. Ronnald Ramp can send in script for metFORMIN (GLUCOPHAGE-XR) up to 750MG . Please advise CB- [phone] Preferred [place]

## 2020-09-13 DIAGNOSIS — Z23 Encounter for immunization: Secondary | ICD-10-CM | POA: Diagnosis not present

## 2020-11-06 ENCOUNTER — Other Ambulatory Visit: Payer: Self-pay | Admitting: Family Medicine

## 2020-11-06 DIAGNOSIS — E119 Type 2 diabetes mellitus without complications: Secondary | ICD-10-CM

## 2020-12-15 ENCOUNTER — Other Ambulatory Visit: Payer: Self-pay

## 2020-12-15 ENCOUNTER — Ambulatory Visit (INDEPENDENT_AMBULATORY_CARE_PROVIDER_SITE_OTHER): Payer: Medicare Other | Admitting: Family Medicine

## 2020-12-15 ENCOUNTER — Encounter: Payer: Self-pay | Admitting: Family Medicine

## 2020-12-15 VITALS — BP 112/70 | HR 60 | Ht 61.0 in | Wt 150.0 lb

## 2020-12-15 DIAGNOSIS — E119 Type 2 diabetes mellitus without complications: Secondary | ICD-10-CM

## 2020-12-15 DIAGNOSIS — E039 Hypothyroidism, unspecified: Secondary | ICD-10-CM

## 2020-12-15 MED ORDER — METFORMIN HCL ER 750 MG PO TB24: ORAL_TABLET | ORAL | 1 refills | Status: DC

## 2020-12-15 MED ORDER — LEVOTHYROXINE SODIUM 25 MCG PO TABS: ORAL_TABLET | ORAL | 1 refills | Status: DC

## 2020-12-15 NOTE — Progress Notes (Signed)
Date:  12/15/2020   Name:  Patricia Lawson   DOB:  1946/05/26   MRN:  [id number]   Chief Complaint: Diabetes and Hypothyroidism  Diabetes She presents for her follow-up diabetic visit. She has type 2 diabetes mellitus. Her disease course has been stable. There are no hypoglycemic associated symptoms. Pertinent negatives for hypoglycemia include no dizziness, headaches or nervousness/anxiousness. There are no diabetic associated symptoms. Pertinent negatives for diabetes include no blurred vision, no chest pain, no fatigue, no foot paresthesias, no foot ulcerations, no polydipsia, no polyphagia, no polyuria, no visual change, no weakness and no weight loss. There are no hypoglycemic complications. Symptoms are stable. There are no diabetic complications. Risk factors for coronary artery disease include diabetes mellitus. Current diabetic treatment includes oral agent (monotherapy). Her weight is increasing rapidly. She is following a generally healthy diet. Meal planning includes avoidance of concentrated sweets and carbohydrate counting. She participates in exercise weekly. Her home blood glucose trend is fluctuating minimally. Her breakfast blood glucose is taken between 8-9 am. Her breakfast blood glucose range is generally 130-140 mg/dl. An ACE inhibitor/angiotensin II receptor blocker is being taken. Eye exam is current.  Thyroid Problem Presents for follow-up visit. Patient reports no anxiety, cold intolerance, constipation, diarrhea, fatigue, hair loss, heat intolerance, menstrual problem, palpitations, visual change, weight gain or weight loss.    Lab Results  Component Value Date   CREATININE 0.90 06/14/2020   BUN 17 06/14/2020   NA 137 06/14/2020   K 4.6 06/14/2020   CL 107 06/14/2020   CO2 23 06/14/2020   Lab Results  Component Value Date   CHOL 158 06/12/2019   HDL 50 06/12/2019   LDLCALC 72 06/12/2019   TRIG 179 (H) 06/12/2019   CHOLHDL 4.9 06/21/2018   Lab Results   Component Value Date   TSH 3.567 06/14/2020   Lab Results  Component Value Date   HGBA1C 7.1 (H) 07/28/2020   Lab Results  Component Value Date   WBC 12.2 (H) 06/20/2018   HGB 14.1 06/20/2018   HCT 40.2 06/20/2018   MCV 89.3 06/20/2018   PLT 213 06/20/2018   Lab Results  Component Value Date   ALT 14 06/12/2019   AST 18 06/12/2019   ALKPHOS 113 06/12/2019   BILITOT 0.5 06/12/2019     Review of Systems  Constitutional: Negative.  Negative for chills, fatigue, fever, unexpected weight change, weight gain and weight loss.  HENT: Negative for congestion, ear discharge, ear pain, rhinorrhea, sinus pressure, sneezing and sore throat.   Eyes: Negative for blurred vision, double vision, photophobia, pain, discharge, redness and itching.  Respiratory: Negative for cough, shortness of breath, wheezing and stridor.   Cardiovascular: Negative for chest pain and palpitations.  Gastrointestinal: Negative for abdominal pain, blood in stool, constipation, diarrhea, nausea and vomiting.  Endocrine: Negative for cold intolerance, heat intolerance, polydipsia, polyphagia and polyuria.  Genitourinary: Negative for dysuria, flank pain, frequency, hematuria, menstrual problem, pelvic pain, urgency, vaginal bleeding and vaginal discharge.  Musculoskeletal: Negative for arthralgias, back pain and myalgias.  Skin: Negative for rash.  Allergic/Immunologic: Negative for environmental allergies and food allergies.  Neurological: Negative for dizziness, weakness, light-headedness, numbness and headaches.  Hematological: Negative for adenopathy. Does not bruise/bleed easily.  Psychiatric/Behavioral: Negative for dysphoric mood. The patient is not nervous/anxious.     Patient Active Problem List   Diagnosis Date Noted  . Primary hyperparathyroidism ([place]) 03/02/2020  . Type 2 diabetes mellitus without complication, without long-term current use of insulin (  [place]) 12/16/2019  . Hypothyroidism 12/16/2019   . Hyperlipidemia 03/31/2019  . Myocardial infarction ([place]) 03/31/2019  . Type 2 diabetes mellitus with hyperglycemia, without long-term current use of insulin ([place]) 02/10/2019  . Painful orthopaedic hardware ([place]) 12/11/2018  . History of patellar fracture 11/25/2018  . 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 Fall 06/21/2018  . RUE weakness 06/20/2018  . Facial laceration 06/20/2018  . HTN (hypertension) 06/20/2018  . CAD (coronary artery disease) 06/20/2018  . UTI (urinary tract infection) 06/20/2018  . Patella fracture 04/05/2018  . Appendicitis, acute 03/04/2015    No Known Allergies  Past Surgical History:  Procedure Laterality Date  . CARDIAC CATHETERIZATION  2010   5 stents placed  . CATARACT EXTRACTION W/ INTRAOCULAR LENS  IMPLANT, BILATERAL    . HARDWARE REMOVAL Right 12/11/2018   Procedure: SCREW REMOVAL FROM PATELLA;  Surgeon: Corky Mull, MD;  Location: [place];  Service: Orthopedics;  Laterality: Right;  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  . LAPAROSCOPIC APPENDECTOMY N/A 03/04/2015   Procedure: APPENDECTOMY LAPAROSCOPIC;  Surgeon: Sherri Rad, MD;  Location: [facility];  Service: General;  Laterality: N/A;  . ORIF PATELLA Right 04/06/2018   Procedure: OPEN REDUCTION INTERNAL (ORIF) FIXATION PATELLA;  Surgeon: Leim Fabry, MD;  Location: [facility];  Service: Orthopedics;  Laterality: Right;  . STENT PLACE LEFT URETER ([place] HX)     cardiac  . TENDON EXPLORATION Right 12/11/2018   Procedure: EXPLORATION OF PERONEAL NERVE LATERAL KNEE;  Surgeon: Corky Mull, MD;  Location: [place];  Service: Orthopedics;  Laterality: Right;  Diabetic - oral meds  . THYROIDECTOMY Bilateral     Social History   Tobacco Use  . Smoking status: Former Smoker    Types: Cigarettes    Quit date: 03/03/2009    Years since quitting: 11.7  . Smokeless tobacco: Never Used  Vaping Use  . Vaping Use: Never used  Substance Use Topics  . Alcohol use: No  . Drug use: Never      Medication list has been reviewed and updated.  Current Meds  Medication Sig  . aspirin EC 81 MG EC tablet Take 1 tablet (81 mg total) by mouth daily.  Marland Kitchen atorvastatin (LIPITOR) 80 MG tablet Take 1 tablet (80 mg total) by mouth daily. Dr Clayborn Bigness  . Cholecalciferol 50 MCG (2000 UT) CAPS Take 1 capsule by mouth daily.  . irbesartan (AVAPRO) 75 MG tablet Take 75 mg by mouth daily. Dr Clayborn Bigness  . levothyroxine (SYNTHROID) 25 MCG tablet One a day  . metFORMIN (GLUCOPHAGE-XR) 750 MG 24 hr tablet Take 1 tablet by mouth once daily with breakfast  . metoprolol succinate (TOPROL-XL) 100 MG 24 hr tablet Take 100 mg by mouth daily. Dr Clayborn Bigness    [facility] 2/9 Scores 12/15/2020 06/14/2020 04/14/2020 06/12/2019  PHQ - 2 Score 0 0 0 0  PHQ- 9 Score 0 0 - 0    GAD 7 : Generalized Anxiety Score 12/15/2020 06/14/2020  Nervous, Anxious, on Edge 0 0  Control/stop worrying 0 0  Worry too much - different things 0 0  Trouble relaxing 0 0  Restless 0 0  Easily annoyed or irritable 0 0  Afraid - awful might happen 0 0  Total GAD 7 Score 0 0  Anxiety Difficulty - Not difficult at all    BP Readings from Last 3 Encounters:  12/15/20 112/70  06/14/20 120/70  12/16/19 122/70    Physical Exam Vitals and nursing note reviewed.  Constitutional:  Appearance: She is well-developed and well-nourished.  HENT:     Head: Normocephalic.     Right Ear: External ear normal.     Left Ear: External ear normal.     Mouth/Throat:     Mouth: Oropharynx is clear and moist.  Eyes:     General: Lids are everted, no foreign bodies appreciated. No scleral icterus.       Left eye: No foreign body or hordeolum.     Extraocular Movements: EOM normal.     Conjunctiva/sclera: Conjunctivae normal.     Right eye: Right conjunctiva is not injected.     Left eye: Left conjunctiva is not injected.     Pupils: Pupils are equal, round, and reactive to light.  Neck:     Thyroid: No thyromegaly.     Vascular: No JVD.      Trachea: No tracheal deviation.  Cardiovascular:     Rate and Rhythm: Normal rate and regular rhythm.     Pulses: Intact distal pulses.          Dorsalis pedis pulses are 2+ on the right side and 2+ on the left side.       Posterior tibial pulses are 2+ on the right side and 2+ on the left side.     Heart sounds: Normal heart sounds and S1 normal. No murmur heard.  No systolic murmur is present.  No diastolic murmur is present. No friction rub. No gallop. No S3 or S4 sounds.   Pulmonary:     Effort: Pulmonary effort is normal. No respiratory distress.     Breath sounds: Normal breath sounds. No wheezing or rales.  Abdominal:     General: Bowel sounds are normal.     Palpations: Abdomen is soft. There is no hepatosplenomegaly or mass.     Tenderness: There is no abdominal tenderness. There is no guarding or rebound.  Musculoskeletal:        General: No tenderness or edema. Normal range of motion.     Cervical back: Normal range of motion and neck supple.     Right foot: Normal range of motion.  Feet:     Right foot:     Protective Sensation: 10 sites tested. 10 sites sensed.     Skin integrity: No ulcer, blister, skin breakdown, erythema, warmth, callus, dry skin or fissure.     Toenail Condition: Right toenails are normal.     Left foot:     Protective Sensation: 10 sites tested. 10 sites sensed.     Skin integrity: No ulcer, blister, skin breakdown, erythema, warmth, callus, dry skin or fissure.     Toenail Condition: Left toenails are normal.  Lymphadenopathy:     Cervical: No cervical adenopathy.  Skin:    General: Skin is warm.     Findings: No rash.  Neurological:     Mental Status: She is alert and oriented to person, place, and time.     Cranial Nerves: No cranial nerve deficit.     Deep Tendon Reflexes: Strength normal. Reflexes normal.  Psychiatric:        Mood and Affect: Mood and affect normal. Mood is not anxious or depressed.     Wt Readings from Last 3  Encounters:  12/15/20 150 lb (68 kg)  06/14/20 152 lb (68.9 kg)  12/16/19 145 lb 3.2 oz (65.9 kg)    BP 112/70   Pulse 60   Ht 5\' 1"  (1.549 m)   Wt 150 lb (68  kg)   BMI 28.34 kg/m   Assessment and Plan:  1. Hypothyroidism, unspecified type Chronic.  Controlled.  Stable.  We will check current status of control with thyroid panel with TSH and likely will continue levothyroxine 25 mcg daily. - levothyroxine (SYNTHROID) 25 MCG tablet; One a day  Dispense: 90 tablet; Refill: 1 - Thyroid Panel With TSH  2. Type 2 diabetes mellitus without complication, without long-term current use of insulin (HCC) Chronic.  Controlled.  Stable.  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  This is tolerated well without diarrhea. - metFORMIN (GLUCOPHAGE-XR) 750 MG 24 hr tablet; Take 1 tablet by mouth once daily with breakfast  Dispense: 90 tablet; Refill: 1 - HgB A1c - Microalbumin, urine

## 2020-12-16 ENCOUNTER — Other Ambulatory Visit: Payer: Self-pay

## 2020-12-16 DIAGNOSIS — E039 Hypothyroidism, unspecified: Secondary | ICD-10-CM

## 2020-12-16 LAB — HEMOGLOBIN A1C
Est. average glucose Bld gHb Est-mCnc: 157 mg/dL
Hgb A1c MFr Bld: 7.1 % — ABNORMAL HIGH (ref 4.8–5.6)

## 2020-12-16 LAB — MICROALBUMIN, URINE: Microalbumin, Urine: 63.7 ug/mL

## 2020-12-16 LAB — THYROID PANEL WITH TSH
Free Thyroxine Index: 1.9 (ref 1.2–4.9)
T3 Uptake Ratio: 23 % — ABNORMAL LOW (ref 24–39)
T4, Total: 8.4 ug/dL (ref 4.5–12.0)
TSH: 5.91 u[IU]/mL — ABNORMAL HIGH (ref 0.450–4.500)

## 2020-12-16 MED ORDER — LEVOTHYROXINE SODIUM 50 MCG PO TABS: ORAL_TABLET | ORAL | 1 refills | Status: DC

## 2021-01-09 ENCOUNTER — Other Ambulatory Visit: Payer: Self-pay | Admitting: Family Medicine

## 2021-01-09 DIAGNOSIS — E119 Type 2 diabetes mellitus without complications: Secondary | ICD-10-CM

## 2021-01-12 ENCOUNTER — Other Ambulatory Visit: Payer: Self-pay | Admitting: Family Medicine

## 2021-01-12 DIAGNOSIS — E119 Type 2 diabetes mellitus without complications: Secondary | ICD-10-CM

## 2021-01-12 MED ORDER — METFORMIN HCL ER 750 MG PO TB24: ORAL_TABLET | ORAL | 0 refills | Status: DC

## 2021-01-12 NOTE — Telephone Encounter (Signed)
Medication Refill - Medication:   metFORMIN (GLUCOPHAGE-XR) 750 MG 24 hr tablet    Has the patient contacted their pharmacy? Yes.   (Agent: If no, request that the patient contact the pharmacy for the refill.) (Agent: If yes, when and what did the pharmacy advise?)  Preferred Pharmacy (with phone number or street name):   [place], [place] - [place]  [place] [address]  Phone: [phone] Fax: [phone]     Agent: Please be advised that RX refills may take up to 3 business days. We ask that you follow-up with your pharmacy.

## 2021-01-12 NOTE — Telephone Encounter (Signed)
Called pt, did not receive refill of 12/15/20 #90  1 refill. Receipt not confirmed by pharmacy. LAst refill pt received 11/07/20 #60.

## 2021-02-14 ENCOUNTER — Other Ambulatory Visit: Payer: Medicare Other

## 2021-02-14 DIAGNOSIS — E039 Hypothyroidism, unspecified: Secondary | ICD-10-CM

## 2021-02-15 ENCOUNTER — Other Ambulatory Visit: Payer: Self-pay

## 2021-02-15 DIAGNOSIS — E039 Hypothyroidism, unspecified: Secondary | ICD-10-CM

## 2021-02-15 LAB — TSH: TSH: 1.97 u[IU]/mL (ref 0.450–4.500)

## 2021-02-15 MED ORDER — LEVOTHYROXINE SODIUM 50 MCG PO TABS: ORAL_TABLET | ORAL | 0 refills | Status: DC

## 2021-04-01 ENCOUNTER — Other Ambulatory Visit: Payer: Self-pay

## 2021-04-01 ENCOUNTER — Ambulatory Visit (INDEPENDENT_AMBULATORY_CARE_PROVIDER_SITE_OTHER): Payer: Medicare Other | Admitting: Family Medicine

## 2021-04-01 ENCOUNTER — Encounter: Payer: Self-pay | Admitting: Family Medicine

## 2021-04-01 VITALS — BP 128/66 | HR 60 | Ht 61.0 in | Wt 146.0 lb

## 2021-04-01 DIAGNOSIS — E119 Type 2 diabetes mellitus without complications: Secondary | ICD-10-CM

## 2021-04-01 DIAGNOSIS — Z1231 Encounter for screening mammogram for malignant neoplasm of breast: Secondary | ICD-10-CM | POA: Diagnosis not present

## 2021-04-01 MED ORDER — METFORMIN HCL ER 750 MG PO TB24: ORAL_TABLET | ORAL | 1 refills | Status: DC

## 2021-04-01 NOTE — Progress Notes (Signed)
Date:  04/01/2021   Name:  Patricia Lawson   DOB:  July 30, 1946   MRN:  [id number]   Chief Complaint: Diabetes and breast exam  Diabetes She presents for her follow-up diabetic visit. She has type 2 diabetes mellitus. Her disease course has been stable. There are no hypoglycemic associated symptoms. Pertinent negatives for hypoglycemia include no dizziness, headaches or nervousness/anxiousness. Pertinent negatives for diabetes include no blurred vision, no chest pain, no fatigue, no foot paresthesias, no foot ulcerations, no polydipsia, no polyphagia, no polyuria, no visual change, no weakness and no weight loss. There are no hypoglycemic complications. Symptoms are stable. There are no diabetic complications. Current diabetic treatment includes oral agent (monotherapy). She is following a generally healthy diet. Meal planning includes avoidance of concentrated sweets and carbohydrate counting. Her breakfast blood glucose is taken between 8-9 am. Her breakfast blood glucose range is generally 110-130 mg/dl. An ACE inhibitor/angiotensin II receptor blocker is not being taken.    Lab Results  Component Value Date   CREATININE 0.90 06/14/2020   BUN 17 06/14/2020   NA 137 06/14/2020   K 4.6 06/14/2020   CL 107 06/14/2020   CO2 23 06/14/2020   Lab Results  Component Value Date   CHOL 158 06/12/2019   HDL 50 06/12/2019   LDLCALC 72 06/12/2019   TRIG 179 (H) 06/12/2019   CHOLHDL 4.9 06/21/2018   Lab Results  Component Value Date   TSH 1.970 02/14/2021   Lab Results  Component Value Date   HGBA1C 7.1 (H) 12/15/2020   Lab Results  Component Value Date   WBC 12.2 (H) 06/20/2018   HGB 14.1 06/20/2018   HCT 40.2 06/20/2018   MCV 89.3 06/20/2018   PLT 213 06/20/2018   Lab Results  Component Value Date   ALT 14 06/12/2019   AST 18 06/12/2019   ALKPHOS 113 06/12/2019   BILITOT 0.5 06/12/2019     Review of Systems  Constitutional: Negative.  Negative for chills, fatigue,  fever, unexpected weight change and weight loss.  HENT: Negative for congestion, ear discharge, ear pain, rhinorrhea, sinus pressure, sneezing and sore throat.   Eyes: Negative for blurred vision, photophobia, pain, discharge, redness and itching.  Respiratory: Negative for cough, shortness of breath, wheezing and stridor.   Cardiovascular: Negative for chest pain.  Gastrointestinal: Negative for abdominal pain, blood in stool, constipation, diarrhea, nausea and vomiting.  Endocrine: Negative for cold intolerance, heat intolerance, polydipsia, polyphagia and polyuria.  Genitourinary: Negative for dysuria, flank pain, frequency, hematuria, menstrual problem, pelvic pain, urgency, vaginal bleeding and vaginal discharge.  Musculoskeletal: Negative for arthralgias, back pain and myalgias.  Skin: Negative for rash.  Allergic/Immunologic: Negative for environmental allergies and food allergies.  Neurological: Negative for dizziness, weakness, light-headedness, numbness and headaches.  Hematological: Negative for adenopathy. Does not bruise/bleed easily.  Psychiatric/Behavioral: Negative for dysphoric mood. The patient is not nervous/anxious.     Patient Active Problem List   Diagnosis Date Noted  . Primary hyperparathyroidism ([place]) 03/02/2020  . Type 2 diabetes mellitus without complication, without long-term current use of insulin ([place]) 12/16/2019  . Hypothyroidism 12/16/2019  . Hyperlipidemia 03/31/2019  . Myocardial infarction ([place]) 03/31/2019  . Type 2 diabetes mellitus with hyperglycemia, without long-term current use of insulin ([place]) 02/10/2019  . Painful orthopaedic hardware ([place]) 12/11/2018  . History of patellar fracture 11/25/2018  . 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 Fall 06/21/2018  . RUE weakness 06/20/2018  . Facial laceration 06/20/2018  . HTN (hypertension)  06/20/2018  . CAD (coronary artery disease) 06/20/2018  . UTI (urinary tract infection) 06/20/2018  . Patella  fracture 04/05/2018  . Appendicitis, acute 03/04/2015    No Known Allergies  Past Surgical History:  Procedure Laterality Date  . CARDIAC CATHETERIZATION  2010   5 stents placed  . CATARACT EXTRACTION W/ INTRAOCULAR LENS  IMPLANT, BILATERAL    . HARDWARE REMOVAL Right 12/11/2018   Procedure: SCREW REMOVAL FROM PATELLA;  Surgeon: Corky Mull, MD;  Location: [facility];  Service: Orthopedics;  Laterality: Right;  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  . LAPAROSCOPIC APPENDECTOMY N/A 03/04/2015   Procedure: APPENDECTOMY LAPAROSCOPIC;  Surgeon: Sherri Rad, MD;  Location: [facility];  Service: General;  Laterality: N/A;  . ORIF PATELLA Right 04/06/2018   Procedure: OPEN REDUCTION INTERNAL (ORIF) FIXATION PATELLA;  Surgeon: Leim Fabry, MD;  Location: [facility];  Service: Orthopedics;  Laterality: Right;  . STENT PLACE LEFT URETER ([place] [facility]X)     cardiac  . TENDON EXPLORATION Right 12/11/2018   Procedure: EXPLORATION OF PERONEAL NERVE LATERAL KNEE;  Surgeon: Corky Mull, MD;  Location: [facility];  Service: Orthopedics;  Laterality: Right;  Diabetic - oral meds  . THYROIDECTOMY Bilateral     Social History   Tobacco Use  . Smoking status: Former Smoker    Types: Cigarettes    Quit date: 03/03/2009    Years since quitting: 12.0  . Smokeless tobacco: Never Used  Vaping Use  . Vaping Use: Never used  Substance Use Topics  . Alcohol use: No  . Drug use: Never     Medication list has been reviewed and updated.  Current Meds  Medication Sig  . aspirin EC 81 MG EC tablet Take 1 tablet (81 mg total) by mouth daily.  Marland Kitchen atorvastatin (LIPITOR) 80 MG tablet Take 1 tablet (80 mg total) by mouth daily. Dr Clayborn Bigness  . Cholecalciferol 50 MCG (2000 UT) CAPS Take 1 capsule by mouth daily.  . irbesartan (AVAPRO) 75 MG tablet Take 75 mg by mouth daily. Dr Clayborn Bigness  . levothyroxine (SYNTHROID) 50 MCG tablet One a day  . metFORMIN (GLUCOPHAGE-XR) 750 MG 24 hr tablet Take 1 tablet by mouth  once daily with breakfast  . metoprolol succinate (TOPROL-XL) 100 MG 24 hr tablet Take 100 mg by mouth daily. Dr Clayborn Bigness    [facility] 2/9 Scores 04/01/2021 12/15/2020 06/14/2020 04/14/2020  PHQ - 2 Score 0 0 0 0  PHQ- 9 Score 0 0 0 -    GAD 7 : Generalized Anxiety Score 04/01/2021 12/15/2020 06/14/2020  Nervous, Anxious, on Edge 0 0 0  Control/stop worrying 0 0 0  Worry too much - different things 0 0 0  Trouble relaxing 0 0 0  Restless 0 0 0  Easily annoyed or irritable 0 0 0  Afraid - awful might happen 0 0 0  Total GAD 7 Score 0 0 0  Anxiety Difficulty - - Not difficult at all    BP Readings from Last 3 Encounters:  04/01/21 128/66  12/15/20 112/70  06/14/20 120/70    Physical Exam Vitals and nursing note reviewed.  Constitutional:      Appearance: She is well-developed.  HENT:     Head: Normocephalic.     Right Ear: External ear normal.     Left Ear: External ear normal.  Eyes:     General: Lids are everted, no foreign bodies appreciated. No scleral icterus.       Left eye: No foreign  body or hordeolum.     Conjunctiva/sclera: Conjunctivae normal.     Right eye: Right conjunctiva is not injected.     Left eye: Left conjunctiva is not injected.     Pupils: Pupils are equal, round, and reactive to light.  Neck:     Thyroid: No thyromegaly.     Vascular: No JVD.     Trachea: No tracheal deviation.  Cardiovascular:     Rate and Rhythm: Normal rate and regular rhythm.     Heart sounds: Normal heart sounds, S1 normal and S2 normal. No murmur heard.  No systolic murmur is present.  No diastolic murmur is present. No friction rub. No gallop. No S3 or S4 sounds.   Pulmonary:     Effort: Pulmonary effort is normal. No respiratory distress.     Breath sounds: Normal breath sounds. No decreased breath sounds, wheezing, rhonchi or rales.  Chest:  Breasts:     Right: Normal. No swelling, bleeding, inverted nipple, mass, nipple discharge, skin change, tenderness, axillary adenopathy  or supraclavicular adenopathy.     Left: Normal. No swelling, bleeding, inverted nipple, mass, nipple discharge, skin change, tenderness, axillary adenopathy or supraclavicular adenopathy.    Abdominal:     General: Bowel sounds are normal.     Palpations: Abdomen is soft. There is no mass.     Tenderness: There is no abdominal tenderness. There is no guarding or rebound.  Musculoskeletal:        General: No tenderness. Normal range of motion.     Cervical back: Normal range of motion and neck supple.  Lymphadenopathy:     Cervical: No cervical adenopathy.     Upper Body:     Right upper body: No supraclavicular, axillary or pectoral adenopathy.     Left upper body: No supraclavicular, axillary or pectoral adenopathy.  Skin:    General: Skin is warm.     Findings: No rash.  Neurological:     Mental Status: She is alert and oriented to person, place, and time.     Cranial Nerves: No cranial nerve deficit.     Deep Tendon Reflexes: Reflexes normal.  Psychiatric:        Mood and Affect: Mood is not anxious or depressed.     Wt Readings from Last 3 Encounters:  04/01/21 146 lb (66.2 kg)  12/15/20 150 lb (68 kg)  06/14/20 152 lb (68.9 kg)    BP 128/66   Pulse 60   Ht 5\' 1"  (1.549 m)   Wt 146 lb (66.2 kg)   BMI 27.59 kg/m   Assessment and Plan:

## 2021-04-02 LAB — HEMOGLOBIN A1C
Est. average glucose Bld gHb Est-mCnc: 143 mg/dL
Hgb A1c MFr Bld: 6.6 % — ABNORMAL HIGH (ref 4.8–5.6)

## 2021-04-11 ENCOUNTER — Other Ambulatory Visit: Payer: Self-pay

## 2021-04-11 ENCOUNTER — Ambulatory Visit
Admission: RE | Admit: 2021-04-11 | Discharge: 2021-04-11 | Disposition: A | Discharge status: Home / Self Care | Payer: Medicare Other | Source: Ambulatory Visit | Attending: Family Medicine | Admitting: Family Medicine

## 2021-04-11 DIAGNOSIS — Z1231 Encounter for screening mammogram for malignant neoplasm of breast: Secondary | ICD-10-CM | POA: Diagnosis not present

## 2021-04-20 ENCOUNTER — Ambulatory Visit (INDEPENDENT_AMBULATORY_CARE_PROVIDER_SITE_OTHER): Payer: Medicare Other

## 2021-04-20 DIAGNOSIS — Z0000 Encounter for general adult medical examination without abnormal findings: Secondary | ICD-10-CM | POA: Diagnosis not present

## 2021-04-20 NOTE — Patient Instructions (Signed)
Patricia Lawson , Thank you for taking time to come for your Medicare Wellness Visit. I appreciate your ongoing commitment to your health goals. Please review the following plan we discussed and let me know if I can assist you in the future.   Screening recommendations/referrals: Colonoscopy: not completed Mammogram: done 04/11/21 Bone Density: done 02/10/20 Recommended yearly ophthalmology/optometry visit for glaucoma screening and checkup Recommended yearly dental visit for hygiene and checkup  Vaccinations: Influenza vaccine: declined Pneumococcal vaccine: done 06/14/20 Tdap vaccine: done 06/20/18 Shingles vaccine: n/a   Covid-19:done 02/02/20, 02/23/20 & 09/13/20  Advanced directives: Advance directive discussed with you today. I have provided a copy for you to complete at home and have notarized. Once this is complete please bring a copy in to our office so we can scan it into your chart.   Conditions/risks identified: Keep up the great work!  Next appointment: Follow up in one year for your annual wellness visit    Preventive Care 65 Years and Older, Female Preventive care refers to lifestyle choices and visits with your health care provider that can promote health and wellness. What does preventive care include? A yearly physical exam. This is also called an annual well check. Dental exams once or twice a year. Routine eye exams. Ask your health care provider how often you should have your eyes checked. Personal lifestyle choices, including: Daily care of your teeth and gums. Regular physical activity. Eating a healthy diet. Avoiding tobacco and drug use. Limiting alcohol use. Practicing safe sex. Taking low-dose aspirin every day. Taking vitamin and mineral supplements as recommended by your health care provider. What happens during an annual well check? The services and screenings done by your health care provider during your annual well check will depend on your age, overall  health, lifestyle risk factors, and family history of disease. Counseling  Your health care provider may ask you questions about your: Alcohol use. Tobacco use. Drug use. Emotional well-being. Home and relationship well-being. Sexual activity. Eating habits. History of falls. Memory and ability to understand (cognition). Work and work Statistician. Reproductive health. Screening  You may have the following tests or measurements: Height, weight, and BMI. Blood pressure. Lipid and cholesterol levels. These may be checked every 5 years, or more frequently if you are over 32 years old. Skin check. Lung cancer screening. You may have this screening every year starting at age 75 if you have a 30-pack-year history of smoking and currently smoke or have quit within the past 15 years. Fecal occult blood test (FOBT) of the stool. You may have this test every year starting at age 40. Flexible sigmoidoscopy or colonoscopy. You may have a sigmoidoscopy every 5 years or a colonoscopy every 10 years starting at age 75. Hepatitis C blood test. Hepatitis B blood test. Sexually transmitted disease (STD) testing. Diabetes screening. This is done by checking your blood sugar (glucose) after you have not eaten for a while (fasting). You may have this done every 1-3 years. Bone density scan. This is done to screen for osteoporosis. You may have this done starting at age 75. Mammogram. This may be done every 1-2 years. Talk to your health care provider about how often you should have regular mammograms. Talk with your health care provider about your test results, treatment options, and if necessary, the need for more tests. Vaccines  Your health care provider may recommend certain vaccines, such as: Influenza vaccine. This is recommended every year. Tetanus, diphtheria, and acellular pertussis (Tdap, Td) vaccine.  You may need a Td booster every 10 years. Zoster vaccine. You may need this after age  75. Pneumococcal 13-valent conjugate (PCV13) vaccine. One dose is recommended after age 75. Pneumococcal polysaccharide (PPSV23) vaccine. One dose is recommended after age 75. Talk to your health care provider about which screenings and vaccines you need and how often you need them. This information is not intended to replace advice given to you by your health care provider. Make sure you discuss any questions you have with your health care provider. Document Released: 11/12/2015 Document Revised: 07/05/2016 Document Reviewed: 08/17/2015 Elsevier Interactive Patient Education  2017 [place] Prevention in the Home Falls can cause injuries. They can happen to people of all ages. There are many things you can do to make your home safe and to help prevent falls. What can I do on the outside of my home? Regularly fix the edges of walkways and driveways and fix any cracks. Remove anything that might make you trip as you walk through a door, such as a raised step or threshold. Trim any bushes or trees on the path to your home. Use bright outdoor lighting. Clear any walking paths of anything that might make someone trip, such as rocks or tools. Regularly check to see if handrails are loose or broken. Make sure that both sides of any steps have handrails. Any raised decks and porches should have guardrails on the edges. Have any leaves, snow, or ice cleared regularly. Use sand or salt on walking paths during winter. Clean up any spills in your garage right away. This includes oil or grease spills. What can I do in the bathroom? Use night lights. Install grab bars by the toilet and in the tub and shower. Do not use towel bars as grab bars. Use non-skid mats or decals in the tub or shower. If you need to sit down in the shower, use a plastic, non-slip stool. Keep the floor dry. Clean up any water that spills on the floor as soon as it happens. Remove soap buildup in the tub or shower  regularly. Attach bath mats securely with double-sided non-slip rug tape. Do not have throw rugs and other things on the floor that can make you trip. What can I do in the bedroom? Use night lights. Make sure that you have a light by your bed that is easy to reach. Do not use any sheets or blankets that are too big for your bed. They should not hang down onto the floor. Have a firm chair that has side arms. You can use this for support while you get dressed. Do not have throw rugs and other things on the floor that can make you trip. What can I do in the kitchen? Clean up any spills right away. Avoid walking on wet floors. Keep items that you use a lot in easy-to-reach places. If you need to reach something above you, use a strong step stool that has a grab bar. Keep electrical cords out of the way. Do not use floor polish or wax that makes floors slippery. If you must use wax, use non-skid floor wax. Do not have throw rugs and other things on the floor that can make you trip. What can I do with my stairs? Do not leave any items on the stairs. Make sure that there are handrails on both sides of the stairs and use them. Fix handrails that are broken or loose. Make sure that handrails are as long as  the stairways. Check any carpeting to make sure that it is firmly attached to the stairs. Fix any carpet that is loose or worn. Avoid having throw rugs at the top or bottom of the stairs. If you do have throw rugs, attach them to the floor with carpet tape. Make sure that you have a light switch at the top of the stairs and the bottom of the stairs. If you do not have them, ask someone to add them for you. What else can I do to help prevent falls? Wear shoes that: Do not have high heels. Have rubber bottoms. Are comfortable and fit you well. Are closed at the toe. Do not wear sandals. If you use a stepladder: Make sure that it is fully opened. Do not climb a closed stepladder. Make sure that  both sides of the stepladder are locked into place. Ask someone to hold it for you, if possible. Clearly mark and make sure that you can see: Any grab bars or handrails. First and last steps. Where the edge of each step is. Use tools that help you move around (mobility aids) if they are needed. These include: Canes. Walkers. Scooters. Crutches. Turn on the lights when you go into a dark area. Replace any light bulbs as soon as they burn out. Set up your furniture so you have a clear path. Avoid moving your furniture around. If any of your floors are uneven, fix them. If there are any pets around you, be aware of where they are. Review your medicines with your doctor. Some medicines can make you feel dizzy. This can increase your chance of falling. Ask your doctor what other things that you can do to help prevent falls. This information is not intended to replace advice given to you by your health care provider. Make sure you discuss any questions you have with your health care provider. Document Released: 08/12/2009 Document Revised: 03/23/2016 Document Reviewed: 11/20/2014 Elsevier Interactive Patient Education  2017 [employer].

## 2021-04-20 NOTE — Progress Notes (Signed)
Subjective:   Patricia Lawson is a 75 y.o. female who presents for Medicare Annual (Subsequent) preventive examination.  Virtual Visit via Telephone Note  I connected with  Patricia Lawson on 04/20/21 at  1:20 PM EDT by telephone and verified that I am speaking with the correct person using two identifiers.  Location: Patient: home Provider: [facility] Persons participating in the virtual visit: [place]   I discussed the limitations, risks, security and privacy concerns of performing an evaluation and management service by telephone and the availability of in person appointments. The patient expressed understanding and agreed to proceed.  Interactive audio and video telecommunications were attempted between this nurse and patient, however failed, due to patient having technical difficulties OR patient did not have access to video capability.  We continued and completed visit with audio only.  Some vital signs may be absent or patient reported.   [name], LPN   Review of Systems     Cardiac Risk Factors include: advanced age (>17men, >45 women);diabetes mellitus;dyslipidemia;hypertension     Objective:    There were no vitals filed for this visit. There is no height or weight on file to calculate BMI.  Advanced Directives 04/20/2021 04/14/2020 03/31/2019 12/11/2018 06/20/2018 06/20/2018 04/05/2018  Does Patient Have a Medical Advance Directive? No No No No No No No  Would patient like information on creating a medical advance directive? Yes (MAU/Ambulatory/Procedural Areas - Information given) Yes (MAU/Ambulatory/Procedural Areas - Information given) Yes (MAU/Ambulatory/Procedural Areas - Information given) No - Patient declined No - Patient declined No - Patient declined No - Patient declined    Current Medications (verified) Outpatient Encounter Medications as of 04/20/2021  Medication Sig   aspirin EC 81 MG EC tablet Take 1 tablet (81 mg total) by mouth daily.    atorvastatin (LIPITOR) 80 MG tablet Take 1 tablet (80 mg total) by mouth daily. Dr Clayborn Bigness   Cholecalciferol 50 MCG (2000 UT) CAPS Take 1 capsule by mouth daily.   irbesartan (AVAPRO) 75 MG tablet Take 75 mg by mouth daily. Dr Clayborn Bigness   levothyroxine (SYNTHROID) 50 MCG tablet One a day   metFORMIN (GLUCOPHAGE-XR) 750 MG 24 hr tablet Take 1 tablet by mouth once daily with breakfast   metoprolol succinate (TOPROL-XL) 100 MG 24 hr tablet Take 100 mg by mouth daily. Dr Clayborn Bigness   No facility-administered encounter medications on file as of 04/20/2021.    Allergies (verified) Patient has no known allergies.   History: Past Medical History:  Diagnosis Date   CHF (congestive heart failure) ([place]) 2010   Coronary artery disease    Diabetes mellitus without complication ([place])    type 2   Hyperlipidemia    Hypertension    Myocardial infarction ([place]) 2010   5 stents placed   Stroke ([place]) 05/2018   no deficits   Past Surgical History:  Procedure Laterality Date   CARDIAC CATHETERIZATION  2010   5 stents placed   CATARACT EXTRACTION W/ INTRAOCULAR LENS  IMPLANT, BILATERAL     HARDWARE REMOVAL Right 12/11/2018   Procedure: SCREW REMOVAL FROM PATELLA;  Surgeon: Corky Mull, MD;  Location: [place];  Service: Orthopedics;  Laterality: Right;  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   LAPAROSCOPIC APPENDECTOMY N/A 03/04/2015   Procedure: APPENDECTOMY LAPAROSCOPIC;  Surgeon: Sherri Rad, MD;  Location: [facility];  Service: General;  Laterality: N/A;   ORIF PATELLA Right 04/06/2018   Procedure: OPEN REDUCTION INTERNAL (ORIF) FIXATION PATELLA;  Surgeon: Leim Fabry, MD;  Location: [facility];  Service: Orthopedics;  Laterality: Right;   STENT PLACE LEFT URETER ([place] HX)     cardiac   TENDON EXPLORATION Right 12/11/2018   Procedure: EXPLORATION OF PERONEAL NERVE LATERAL KNEE;  Surgeon: Corky Mull, MD;  Location: [place];  Service: Orthopedics;  Laterality: Right;  Diabetic - oral  meds   THYROIDECTOMY Bilateral    Family History  Problem Relation Age of Onset   Heart block Mother    Heart disease Mother    Hypertension Mother    Heart block Father    Heart disease Father    Hypertension Father    Breast cancer Neg Hx    Social History   Socioeconomic History   Marital status: Divorced    Spouse name: Not on file   Number of children: 2   Years of education: Not on file   Highest education level: Some college, no degree  Occupational History   Occupation: retired  Tobacco Use   Smoking status: Former    Pack years: 0.00    Types: Cigarettes    Quit date: 03/03/2009    Years since quitting: 12.1   Smokeless tobacco: Never  Vaping Use   Vaping Use: Never used  Substance and Sexual Activity   Alcohol use: No   Drug use: Never   Sexual activity: Not Currently  Other Topics Concern   Not on file  Social History Narrative   Pt lives alone.    Social Determinants of Health   Financial Resource Strain: Low Risk    Difficulty of Paying Living Expenses: Not very hard  Food Insecurity: No Food Insecurity   Worried About Charity fundraiser in the Last Year: Never true   Ran Out of Food in the Last Year: Never true  Transportation Needs: No Transportation Needs   Lack of Transportation (Medical): No   Lack of Transportation (Non-Medical): No  Physical Activity: Insufficiently Active   Days of Exercise per Week: 7 days   Minutes of Exercise per Session: 20 min  Stress: No Stress Concern Present   Feeling of Stress : Not at all  Social Connections: Moderately Isolated   Frequency of Communication with Friends and Family: More than three times a week   Frequency of Social Gatherings with Friends and Family: Three times a week   Attends Religious Services: More than 4 times per year   Active Member of Clubs or Organizations: No   Attends Archivist Meetings: Never   Marital Status: Divorced    Tobacco Counseling Counseling given: Not  Answered   Clinical Intake:  Pre-visit preparation completed: Yes  Pain : No/denies pain     Nutritional Risks: None Diabetes: Yes CBG done?: No Did pt. bring in CBG monitor from home?: No  How often do you need to have someone help you when you read instructions, pamphlets, or other written materials from your doctor or pharmacy?: 1 - Never Nutrition Risk Assessment:  Has the patient had any N/V/D within the last 2 months?  No  Does the patient have any non-healing wounds?  No  Has the patient had any unintentional weight loss or weight gain?  No   Diabetes:  Is the patient diabetic?  Yes  If diabetic, was a CBG obtained today?  No  Did the patient bring in their glucometer from home?  No  How often do you monitor your CBG's? daily.   Financial Strains and Diabetes Management:  Are you having  any financial strains with the device, your supplies or your medication? No .  Does the patient want to be seen by Chronic Care Management for management of their diabetes?  No  Would the patient like to be referred to a Nutritionist or for Diabetic Management?  No   Diabetic Exams:  Diabetic Eye Exam: Overdue for diabetic eye exam. Pt has been advised about the importance in completing this exam.   Diabetic Foot Exam: Completed 12/15/20.     Interpreter Needed?: No  Information entered by :: [name] LPN   Activities of Daily Living In your present state of health, do you have any difficulty performing the following activities: 04/20/2021  Hearing? N  Comment declines hearing aids  Vision? N  Difficulty concentrating or making decisions? N  Walking or climbing stairs? N  Dressing or bathing? N  Doing errands, shopping? N  Preparing Food and eating ? N  Using the Toilet? N  In the past six months, have you accidently leaked urine? N  Do you have problems with loss of bowel control? N  Managing your Medications? N  Managing your Finances? N  Housekeeping or  managing your Housekeeping? N  Some recent data might be hidden    Patient Care Team: Juline Patch, MD as PCP - General (Family Medicine) Yolonda Kida, MD as Consulting Physician (Cardiology) Lonia Farber, MD as Consulting Physician (Internal Medicine)  Indicate any recent Medical Services you may have received from other than [facility] providers in the past year (date may be approximate).     Assessment:   This is a routine wellness examination for Patricia Lawson.  Hearing/Vision screen Hearing Screening - Comments:: Pt denies hearing difficulty Vision Screening - Comments:: Annual vision screenings [facility]; due for exam  Dietary issues and exercise activities discussed: Current Exercise Habits: Home exercise routine, Type of exercise: walking, Time (Minutes): 20, Frequency (Times/Week): 7, Weekly Exercise (Minutes/Week): 140, Intensity: Mild, Exercise limited by: None identified   Goals Addressed             This Visit's Progress    Increase physical activity   On track    Pt would like to increase physical activity and walking more.         Depression Screen PHQ 2/9 Scores 04/20/2021 04/01/2021 12/15/2020 06/14/2020 04/14/2020 06/12/2019 03/31/2019  PHQ - 2 Score 0 0 0 0 0 0 2  PHQ- 9 Score - 0 0 0 - 0 2    Fall Risk Fall Risk  04/20/2021 12/15/2020 04/14/2020 03/31/2019 07/02/2018  Falls in the past year? 0 1 0 0 Yes  Comment - - - - 06/20/2018  Number falls in past yr: 0 0 0 0 2 or more  Injury with Fall? 0 0 0 0 Yes  Risk for fall due to : No Fall Risks - No Fall Risks - -  Follow up Falls prevention discussed Falls evaluation completed Falls prevention discussed Falls prevention discussed Falls evaluation completed    [place]:  Any stairs in or around the home? Yes  If so, are there any without handrails? No  Home free of loose throw rugs in walkways, pet beds, electrical cords, etc? Yes  Adequate lighting in  your home to reduce risk of falls? Yes   ASSISTIVE DEVICES UTILIZED TO PREVENT FALLS:  Life alert? No  Use of a cane, walker or w/c? No  Grab bars in the bathroom? No  Shower chair or bench in shower? Yes  Elevated toilet seat or a handicapped toilet? No   TIMED UP AND GO:  Was the test performed? No . Telephonic visit  Cognitive Function: Normal cognitive status assessed by direct observation by this Nurse Health Advisor. No abnormalities found.       6CIT Screen 04/14/2020 03/31/2019  What Year? 0 points 0 points  What month? 0 points 0 points  What time? 0 points 0 points  Count back from 20 0 points 0 points  Months in reverse 0 points 0 points  Repeat phrase 0 points 0 points  Total Score 0 0    Immunizations Immunization History  Administered Date(s) Administered   PFIZER(Purple Top)SARS-COV-2 Vaccination 02/02/2020, 02/23/2020, 09/13/2020   Pneumococcal Conjugate-13 08/15/2018   Pneumococcal Polysaccharide-23 06/14/2020   Tdap 08/17/2015, 06/20/2018    TDAP status: Up to date  Flu Vaccine status: Declined, Education has been provided regarding the importance of this vaccine but patient still declined. Advised may receive this vaccine at local pharmacy or Health Dept. Aware to provide a copy of the vaccination record if obtained from local pharmacy or Health Dept. Verbalized acceptance and understanding.  Pneumococcal vaccine status: Up to date  Covid-19 vaccine status: Completed vaccines  Qualifies for Shingles Vaccine? No  pt states she never had chicken pox  Screening Tests Health Maintenance  Topic Date Due   OPHTHALMOLOGY EXAM  Never done   COVID-19 Vaccine (4 - Booster for Pfizer series) 12/14/2020   COLONOSCOPY (Pts 45-14yrs Insurance coverage will need to be confirmed)  12/15/2021 (Originally 10/13/1991)   Hepatitis C Screening  12/15/2021 (Originally 10/12/1964)   HEMOGLOBIN A1C  10/01/2021   FOOT EXAM  12/15/2021   MAMMOGRAM  04/12/2023    TETANUS/TDAP  06/20/2028   DEXA SCAN  Completed   PNA vac Low Risk Adult  Completed   HPV VACCINES  Aged Out   INFLUENZA VACCINE  Discontinued   Zoster Vaccines- Shingrix  Discontinued    Health Maintenance  Health Maintenance Due  Topic Date Due   OPHTHALMOLOGY EXAM  Never done   COVID-19 Vaccine (4 - Booster for [place] series) 12/14/2020   Colorectal cancer screening: pt declined colonoscopy  Mammogram status: Completed 04/11/21. Repeat every year  Bone Density status: Completed 02/12/20. Results reflect: Bone density results: OSTEOPOROSIS. Repeat every as directed years.  Lung Cancer Screening: (Low Dose CT Chest recommended if Age 46-80 years, 30 pack-year currently smoking OR have quit w/in 15years.) does not qualify.  Additional Screening:  Hepatitis C Screening: does qualify; postponed  Vision Screening: Recommended annual ophthalmology exams for early detection of glaucoma and other disorders of the eye. Is the patient up to date with their annual eye exam?  No  Who is the provider or what is the name of the office in which the patient attends annual eye exams? Dr. Wyatt Portela.   Dental Screening: Recommended annual dental exams for proper oral hygiene  Community Resource Referral / Chronic Care Management: CRR required this visit?  No   CCM required this visit?  No      Plan:     I have personally reviewed and noted the following in the patient's chart:   Medical and social history Use of alcohol, tobacco or illicit drugs  Current medications and supplements including opioid prescriptions.  Functional ability and status Nutritional status Physical activity Advanced directives List of other physicians Hospitalizations, surgeries, and ER visits in previous 12 months Vitals Screenings to include cognitive, depression, and falls  Referrals and appointments  In addition, I have reviewed and discussed with patient certain preventive protocols, quality metrics, and  best practice recommendations. A written personalized care plan for preventive services as well as general preventive health recommendations were provided to patient.     [name], LPN   03/06/7198   Nurse Notes: none

## 2021-05-05 ENCOUNTER — Other Ambulatory Visit: Payer: Self-pay | Admitting: Family Medicine

## 2021-05-05 DIAGNOSIS — E039 Hypothyroidism, unspecified: Secondary | ICD-10-CM

## 2021-05-06 MED ORDER — LEVOTHYROXINE SODIUM 50 MCG PO TABS: ORAL_TABLET | ORAL | 0 refills | Status: DC

## 2021-05-11 DIAGNOSIS — E119 Type 2 diabetes mellitus without complications: Secondary | ICD-10-CM | POA: Diagnosis not present

## 2021-05-11 DIAGNOSIS — E782 Mixed hyperlipidemia: Secondary | ICD-10-CM | POA: Diagnosis not present

## 2021-05-11 DIAGNOSIS — I639 Cerebral infarction, unspecified: Secondary | ICD-10-CM | POA: Diagnosis not present

## 2021-05-11 DIAGNOSIS — I10 Essential (primary) hypertension: Secondary | ICD-10-CM | POA: Diagnosis not present

## 2021-05-11 DIAGNOSIS — I219 Acute myocardial infarction, unspecified: Secondary | ICD-10-CM | POA: Diagnosis not present

## 2021-05-11 DIAGNOSIS — I2510 Atherosclerotic heart disease of native coronary artery without angina pectoris: Secondary | ICD-10-CM | POA: Diagnosis not present

## 2021-06-14 ENCOUNTER — Ambulatory Visit (INDEPENDENT_AMBULATORY_CARE_PROVIDER_SITE_OTHER): Payer: Medicare Other | Admitting: Family Medicine

## 2021-06-14 ENCOUNTER — Other Ambulatory Visit: Payer: Self-pay

## 2021-06-14 ENCOUNTER — Encounter: Payer: Self-pay | Admitting: Family Medicine

## 2021-06-14 VITALS — BP 130/80 | HR 64 | Ht 61.0 in | Wt 147.0 lb

## 2021-06-14 DIAGNOSIS — M810 Age-related osteoporosis without current pathological fracture: Secondary | ICD-10-CM

## 2021-06-14 DIAGNOSIS — U071 COVID-19: Secondary | ICD-10-CM | POA: Diagnosis not present

## 2021-06-14 DIAGNOSIS — E039 Hypothyroidism, unspecified: Secondary | ICD-10-CM

## 2021-06-14 DIAGNOSIS — E119 Type 2 diabetes mellitus without complications: Secondary | ICD-10-CM | POA: Diagnosis not present

## 2021-06-14 MED ORDER — ALENDRONATE SODIUM 70 MG PO TABS: 70.0000 mg | ORAL_TABLET | ORAL | 11 refills | Status: DC

## 2021-06-14 MED ORDER — LEVOTHYROXINE SODIUM 50 MCG PO TABS: ORAL_TABLET | ORAL | 0 refills | Status: DC

## 2021-06-14 NOTE — Patient Instructions (Signed)
Osteoporosis  Osteoporosis happens when the bones become thin and less dense than normal. Osteoporosis makes bones more brittle and fragile and more likely to break (fracture). Over time, osteoporosis can cause your bones to become so weak that they fracture after a minor fall. Bones in the hip, wrist, and spine are most likelyto fracture due to osteoporosis. What are the causes? The exact cause of this condition is not known. What increases the risk? You are more likely to develop this condition if you: Have family members with this condition. Have poor nutrition. Use the following: Steroid medicines, such as prednisone. Anti-seizure medicines. Nicotine or tobacco, such as cigarettes, e-cigarettes, and chewing tobacco. Are female. Are age 50 or older. Are not physically active (are sedentary). Are of European or Asian descent. Have a small body frame. What are the signs or symptoms? A fracture might be the first sign of osteoporosis, especially if the fracture results from a fall or injury that usually would not cause a bone to break. Other signs and symptoms include: Pain in the neck or low back. Stooped posture. Loss of height. How is this diagnosed? This condition may be diagnosed based on: Your medical history. A physical exam. A bone mineral density test, also called a DXA or DEXA test (dual-energy X-ray absorptiometry test). This test uses X-rays to measure the amount of minerals in your bones. How is this treated? This condition may be treated by: Making lifestyle changes, such as: Including foods with more calcium and vitamin D in your diet. Doing weight-bearing and muscle-strengthening exercises. Stopping tobacco use. Limiting alcohol intake. Taking medicine to slow the process of bone loss or to increase bone density. Taking daily supplements of calcium and vitamin D. Taking hormone replacement medicines, such as estrogen for women and testosterone for  men. Monitoring your levels of calcium and vitamin D. The goal of treatment is to strengthen your bones and lower your risk for afracture. Follow these instructions at home: Eating and drinking Include calcium and vitamin D in your diet. Calcium is important for bone health, and vitamin D helps your body absorb calcium. Good sources of calcium and vitamin D include: Certain fatty fish, such as salmon and tuna. Products that have calcium and vitamin D added to them (are fortified), such as fortified cereals. Egg yolks. Cheese. Liver.  Activity Do exercises as told by your health care provider. Ask your health care provider what exercises and activities are safe for you. You should do: Exercises that make you work against gravity (weight-bearing exercises), such as tai chi, yoga, or walking. Exercises to strengthen muscles, such as lifting weights. Lifestyle Do not drink alcohol if: Your health care provider tells you not to drink. You are pregnant, may be pregnant, or are planning to become pregnant. If you drink alcohol: Limit how much you use to: 0-1 drink a day for women. 0-2 drinks a day for men. Know how much alcohol is in your drink. In the [place] one drink equals one 12 oz bottle of beer (355 mL), one 5 oz glass of wine (148 mL), or one 1 oz glass of hard liquor (44 mL). Do not use any products that contain nicotine or tobacco, such as cigarettes, e-cigarettes, and chewing tobacco. If you need help quitting, ask your health care provider. Preventing falls Use devices to help you move around (mobility aids) as needed, such as canes, walkers, scooters, or crutches. Keep rooms well-lit and clutter-free. Remove tripping hazards from walkways, including cords and throw rugs.   Install grab bars in bathrooms and safety rails on stairs. Use rubber mats in the bathroom and other areas that are often wet or slippery. Wear closed-toe shoes that fit well and support your feet. Wear shoes  that have rubber soles or low heels. Review your medicines with your health care provider. Some medicines can cause dizziness or changes in blood pressure, which can increase your risk of falling. General instructions Take over-the-counter and prescription medicines only as told by your health care provider. Keep all follow-up visits. This is important. Contact a health care provider if: You have never been screened for osteoporosis and you are: A woman who is age 65 or older. A man who is age 70 or older. Get help right away if: You fall or injure yourself. Summary Osteoporosis is thinning and loss of density in your bones. This makes bones more brittle and fragile and more likely to break (fracture),even with minor falls. The goal of treatment is to strengthen your bones and lower your risk for a fracture. Include calcium and vitamin D in your diet. Calcium is important for bone health, and vitamin D helps your body absorb calcium. Talk with your health care provider about screening for osteoporosis if you are a woman who is age 65 or older, or a man who is age 70 or older. This information is not intended to replace advice given to you by your health care provider. Make sure you discuss any questions you have with your healthcare provider. Document Revised: 04/01/2020 Document Reviewed: 04/01/2020 Elsevier Patient Education  2022 [employer]  

## 2021-06-14 NOTE — Progress Notes (Signed)
Date:  06/14/2021   Name:  Patricia Lawson   DOB:  [birth year]   MRN:  [id number]   Chief Complaint: Hypothyroidism  Thyroid Problem Presents for follow-up visit. Symptoms include diaphoresis. Patient reports no anxiety, cold intolerance, constipation, depressed mood, diarrhea, dry skin, fatigue, hair loss, heat intolerance, hoarse voice, leg swelling, menstrual problem, nail problem, palpitations, tremors, visual change, weight gain or weight loss. The symptoms have been stable.   Lab Results  Component Value Date   CREATININE 0.90 06/14/2020   BUN 17 06/14/2020   NA 137 06/14/2020   K 4.6 06/14/2020   CL 107 06/14/2020   CO2 23 06/14/2020   Lab Results  Component Value Date   CHOL 158 06/12/2019   HDL 50 06/12/2019   LDLCALC 72 06/12/2019   TRIG 179 (H) 06/12/2019   CHOLHDL 4.9 06/21/2018   Lab Results  Component Value Date   TSH 1.970 02/14/2021   Lab Results  Component Value Date   HGBA1C 6.6 (H) 04/01/2021   Lab Results  Component Value Date   WBC 12.2 (H) 06/20/2018   HGB 14.1 06/20/2018   HCT 40.2 06/20/2018   MCV 89.3 06/20/2018   PLT 213 06/20/2018   Lab Results  Component Value Date   ALT 14 06/12/2019   AST 18 06/12/2019   ALKPHOS 113 06/12/2019   BILITOT 0.5 06/12/2019     Review of Systems  Constitutional:  Positive for diaphoresis. Negative for fatigue, weight gain and weight loss.  HENT:  Negative for hoarse voice.   Cardiovascular:  Negative for palpitations.  Gastrointestinal:  Negative for constipation and diarrhea.  Endocrine: Negative for cold intolerance and heat intolerance.  Genitourinary:  Negative for menstrual problem.  Neurological:  Negative for tremors.  Psychiatric/Behavioral:  The patient is not nervous/anxious.    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   Fall 06/21/2018   RUE weakness 06/20/2018   Facial laceration 06/20/2018   HTN (hypertension) 06/20/2018   CAD (coronary artery disease) 06/20/2018   UTI (urinary tract infection) 06/20/2018   Patella fracture 04/05/2018   Appendicitis, acute 03/04/2015    No Known Allergies  Past Surgical History:  Procedure Laterality Date   CARDIAC CATHETERIZATION  2010   5 stents placed   CATARACT EXTRACTION W/ INTRAOCULAR LENS  IMPLANT, BILATERAL     HARDWARE REMOVAL Right 12/11/2018   Procedure: SCREW REMOVAL FROM PATELLA;  Surgeon: Corky Mull, MD;  Location: [place];  Service: Orthopedics;  Laterality: Right;  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   LAPAROSCOPIC APPENDECTOMY N/A 03/04/2015   Procedure: APPENDECTOMY LAPAROSCOPIC;  Surgeon: Sherri Rad, MD;  Location: [facility];  Service: General;  Laterality: N/A;   ORIF PATELLA Right 04/06/2018   Procedure: OPEN REDUCTION INTERNAL (ORIF) FIXATION PATELLA;  Surgeon: Leim Fabry, MD;  Location: [facility];  Service: Orthopedics;  Laterality: Right;   STENT PLACE LEFT URETER ([place] HX)     cardiac   TENDON EXPLORATION Right 12/11/2018   Procedure: EXPLORATION OF PERONEAL NERVE LATERAL KNEE;  Surgeon: Milagros Evener  J, MD;  Location: [place];  Service: Orthopedics;  Laterality: Right;  Diabetic - oral meds   THYROIDECTOMY Bilateral     Social History   Tobacco Use   Smoking status: Former    Types: Cigarettes    Quit date: 03/03/2009    Years since quitting: 12.2   Smokeless tobacco: Never  Vaping Use   Vaping Use: Never used  Substance Use Topics   Alcohol use: No   Drug use: Never      Medication list has been reviewed and updated.  No outpatient medications have been marked as taking for the 06/14/21 encounter (Office Visit) with Juline Patch, MD.    [facility] 2/9 Scores 06/14/2021 04/20/2021 04/01/2021 12/15/2020  PHQ - 2 Score 0 0 0 0  PHQ- 9 Score 0 - 0 0    GAD 7 : Generalized Anxiety Score 06/14/2021 04/01/2021 12/15/2020 06/14/2020  Nervous, Anxious, on Edge 0 0 0 0  Control/stop worrying 0 0 0 0  Worry too much - different things 0 0 0 0  Trouble relaxing 0 0 0 0  Restless 0 0 0 0  Easily annoyed or irritable 0 0 0 0  Afraid - awful might happen 0 0 0 0  Total GAD 7 Score 0 0 0 0  Anxiety Difficulty - - - Not difficult at all    BP Readings from Last 3 Encounters:  06/14/21 130/80  04/01/21 128/66  12/15/20 112/70    Physical Exam Vitals and nursing note reviewed.  Constitutional:      Appearance: She is well-developed and normal weight.  HENT:     Head: Normocephalic.     Right Ear: External ear normal.     Left Ear: External ear normal.  Eyes:     General: Lids are everted, no foreign bodies appreciated. No scleral icterus.       Left eye: No foreign body or hordeolum.     Conjunctiva/sclera: Conjunctivae normal.     Right eye: Right conjunctiva is not injected.     Left eye: Left conjunctiva is not injected.     Pupils: Pupils are equal, round, and reactive to light.  Neck:     Thyroid: No thyromegaly.     Vascular: No JVD.     Trachea: No tracheal deviation.  Cardiovascular:     Rate and Rhythm: Normal rate and regular rhythm.     Heart sounds: Normal heart sounds. No murmur heard.   No friction rub. No gallop.  Pulmonary:     Effort: Pulmonary effort is normal. No respiratory distress.     Breath sounds: Normal breath sounds. No wheezing or rales.  Abdominal:     General: Bowel sounds are normal.     Palpations: Abdomen is soft. There is no mass.     Tenderness: There is no abdominal tenderness. There is no guarding or rebound.   Musculoskeletal:        General: No tenderness. Normal range of motion.     Cervical back: Normal range of motion and neck supple.  Lymphadenopathy:     Cervical: No cervical adenopathy.  Skin:    General: Skin is warm.     Findings: No rash.  Neurological:     Mental Status: She is alert and oriented to person, place, and time.     Cranial Nerves: No cranial nerve deficit.     Deep Tendon Reflexes: Reflexes normal.  Psychiatric:        Mood and  Affect: Mood is not anxious or depressed.    Wt Readings from Last 3 Encounters:  06/14/21 147 lb (66.7 kg)  04/01/21 146 lb (66.2 kg)  12/15/20 150 lb (68 kg)    BP 130/80   Pulse 64   Ht '5\' 1"'$  (1.549 m)   Wt 147 lb (66.7 kg)   BMI 27.78 kg/m   Assessment and Plan: 1. Hypothyroidism, unspecified type .  Controlled.  Stable.  TSH in normal range at 1.97.  We will continue levothyroxine at 50 mcg daily. - levothyroxine (SYNTHROID) 50 MCG tablet; One a day  Dispense: 90 tablet; Refill: 0  2. Type 2 diabetes mellitus without complication, without long-term current use of insulin (HCC) Chronic.  Controlled.  Stable.  A1c at 6.6 and we will continue present regimen.  3. Age-related osteoporosis without current pathological fracture Upon review of DEXA scan that was done in 2021 endocrinology it was noted that patient was osteoporotic.  She has been taking vitamin D but no calcium.  Given her small body frame and history and her mother had a history of hip fracture we will going to 4-year put on Fosamax 70 mg once a week and will probably recheck and discontinue after a year. - alendronate (FOSAMAX) 70 MG tablet; Take 1 tablet (70 mg total) by mouth every 7 (seven) days. Take with a full glass of water on an empty stomach.  Dispense: 4 tablet; Refill: 11

## 2021-06-30 DIAGNOSIS — E119 Type 2 diabetes mellitus without complications: Secondary | ICD-10-CM | POA: Diagnosis not present

## 2021-06-30 LAB — HM DIABETES EYE EXAM

## 2021-07-05 ENCOUNTER — Ambulatory Visit: Payer: Self-pay

## 2021-07-05 NOTE — Telephone Encounter (Signed)
Patient called and says since 2 am this morning she has been vomiting. She says it stopped 1 hour ago and she's been able to take 1 sip water with no vomiting. She says she's been up all night unable to keep water down. She says the color vomitus was green, no food particles notes. She denies abdominal pain, no diarrhea, no nausea at this time. She says she's been urinating a little bit throughout the day. She asks if she could have something called in for nausea. I advised home care advice, advised this message will be sent to Dr. Ronnald Ramp and someone will call with her recommendation, patient verbalized understanding.   Message from [place] sent at 07/05/2021  1:44 PM EDT  Pt stated she has been vomiting since early this morning (4 am) and she requests call back from either Dr. Ronnald Ramp or her nurse for guidance. Cb# [phone]    Reason for Disposition  [1] SEVERE vomiting (e.g., 6 or more times/day, vomits everything) BUT [2] hydrated  Answer Assessment - Initial Assessment Questions 1. VOMITING SEVERITY: "How many times have you vomited in the past 24 hours?"     - MILD:  1 - 2 times/day    - MODERATE: 3 - 5 times/day, decreased oral intake without significant weight loss or symptoms of dehydration    - SEVERE: 6 or more times/day, vomits everything or nearly everything, with significant weight loss, symptoms of dehydration      Since 2 am this morning up until the last hour 2. ONSET: "When did the vomiting begin?"      2 am this morning 3. FLUIDS: "What fluids or food have you vomited up today?" "Have you been able to keep any fluids down?"     Water comes up 4. ABDOMINAL PAIN: "Are your having any abdominal pain?" If yes : "How bad is it and what does it feel like?" (e.g., crampy, dull, intermittent, constant)      Nothing major 5. DIARRHEA: "Is there any diarrhea?" If Yes, ask: "How many times today?"      No 6. CONTACTS: "Is there anyone else in the family with the same symptoms?"       No 7. CAUSE: "What do you think is causing your vomiting?"     No idea 8. HYDRATION STATUS: "Any signs of dehydration?" (e.g., dry mouth [not only dry lips], too weak to stand) "When did you last urinate?"     No, urinated last 1 hour ago 9. OTHER SYMPTOMS: "Do you have any other symptoms?" (e.g., fever, headache, vertigo, vomiting blood or coffee grounds, recent head injury)     No 10. PREGNANCY: "Is there any chance you are pregnant?" "When was your last menstrual period?"       No  Protocols used: Vomiting-A-AH

## 2021-07-06 ENCOUNTER — Telehealth: Payer: Self-pay

## 2021-07-06 NOTE — Telephone Encounter (Signed)
Spoke to pt- the nausea and vomiting have stopped. I advised her to take a covid test just to be on the safe side due to the vomiting. She said she would and she will try to eat some crackers to see if she can keep those down. I offered to call in Phenergan, but she is not having nausea anymore. Advised to push fluids and try to start out with something light on the stomach-  jello, applesauce, broth

## 2021-07-13 ENCOUNTER — Other Ambulatory Visit: Payer: Self-pay

## 2021-07-13 ENCOUNTER — Telehealth: Payer: Self-pay

## 2021-07-13 DIAGNOSIS — R110 Nausea: Secondary | ICD-10-CM

## 2021-07-13 MED ORDER — PROMETHAZINE HCL 25 MG PO TABS: 25.0000 mg | ORAL_TABLET | Freq: Three times a day (TID) | ORAL | 0 refills | Status: DC | PRN

## 2021-07-13 NOTE — Progress Notes (Signed)
nausea

## 2021-07-13 NOTE — Telephone Encounter (Signed)
Copied from [place] [id number]. Topic: General - Other >> Jul 13, 2021  9:20 AM Leward Quan A wrote: Reason for CRM: Patient called in asking for Baxter Flattery to give her a cal. Say tha she have a few questions to ask Please call Ph# [phone]

## 2021-07-15 ENCOUNTER — Telehealth: Payer: Self-pay

## 2021-07-15 NOTE — Telephone Encounter (Signed)
Called pt- she will do a FIT test in December when she comes for her appt with Dr Ronnald Ramp

## 2021-08-03 ENCOUNTER — Other Ambulatory Visit: Payer: Self-pay | Admitting: Family Medicine

## 2021-08-03 DIAGNOSIS — E039 Hypothyroidism, unspecified: Secondary | ICD-10-CM

## 2021-08-18 DIAGNOSIS — M25531 Pain in right wrist: Secondary | ICD-10-CM | POA: Diagnosis not present

## 2021-08-18 DIAGNOSIS — R58 Hemorrhage, not elsewhere classified: Secondary | ICD-10-CM | POA: Diagnosis not present

## 2021-08-18 DIAGNOSIS — K08119 Complete loss of teeth due to trauma, unspecified class: Secondary | ICD-10-CM | POA: Diagnosis not present

## 2021-08-18 DIAGNOSIS — Y998 Other external cause status: Secondary | ICD-10-CM | POA: Diagnosis not present

## 2021-08-18 DIAGNOSIS — M898X8 Other specified disorders of bone, other site: Secondary | ICD-10-CM | POA: Diagnosis not present

## 2021-08-18 DIAGNOSIS — Z043 Encounter for examination and observation following other accident: Secondary | ICD-10-CM | POA: Diagnosis not present

## 2021-08-18 DIAGNOSIS — S02401B Maxillary fracture, unspecified, initial encounter for open fracture: Secondary | ICD-10-CM | POA: Diagnosis not present

## 2021-08-18 DIAGNOSIS — S01521A Laceration with foreign body of lip, initial encounter: Secondary | ICD-10-CM | POA: Diagnosis not present

## 2021-08-18 DIAGNOSIS — W1839XA Other fall on same level, initial encounter: Secondary | ICD-10-CM | POA: Diagnosis not present

## 2021-08-18 DIAGNOSIS — S0990XA Unspecified injury of head, initial encounter: Secondary | ICD-10-CM | POA: Diagnosis not present

## 2021-08-18 DIAGNOSIS — W19XXXA Unspecified fall, initial encounter: Secondary | ICD-10-CM | POA: Diagnosis not present

## 2021-08-18 DIAGNOSIS — I10 Essential (primary) hypertension: Secondary | ICD-10-CM | POA: Diagnosis not present

## 2021-10-08 ENCOUNTER — Other Ambulatory Visit: Payer: Self-pay | Admitting: Family Medicine

## 2021-10-08 DIAGNOSIS — E119 Type 2 diabetes mellitus without complications: Secondary | ICD-10-CM

## 2021-10-08 NOTE — Telephone Encounter (Signed)
Requested medication (s) are due for refill today: yes  Requested medication (s) are on the active medication list: yes  Last refill:  04/01/21 #90 1 RF  Future visit scheduled: yes  Notes to clinic:  overdue lab work   Requested Prescriptions  Pending Prescriptions Disp Refills   metFORMIN (GLUCOPHAGE-XR) 750 MG 24 hr tablet [Pharmacy Med Name: metFORMIN HCl ER 750 MG Oral Tablet Extended Release 24 Hour] 90 tablet 0    Sig: Take 1 tablet by mouth once daily with breakfast     Endocrinology:  Diabetes - Biguanides Failed - 10/08/2021  8:11 AM      Failed - Cr in normal range and within 360 days    Creatinine  Date Value Ref Range Status  06/26/2012 1.04 0.60 - 1.30 mg/dL Final   Creatinine, Ser  Date Value Ref Range Status  06/14/2020 0.90 0.44 - 1.00 mg/dL Final          Failed - HBA1C is between 0 and 7.9 and within 180 days    Hgb A1c MFr Bld  Date Value Ref Range Status  04/01/2021 6.6 (H) 4.8 - 5.6 % Final    Comment:             Prediabetes: 5.7 - 6.4          Diabetes: >6.4          Glycemic control for adults with diabetes: <7.0           Failed - eGFR in normal range and within 360 days    EGFR (African American)  Date Value Ref Range Status  06/26/2012 >60  Final   GFR calc Af Amer  Date Value Ref Range Status  06/14/2020 >60 >60 mL/min Final   EGFR (Non-African Amer.)  Date Value Ref Range Status  06/26/2012 56 (L)  Final    Comment:    eGFR values <50m/min/1.73 m2 may be an indication of chronic kidney disease (CKD). Calculated eGFR is useful in patients with stable renal function. The eGFR calculation will not be reliable in acutely ill patients when serum creatinine is changing rapidly. It is not useful in  patients on dialysis. The eGFR calculation may not be applicable to patients at the low and high extremes of body sizes, pregnant women, and vegetarians.    GFR calc non Af Amer  Date Value Ref Range Status  06/14/2020 >60 >60 mL/min  Final          Passed - Valid encounter within last 6 months    Recent Outpatient Visits           3 months ago Hypothyroidism, unspecified type   [facility]   6 months ago Type 2 diabetes mellitus without complication, without long-term current use of insulin ([place]   [facility]   9 months ago Type 2 diabetes mellitus without complication, without long-term current use of insulin ([place]   [facility]   1 year ago Type 2 diabetes mellitus without complication, without long-term current use of insulin ([place]   [facility]   1 year ago Type 2 diabetes mellitus without complication, without long-term current use of insulin ([place]   [facility]       Future Appointments             In 1 week [facility]  [place]

## 2021-10-17 ENCOUNTER — Other Ambulatory Visit: Payer: Self-pay

## 2021-10-17 ENCOUNTER — Ambulatory Visit (INDEPENDENT_AMBULATORY_CARE_PROVIDER_SITE_OTHER): Payer: Medicare Other | Admitting: Family Medicine

## 2021-10-17 ENCOUNTER — Encounter: Payer: Self-pay | Admitting: Family Medicine

## 2021-10-17 VITALS — BP 120/80 | HR 72 | Ht 61.0 in | Wt 137.0 lb

## 2021-10-17 DIAGNOSIS — M810 Age-related osteoporosis without current pathological fracture: Secondary | ICD-10-CM

## 2021-10-17 DIAGNOSIS — E119 Type 2 diabetes mellitus without complications: Secondary | ICD-10-CM | POA: Diagnosis not present

## 2021-10-17 DIAGNOSIS — E782 Mixed hyperlipidemia: Secondary | ICD-10-CM | POA: Diagnosis not present

## 2021-10-17 DIAGNOSIS — E039 Hypothyroidism, unspecified: Secondary | ICD-10-CM | POA: Diagnosis not present

## 2021-10-17 MED ORDER — LEVOTHYROXINE SODIUM 50 MCG PO TABS
50.0000 ug | ORAL_TABLET | Freq: Every day | ORAL | 1 refills | Status: DC
Start: 2021-10-17 — End: 2022-04-17

## 2021-10-17 MED ORDER — ALENDRONATE SODIUM 70 MG PO TABS
70.0000 mg | ORAL_TABLET | ORAL | 11 refills | Status: DC
Start: 2021-10-17 — End: 2022-04-17

## 2021-10-17 MED ORDER — METFORMIN HCL ER 750 MG PO TB24: 750.0000 mg | ORAL_TABLET | Freq: Every day | ORAL | 1 refills | Status: DC

## 2021-10-17 NOTE — Progress Notes (Signed)
Date:  10/17/2021   Name:  Patricia Lawson   DOB:  08-Sep-1946   MRN:  [id number]   Chief Complaint: Diabetes, Osteoporosis, and Hypothyroidism  Diabetes She presents for her follow-up diabetic visit. She has type 2 diabetes mellitus. Her disease course has been stable. There are no hypoglycemic associated symptoms. Associated symptoms include weight loss. Pertinent negatives for diabetes include no fatigue. There are no hypoglycemic complications. Symptoms are stable. There are no diabetic complications. Risk factors for coronary artery disease include hypertension. Current diabetic treatment includes oral agent (monotherapy). She is compliant with treatment all of the time. Her weight is stable. She is following a generally healthy diet. Meal planning includes avoidance of concentrated sweets and carbohydrate counting. Her home blood glucose trend is fluctuating minimally. Her breakfast blood glucose is taken between 8-9 am. Her breakfast blood glucose range is generally 90-110 mg/dl. An ACE inhibitor/angiotensin II receptor blocker is being taken.  Thyroid Problem Presents for follow-up visit. Symptoms include hair loss and weight loss. Patient reports no cold intolerance, diaphoresis, fatigue or palpitations. The symptoms have been stable. Her past medical history is significant for hyperlipidemia. There is no history of diabetes.  Hyperlipidemia This is a chronic problem. The current episode started more than 1 year ago. The problem is controlled. Recent lipid tests were reviewed and are normal. She has no history of chronic renal disease, diabetes, hypothyroidism, liver disease, obesity or nephrotic syndrome. Factors aggravating her hyperlipidemia include thiazides. Pertinent negatives include no shortness of breath. Current antihyperlipidemic treatment includes statins. There are no compliance problems.    Lab Results  Component Value Date   NA 137 06/14/2020   K 4.6 06/14/2020   CO2 23  06/14/2020   GLUCOSE 122 (H) 06/14/2020   BUN 17 06/14/2020   CREATININE 0.90 06/14/2020   CALCIUM 10.5 (H) 06/14/2020   GFRNONAA >60 06/14/2020   Lab Results  Component Value Date   CHOL 158 06/12/2019   HDL 50 06/12/2019   LDLCALC 72 06/12/2019   TRIG 179 (H) 06/12/2019   CHOLHDL 4.9 06/21/2018   Lab Results  Component Value Date   TSH 1.970 02/14/2021   Lab Results  Component Value Date   HGBA1C 6.6 (H) 04/01/2021   Lab Results  Component Value Date   WBC 12.2 (H) 06/20/2018   HGB 14.1 06/20/2018   HCT 40.2 06/20/2018   MCV 89.3 06/20/2018   PLT 213 06/20/2018   Lab Results  Component Value Date   ALT 14 06/12/2019   AST 18 06/12/2019   ALKPHOS 113 06/12/2019   BILITOT 0.5 06/12/2019   No results found for: 25OHVITD2, 25OHVITD3, VD25OH   Review of Systems  Constitutional:  Positive for weight loss. Negative for diaphoresis and fatigue.  Respiratory:  Negative for shortness of breath.   Cardiovascular:  Negative for palpitations.  Endocrine: Negative for cold intolerance.   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   Fall 06/21/2018   RUE weakness 06/20/2018   Facial laceration 06/20/2018   HTN (hypertension) 06/20/2018   CAD (coronary  artery disease) 06/20/2018   UTI (urinary tract infection) 06/20/2018   Patella fracture 04/05/2018   Appendicitis, acute 03/04/2015    No Known Allergies  Past Surgical History:  Procedure Laterality Date   CARDIAC  CATHETERIZATION  2010   5 stents placed   CATARACT EXTRACTION W/ INTRAOCULAR LENS  IMPLANT, BILATERAL     HARDWARE REMOVAL Right 12/11/2018   Procedure: SCREW REMOVAL FROM PATELLA;  Surgeon: Corky Mull, MD;  Location: [place];  Service: Orthopedics;  Laterality: Right;  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   LAPAROSCOPIC APPENDECTOMY N/A 03/04/2015   Procedure: APPENDECTOMY LAPAROSCOPIC;  Surgeon: Sherri Rad, MD;  Location: [facility];  Service: General;  Laterality: N/A;   ORIF PATELLA Right 04/06/2018   Procedure: OPEN REDUCTION INTERNAL (ORIF) FIXATION PATELLA;  Surgeon: Leim Fabry, MD;  Location: [facility];  Service: Orthopedics;  Laterality: Right;   STENT PLACE LEFT URETER ([place] HX)     cardiac   TENDON EXPLORATION Right 12/11/2018   Procedure: EXPLORATION OF PERONEAL NERVE LATERAL KNEE;  Surgeon: Corky Mull, MD;  Location: [place];  Service: Orthopedics;  Laterality: Right;  Diabetic - oral meds   THYROIDECTOMY Bilateral     Social History   Tobacco Use   Smoking status: Former    Types: Cigarettes    Quit date: 03/03/2009    Years since quitting: 12.6   Smokeless tobacco: Never  Vaping Use   Vaping Use: Never used  Substance Use Topics   Alcohol use: No   Drug use: Never     Medication list has been reviewed and updated.  Current Meds  Medication Sig   alendronate (FOSAMAX) 70 MG tablet Take 1 tablet (70 mg total) by mouth every 7 (seven) days. Take with a full glass of water on an empty stomach.   aspirin EC 81 MG EC tablet Take 1 tablet (81 mg total) by mouth daily.   atorvastatin (LIPITOR) 80 MG tablet Take 1 tablet (80 mg total) by mouth daily. Dr Clayborn Bigness   Cholecalciferol 50 MCG (2000 UT) CAPS Take 1 capsule by mouth daily.   EUTHYROX 50 MCG tablet Take 1 tablet by mouth once daily   irbesartan (AVAPRO) 75 MG tablet Take 75 mg by mouth daily. Dr Clayborn Bigness   metFORMIN (GLUCOPHAGE-XR) 750 MG 24 hr tablet Take 1 tablet by mouth once daily with  breakfast   metoprolol succinate (TOPROL-XL) 100 MG 24 hr tablet Take 100 mg by mouth daily. Dr Clayborn Bigness   [DISCONTINUED] promethazine (PHENERGAN) 25 MG tablet Take 1 tablet (25 mg total) by mouth every 8 (eight) hours as needed for nausea or vomiting.    PHQ 2/9 Scores 10/17/2021 06/14/2021 04/20/2021 04/01/2021  PHQ - 2 Score 0 0 0 0  PHQ- 9 Score 0 0 - 0    GAD 7 : Generalized Anxiety Score 10/17/2021 06/14/2021 04/01/2021 12/15/2020  Nervous, Anxious, on Edge 0 0 0 0  Control/stop worrying 0 0 0 0  Worry too much - different things 0 0 0 0  Trouble relaxing 0 0 0 0  Restless 0 0 0 0  Easily annoyed or irritable 0 0 0 0  Afraid - awful might happen 0 0 0 0  Total GAD 7 Score 0 0 0 0  Anxiety Difficulty - - - -    BP Readings from Last 3 Encounters:  10/17/21 120/80  06/14/21 130/80  04/01/21 128/66    Physical Exam Vitals and nursing note reviewed.  Constitutional:  General: She is not in acute distress.    Appearance: She is not diaphoretic.  HENT:     Head: Normocephalic and atraumatic.     Right Ear: External ear normal.     Left Ear: External ear normal.     Nose: Nose normal.  Eyes:     General:        Right eye: No discharge.        Left eye: No discharge.     Conjunctiva/sclera: Conjunctivae normal.     Pupils: Pupils are equal, round, and reactive to light.  Neck:     Thyroid: No thyromegaly.     Vascular: No JVD.  Cardiovascular:     Rate and Rhythm: Normal rate and regular rhythm.     Heart sounds: Normal heart sounds, S1 normal and S2 normal. No murmur heard. No systolic murmur is present.  No diastolic murmur is present.    No friction rub. No gallop. No S3 or S4 sounds.  Pulmonary:     Effort: Pulmonary effort is normal.     Breath sounds: Normal breath sounds. No decreased breath sounds, wheezing, rhonchi or rales.  Abdominal:     General: Bowel sounds are normal.     Palpations: Abdomen is soft. There is no mass.     Tenderness: There is no  abdominal tenderness. There is no guarding.  Musculoskeletal:        General: Normal range of motion.     Cervical back: Normal range of motion and neck supple.  Lymphadenopathy:     Cervical: No cervical adenopathy.  Skin:    General: Skin is warm and dry.  Neurological:     Mental Status: She is alert.     Deep Tendon Reflexes: Reflexes are normal and symmetric.    Wt Readings from Last 3 Encounters:  10/17/21 137 lb (62.1 kg)  06/14/21 147 lb (66.7 kg)  04/01/21 146 lb (66.2 kg)    BP 120/80    Pulse 72    Ht 5\' 1"  (1.549 m)    Wt 137 lb (62.1 kg)    BMI 25.89 kg/m   Assessment and Plan:  1. Type 2 diabetes mellitus without complication, without long-term current use of insulin (HCC) Chronic.  Controlled.  Stable.  Blood sugars have been in the 101 10 range and the last A1c was 6.6.  We will continue metformin XR 750 mg once a day and check A1c and renal function panel for current electrolytes and GFR. - metFORMIN (GLUCOPHAGE-XR) 750 MG 24 hr tablet; Take 1 tablet (750 mg total) by mouth daily with breakfast.  Dispense: 90 tablet; Refill: 1 - HgB A1c - Renal Function Panel  2. Hypothyroidism, unspecified type Chronic.  Controlled.  Stable.  Will check TSH for current stability of thyroid supplementation.  Anticipating that we will continue on levothyroxine 50 mcg daily. - levothyroxine (EUTHYROX) 50 MCG tablet; Take 1 tablet (50 mcg total) by mouth daily.  Dispense: 90 tablet; Refill: 1 - TSH  3. Age-related osteoporosis without current pathological fracture Patient is doing well on current dosing of Fosamax 70 mg q. weekly.  She is tolerating well without GI symptoms. - alendronate (FOSAMAX) 70 MG tablet; Take 1 tablet (70 mg total) by mouth every 7 (seven) days. Take with a full glass of water on an empty stomach.  Dispense: 4 tablet; Refill: 11  4. Mixed hyperlipidemia Chronic.  Controlled.  Stable.  Patient has had elevations in the past of LDL in  the mild range and  triglycerides.  Will check lipid panel for current level of control. - Lipid Panel With LDL/HDL Ratio

## 2021-10-18 LAB — RENAL FUNCTION PANEL
Albumin: 4.5 g/dL (ref 3.7–4.7)
BUN/Creatinine Ratio: 18 (ref 12–28)
BUN: 23 mg/dL (ref 8–27)
CO2: 24 mmol/L (ref 20–29)
Calcium: 9.9 mg/dL (ref 8.7–10.3)
Chloride: 103 mmol/L (ref 96–106)
Creatinine, Ser: 1.27 mg/dL — ABNORMAL HIGH (ref 0.57–1.00)
Glucose: 95 mg/dL (ref 70–99)
Phosphorus: 3.3 mg/dL (ref 3.0–4.3)
Potassium: 5.1 mmol/L (ref 3.5–5.2)
Sodium: 140 mmol/L (ref 134–144)
eGFR: 44 mL/min/{1.73_m2} — ABNORMAL LOW (ref >59)

## 2021-10-18 LAB — HEMOGLOBIN A1C
Est. average glucose Bld gHb Est-mCnc: 131 mg/dL
Hgb A1c MFr Bld: 6.2 % — ABNORMAL HIGH (ref 4.8–5.6)

## 2021-10-18 LAB — LIPID PANEL WITH LDL/HDL RATIO
Cholesterol, Total: 167 mg/dL (ref 100–199)
HDL: 42 mg/dL (ref >39)
LDL Chol Calc (NIH): 83 mg/dL (ref 0–99)
LDL/HDL Ratio: 2 ratio (ref 0.0–3.2)
Triglycerides: 252 mg/dL — ABNORMAL HIGH (ref 0–149)
VLDL Cholesterol Cal: 42 mg/dL — ABNORMAL HIGH (ref 5–40)

## 2021-10-18 LAB — TSH: TSH: 1.64 u[IU]/mL (ref 0.450–4.500)

## 2021-12-31 ENCOUNTER — Other Ambulatory Visit: Payer: Self-pay | Admitting: Family Medicine

## 2021-12-31 DIAGNOSIS — E119 Type 2 diabetes mellitus without complications: Secondary | ICD-10-CM

## 2022-04-13 ENCOUNTER — Other Ambulatory Visit: Payer: Self-pay | Admitting: Family Medicine

## 2022-04-13 DIAGNOSIS — E119 Type 2 diabetes mellitus without complications: Secondary | ICD-10-CM

## 2022-04-17 ENCOUNTER — Encounter: Payer: Self-pay | Admitting: Family Medicine

## 2022-04-17 ENCOUNTER — Ambulatory Visit (INDEPENDENT_AMBULATORY_CARE_PROVIDER_SITE_OTHER): Payer: Medicare Other | Admitting: Family Medicine

## 2022-04-17 VITALS — BP 122/80 | HR 64 | Ht 61.0 in | Wt 128.0 lb

## 2022-04-17 DIAGNOSIS — M810 Age-related osteoporosis without current pathological fracture: Secondary | ICD-10-CM | POA: Diagnosis not present

## 2022-04-17 DIAGNOSIS — E039 Hypothyroidism, unspecified: Secondary | ICD-10-CM | POA: Diagnosis not present

## 2022-04-17 DIAGNOSIS — E119 Type 2 diabetes mellitus without complications: Secondary | ICD-10-CM

## 2022-04-17 MED ORDER — LEVOTHYROXINE SODIUM 50 MCG PO TABS: 50.0000 ug | ORAL_TABLET | Freq: Every day | ORAL | 1 refills | Status: DC

## 2022-04-17 MED ORDER — ALENDRONATE SODIUM 70 MG PO TABS: 70.0000 mg | ORAL_TABLET | ORAL | 1 refills | Status: DC

## 2022-04-17 MED ORDER — METFORMIN HCL ER 750 MG PO TB24: 750.0000 mg | ORAL_TABLET | Freq: Every day | ORAL | 1 refills | Status: DC

## 2022-04-17 NOTE — Progress Notes (Signed)
Date:  04/17/2022   Name:  Patricia Lawson   DOB:  03-08-1946   MRN:  [id number]   Chief Complaint: Diabetes, Hypothyroidism, and Osteoporosis (Take 6 more months of fosamax)  Diabetes She presents for her follow-up diabetic visit. She has type 2 diabetes mellitus. Her disease course has been stable. There are no hypoglycemic associated symptoms. Pertinent negatives for hypoglycemia include no dizziness, headaches or nervousness/anxiousness. Associated symptoms include weight loss. Pertinent negatives for diabetes include no chest pain, no fatigue, no polydipsia and no polyuria. There are no hypoglycemic complications. Symptoms are stable. There are no diabetic complications. Current diabetic treatment includes oral agent (monotherapy). She is compliant with treatment all of the time. Her weight is stable. She is following a generally healthy diet. Meal planning includes avoidance of concentrated sweets and carbohydrate counting. She participates in exercise daily. Her home blood glucose trend is fluctuating minimally. Her breakfast blood glucose range is generally 90-110 mg/dl.  Thyroid Problem Presents for follow-up visit. Symptoms include weight loss. Patient reports no anxiety, constipation, depressed mood, diaphoresis, diarrhea, fatigue or palpitations.    Lab Results  Component Value Date   NA 140 10/17/2021   K 5.1 10/17/2021   CO2 24 10/17/2021   GLUCOSE 95 10/17/2021   BUN 23 10/17/2021   CREATININE 1.27 (H) 10/17/2021   CALCIUM 9.9 10/17/2021   EGFR 44 (L) 10/17/2021   GFRNONAA >60 06/14/2020   Lab Results  Component Value Date   CHOL 167 10/17/2021   HDL 42 10/17/2021   LDLCALC 83 10/17/2021   TRIG 252 (H) 10/17/2021   CHOLHDL 4.9 06/21/2018   Lab Results  Component Value Date   TSH 1.640 10/17/2021   Lab Results  Component Value Date   HGBA1C 6.2 (H) 10/17/2021   Lab Results  Component Value Date   WBC 12.2 (H) 06/20/2018   HGB 14.1 06/20/2018   HCT 40.2  06/20/2018   MCV 89.3 06/20/2018   PLT 213 06/20/2018   Lab Results  Component Value Date   ALT 14 06/12/2019   AST 18 06/12/2019   ALKPHOS 113 06/12/2019   BILITOT 0.5 06/12/2019   No results found for: "25OHVITD2", "25OHVITD3", "VD25OH"   Review of Systems  Constitutional:  Positive for weight loss. Negative for chills, diaphoresis, fatigue and fever.  HENT:  Negative for drooling, ear discharge, ear pain and sore throat.   Respiratory:  Negative for cough, shortness of breath and wheezing.   Cardiovascular:  Negative for chest pain, palpitations and leg swelling.  Gastrointestinal:  Negative for abdominal pain, blood in stool, constipation, diarrhea and nausea.  Endocrine: Negative for polydipsia and polyuria.  Genitourinary:  Negative for dysuria, frequency, hematuria and urgency.  Musculoskeletal:  Negative for back pain, myalgias and neck pain.  Skin:  Negative for rash.  Allergic/Immunologic: Negative for environmental allergies.  Neurological:  Negative for dizziness and headaches.  Hematological:  Does not bruise/bleed easily.  Psychiatric/Behavioral:  Negative for suicidal ideas. The patient is not nervous/anxious.     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  Fall 06/21/2018   RUE weakness 06/20/2018   Facial laceration 06/20/2018   HTN (hypertension) 06/20/2018   CAD (coronary artery disease) 06/20/2018   UTI (urinary tract infection) 06/20/2018   Patella fracture  04/05/2018   Appendicitis, acute 03/04/2015    No Known Allergies  Past Surgical History:  Procedure Laterality Date   CARDIAC CATHETERIZATION  2010   5 stents placed   CATARACT EXTRACTION W/ INTRAOCULAR LENS  IMPLANT, BILATERAL     HARDWARE REMOVAL Right 12/11/2018   Procedure: SCREW REMOVAL FROM PATELLA;  Surgeon: Corky Mull, MD;  Location: [place];  Service: Orthopedics;  Laterality: Right;  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   LAPAROSCOPIC APPENDECTOMY N/A 03/04/2015   Procedure: APPENDECTOMY LAPAROSCOPIC;  Surgeon: Sherri Rad, MD;  Location: [facility];  Service: General;  Laterality: N/A;   ORIF PATELLA Right 04/06/2018   Procedure: OPEN REDUCTION INTERNAL (ORIF) FIXATION PATELLA;  Surgeon: Leim Fabry, MD;  Location: [facility];  Service: Orthopedics;  Laterality: Right;   STENT PLACE LEFT URETER ([place] HX)     cardiac   TENDON EXPLORATION Right 12/11/2018   Procedure: EXPLORATION OF PERONEAL NERVE LATERAL KNEE;  Surgeon: Corky Mull, MD;  Location: [place];  Service: Orthopedics;  Laterality: Right;  Diabetic - oral meds   THYROIDECTOMY Bilateral     Social History   Tobacco Use   Smoking status: Former    Types: Cigarettes    Quit date: 03/03/2009    Years since quitting: 13.1   Smokeless tobacco: Never  Vaping Use   Vaping Use: Never used  Substance Use Topics   Alcohol use: No   Drug use: Never     Medication list has been reviewed and updated.  Current Meds  Medication Sig   alendronate (FOSAMAX) 70 MG tablet Take 1 tablet (70 mg total) by mouth every 7 (seven) days. Take with a full glass of water on an empty stomach.   aspirin EC 81 MG EC tablet Take 1 tablet (81 mg total) by mouth daily.   atorvastatin (LIPITOR) 80 MG tablet Take 1 tablet (80 mg total) by mouth daily. Dr Clayborn Bigness   Cholecalciferol 50 MCG (2000 UT) CAPS Take 1 capsule by mouth daily.   irbesartan (AVAPRO) 75 MG tablet Take 75 mg by mouth daily. Dr Clayborn Bigness    levothyroxine (EUTHYROX) 50 MCG tablet Take 1 tablet (50 mcg total) by mouth daily.   metFORMIN (GLUCOPHAGE-XR) 750 MG 24 hr tablet Take 1 tablet by mouth once daily with breakfast   metoprolol succinate (TOPROL-XL) 100 MG 24 hr tablet Take 100 mg by mouth daily. Dr Clayborn Bigness       04/17/2022   10:11 AM 10/17/2021    8:59 AM 06/14/2021   10:08 AM 04/01/2021   10:05 AM  GAD 7 : Generalized Anxiety Score  Nervous, Anxious, on Edge 0 0 0 0  Control/stop worrying 0 0 0 0  Worry too much - different things 0 0 0 0  Trouble relaxing 0 0 0 0  Restless 0 0 0 0  Easily annoyed or irritable 0 0 0 0  Afraid - awful might happen 0 0 0 0  Total GAD 7 Score 0 0 0 0  Anxiety Difficulty Not difficult at all          04/17/2022   10:11 AM  Depression screen PHQ 2/9  Decreased Interest 0  Down, Depressed, Hopeless 0  PHQ - 2 Score 0  Altered sleeping 0  Tired, decreased  energy 0  Change in appetite 0  Feeling bad or failure about yourself  0  Trouble concentrating 0  Moving slowly or fidgety/restless 0  Suicidal thoughts 0  PHQ-9 Score 0  Difficult doing work/chores Not difficult at all    BP Readings from Last 3 Encounters:  04/17/22 122/80  10/17/21 120/80  06/14/21 130/80    Physical Exam Vitals reviewed.  Constitutional:      Appearance: She is well-developed.  HENT:     Head: Normocephalic.     Right Ear: Tympanic membrane, ear canal and external ear normal.     Left Ear: Tympanic membrane, ear canal and external ear normal.     Nose: Nose normal. No congestion or rhinorrhea.  Eyes:     General: Lids are everted, no foreign bodies appreciated. No scleral icterus.       Left eye: No foreign body or hordeolum.     Conjunctiva/sclera: Conjunctivae normal.     Right eye: Right conjunctiva is not injected.     Left eye: Left conjunctiva is not injected.     Pupils: Pupils are equal, round, and reactive to light.  Neck:     Thyroid: No thyroid mass, thyromegaly or thyroid  tenderness.     Vascular: No JVD.     Trachea: No tracheal deviation.  Cardiovascular:     Rate and Rhythm: Normal rate and regular rhythm.     Heart sounds: Normal heart sounds, S1 normal and S2 normal. No murmur heard.    No systolic murmur is present.     No diastolic murmur is present.     No friction rub. No gallop. No S3 or S4 sounds.  Pulmonary:     Effort: Pulmonary effort is normal. No respiratory distress.     Breath sounds: Normal breath sounds. No wheezing, rhonchi or rales.  Abdominal:     General: Bowel sounds are normal.     Palpations: Abdomen is soft. There is no hepatomegaly, splenomegaly or mass.     Tenderness: There is no abdominal tenderness. There is no guarding or rebound.  Musculoskeletal:        General: No tenderness. Normal range of motion.     Cervical back: Normal range of motion and neck supple.     Right lower leg: No edema.     Left lower leg: No edema.  Lymphadenopathy:     Cervical: No cervical adenopathy.     Right cervical: No superficial, deep or posterior cervical adenopathy.    Left cervical: No superficial, deep or posterior cervical adenopathy.  Skin:    General: Skin is warm.     Findings: No rash.  Neurological:     Mental Status: She is alert and oriented to person, place, and time.     Cranial Nerves: No cranial nerve deficit.     Deep Tendon Reflexes: Reflexes normal.  Psychiatric:        Mood and Affect: Mood is not anxious or depressed.     Wt Readings from Last 3 Encounters:  04/17/22 128 lb (58.1 kg)  10/17/21 137 lb (62.1 kg)  06/14/21 147 lb (66.7 kg)    BP 122/80   Pulse 64   Ht '5\' 1"'  (1.549 m)   Wt 128 lb (58.1 kg)   BMI 24.19 kg/m   Assessment and Plan:  1. Hypothyroidism, unspecified type Chronic.  Controlled.  Stable.  Upon checking TSH we will likely continue levothyroxine 50 mcg daily. - levothyroxine (EUTHYROX) 50 MCG  tablet; Take 1 tablet (50 mcg total) by mouth daily.  Dispense: 90 tablet; Refill:  1 - TSH  2. Type 2 diabetes mellitus without complication, without long-term current use of insulin (HCC) Chronic.  Controlled.  Stable.  Continue metformin XR 750 mg once a day.  We will check renal function panel and A1c. - metFORMIN (GLUCOPHAGE-XR) 750 MG 24 hr tablet; Take 1 tablet (750 mg total) by mouth daily with breakfast.  Dispense: 90 tablet; Refill: 1 - Renal Function Panel - HgB A1c  3. Age-related osteoporosis without current pathological fracture Chronic.  Controlled.  Stable.  Continue Fosamax 70 mg 1 weekly. - alendronate (FOSAMAX) 70 MG tablet; Take 1 tablet (70 mg total) by mouth every 7 (seven) days. Take with a full glass of water on an empty stomach.  Dispense: 12 tablet; Refill: 1

## 2022-04-18 LAB — HEMOGLOBIN A1C
Est. average glucose Bld gHb Est-mCnc: 131 mg/dL
Hgb A1c MFr Bld: 6.2 % — ABNORMAL HIGH (ref 4.8–5.6)

## 2022-04-18 LAB — RENAL FUNCTION PANEL
Albumin: 4.7 g/dL (ref 3.7–4.7)
BUN/Creatinine Ratio: 15 (ref 12–28)
BUN: 25 mg/dL (ref 8–27)
CO2: 21 mmol/L (ref 20–29)
Calcium: 10.3 mg/dL (ref 8.7–10.3)
Chloride: 102 mmol/L (ref 96–106)
Creatinine, Ser: 1.67 mg/dL — ABNORMAL HIGH (ref 0.57–1.00)
Glucose: 104 mg/dL — ABNORMAL HIGH (ref 70–99)
Phosphorus: 3.4 mg/dL (ref 3.0–4.3)
Potassium: 5.2 mmol/L (ref 3.5–5.2)
Sodium: 139 mmol/L (ref 134–144)
eGFR: 32 mL/min/{1.73_m2} — ABNORMAL LOW (ref >59)

## 2022-04-18 LAB — TSH: TSH: 1.56 u[IU]/mL (ref 0.450–4.500)

## 2022-04-24 ENCOUNTER — Ambulatory Visit (INDEPENDENT_AMBULATORY_CARE_PROVIDER_SITE_OTHER): Payer: Medicare Other

## 2022-04-24 DIAGNOSIS — Z1231 Encounter for screening mammogram for malignant neoplasm of breast: Secondary | ICD-10-CM | POA: Diagnosis not present

## 2022-04-24 DIAGNOSIS — Z0000 Encounter for general adult medical examination without abnormal findings: Secondary | ICD-10-CM | POA: Diagnosis not present

## 2022-05-23 ENCOUNTER — Ambulatory Visit: Payer: Medicare Other

## 2022-08-05 ENCOUNTER — Other Ambulatory Visit: Payer: Self-pay | Admitting: Family Medicine

## 2022-08-05 DIAGNOSIS — M810 Age-related osteoporosis without current pathological fracture: Secondary | ICD-10-CM

## 2022-08-07 NOTE — Telephone Encounter (Signed)
last RF 04/17/22 #12 1 RF   Requested Prescriptions  Refused Prescriptions Disp Refills  . alendronate (FOSAMAX) 70 MG tablet [Pharmacy Med Name: Alendronate Sodium 70 MG Oral Tablet] 12 tablet 0    Sig: TAKE 1 TABLET (70 MG TOTAL) BY MOUTH EVERY 7 DAYS - TAKE WITH A FULL GLASS OF WATER ON AN EMPTY STOMACH     Endocrinology:  Bisphosphonates Failed - 08/05/2022 12:34 PM      Failed - Vitamin D in normal range and within 360 days    No results found for: "LF8101BP1", "WC5852DP8", "EU235TI1WER", "25OHVITD3", "25OHVITD2", "25OHVITD1", "VD25OH"       Failed - Cr in normal range and within 360 days    Creatinine  Date Value Ref Range Status  06/26/2012 1.04 0.60 - 1.30 mg/dL Final   Creatinine, Ser  Date Value Ref Range Status  04/17/2022 1.67 (H) 0.57 - 1.00 mg/dL Final         Failed - Mg Level in normal range and within 360 days    No results found for: "MG"       Failed - Bone Mineral Density or Dexa Scan completed in the last 2 years      Passed - Ca in normal range and within 360 days    Calcium  Date Value Ref Range Status  04/17/2022 10.3 8.7 - 10.3 mg/dL Final   Calcium, Total  Date Value Ref Range Status  06/26/2012 10.6 (H) 8.5 - 10.1 mg/dL Final         Passed - Phosphate in normal range and within 360 days    Phosphorus  Date Value Ref Range Status  04/17/2022 3.4 3.0 - 4.3 mg/dL Final         Passed - eGFR is 30 or above and within 360 days    EGFR (African American)  Date Value Ref Range Status  06/26/2012 >60  Final   GFR calc Af Amer  Date Value Ref Range Status  06/14/2020 >60 >60 mL/min Final   EGFR (Non-African Amer.)  Date Value Ref Range Status  06/26/2012 56 (L)  Final    Comment:    eGFR values <60m/min/1.73 m2 may be an indication of chronic kidney disease (CKD). Calculated eGFR is useful in patients with stable renal function. The eGFR calculation will not be reliable in acutely ill patients when serum creatinine is changing rapidly. It  is not useful in  patients on dialysis. The eGFR calculation may not be applicable to patients at the low and high extremes of body sizes, pregnant women, and vegetarians.    GFR calc non Af Amer  Date Value Ref Range Status  06/14/2020 >60 >60 mL/min Final   eGFR  Date Value Ref Range Status  04/17/2022 32 (L) >59 mL/min/1.73 Final         Passed - Valid encounter within last 12 months    Recent Outpatient Visits          3 months ago Hypothyroidism, unspecified type   [facility]   9 months ago Type 2 diabetes mellitus without complication, without long-term current use of insulin ([place]   [place] [facility]   1 year ago [facility]othyroidism, unspecified type   [facility]   1 year ago Type 2 diabetes mellitus without complication, without  long-term current use of insulin ([place])   [facility], MD   1 year ago Type 2 diabetes mellitus without complication, without long-term current use of insulin ([place])   [facility] [place] [place], MD      Future Appointments            In 2 months Juline Patch, MD [place] [facility] at [facility]

## 2022-08-23 DIAGNOSIS — E785 Hyperlipidemia, unspecified: Secondary | ICD-10-CM | POA: Diagnosis not present

## 2022-08-23 DIAGNOSIS — I2510 Atherosclerotic heart disease of native coronary artery without angina pectoris: Secondary | ICD-10-CM | POA: Diagnosis not present

## 2022-08-23 DIAGNOSIS — I10 Essential (primary) hypertension: Secondary | ICD-10-CM | POA: Diagnosis not present

## 2022-08-23 DIAGNOSIS — I639 Cerebral infarction, unspecified: Secondary | ICD-10-CM | POA: Diagnosis not present

## 2022-08-23 DIAGNOSIS — I219 Acute myocardial infarction, unspecified: Secondary | ICD-10-CM | POA: Diagnosis not present

## 2022-10-17 ENCOUNTER — Ambulatory Visit (INDEPENDENT_AMBULATORY_CARE_PROVIDER_SITE_OTHER): Payer: Medicare Other | Admitting: Family Medicine

## 2022-10-17 ENCOUNTER — Encounter: Payer: Self-pay | Admitting: Family Medicine

## 2022-10-17 VITALS — BP 100/60 | HR 72 | Ht 61.0 in | Wt 126.0 lb

## 2022-10-17 DIAGNOSIS — R5383 Other fatigue: Secondary | ICD-10-CM

## 2022-10-17 DIAGNOSIS — E039 Hypothyroidism, unspecified: Secondary | ICD-10-CM

## 2022-10-17 DIAGNOSIS — M810 Age-related osteoporosis without current pathological fracture: Secondary | ICD-10-CM | POA: Diagnosis not present

## 2022-10-17 DIAGNOSIS — E119 Type 2 diabetes mellitus without complications: Secondary | ICD-10-CM

## 2022-10-17 MED ORDER — ALENDRONATE SODIUM 70 MG PO TABS: 70.0000 mg | ORAL_TABLET | ORAL | 1 refills | Status: DC

## 2022-10-17 NOTE — Progress Notes (Signed)
Date:  10/17/2022   Name:  Patricia Lawson   DOB:  31-Jan-1946   MRN:  [id number]   Chief Complaint: Diabetes and Hypothyroidism  Diabetes She presents for her follow-up diabetic visit. Diabetes type: prediabetes. Her disease course has been stable. Pertinent negatives for hypoglycemia include no nervousness/anxiousness or tremors. Pertinent negatives for diabetes include no blurred vision, no chest pain, no fatigue, no polydipsia, no polyuria, no visual change, no weakness and no weight loss. There are no hypoglycemic complications. Symptoms are stable. There are no diabetic complications. Her weight is stable. An ACE inhibitor/angiotensin II receptor blocker is being taken.  Thyroid Problem Presents for follow-up visit. Patient reports no anxiety, cold intolerance, constipation, depressed mood, diaphoresis, diarrhea, dry skin, fatigue, hair loss, heat intolerance, hoarse voice, leg swelling, menstrual problem, nail problem, palpitations, tremors, visual change, weight gain or weight loss. The symptoms have been stable.    Lab Results  Component Value Date   NA 139 04/17/2022   K 5.2 04/17/2022   CO2 21 04/17/2022   GLUCOSE 104 (H) 04/17/2022   BUN 25 04/17/2022   CREATININE 1.67 (H) 04/17/2022   CALCIUM 10.3 04/17/2022   EGFR 32 (L) 04/17/2022   GFRNONAA >60 06/14/2020   Lab Results  Component Value Date   CHOL 167 10/17/2021   HDL 42 10/17/2021   LDLCALC 83 10/17/2021   TRIG 252 (H) 10/17/2021   CHOLHDL 4.9 06/21/2018   Lab Results  Component Value Date   TSH 1.560 04/17/2022   Lab Results  Component Value Date   HGBA1C 6.2 (H) 04/17/2022   Lab Results  Component Value Date   WBC 12.2 (H) 06/20/2018   HGB 14.1 06/20/2018   HCT 40.2 06/20/2018   MCV 89.3 06/20/2018   PLT 213 06/20/2018   Lab Results  Component Value Date   ALT 14 06/12/2019   AST 18 06/12/2019   ALKPHOS 113 06/12/2019   BILITOT 0.5 06/12/2019   No results found for: "25OHVITD2",  "25OHVITD3", "VD25OH"   Review of Systems  Constitutional:  Negative for diaphoresis, fatigue, weight gain and weight loss.  HENT:  Negative for hoarse voice.   Eyes:  Negative for blurred vision.  Cardiovascular:  Negative for chest pain and palpitations.  Gastrointestinal:  Negative for blood in stool, constipation and diarrhea.  Endocrine: Negative for cold intolerance, heat intolerance, polydipsia and polyuria.  Genitourinary:  Negative for menstrual problem.  Neurological:  Negative for tremors and weakness.  Psychiatric/Behavioral:  The patient is not nervous/anxious.     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   Fall 06/21/2018   RUE weakness 06/20/2018   Facial laceration 06/20/2018   HTN (hypertension) 06/20/2018   CAD (coronary artery disease) 06/20/2018   UTI (urinary tract infection) 06/20/2018   Patella fracture 04/05/2018   Appendicitis, acute 03/04/2015    No Known Allergies  Past Surgical History:  Procedure Laterality Date   CARDIAC CATHETERIZATION  2010   5 stents placed   CATARACT EXTRACTION W/ INTRAOCULAR LENS  IMPLANT, BILATERAL     HARDWARE  REMOVAL Right 12/11/2018   Procedure: SCREW REMOVAL FROM PATELLA;  Surgeon: Corky Mull, MD;  Location: [place];  Service: Orthopedics;  Laterality: Right;  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   LAPAROSCOPIC APPENDECTOMY N/A 03/04/2015   Procedure: APPENDECTOMY LAPAROSCOPIC;  Surgeon: Sherri Rad, MD;  Location: [facility];  Service: General;  Laterality: N/A;   ORIF PATELLA Right 04/06/2018   Procedure: OPEN REDUCTION INTERNAL (ORIF) FIXATION PATELLA;  Surgeon: Leim Fabry, MD;  Location: [facility];  Service: Orthopedics;  Laterality: Right;   STENT PLACE LEFT URETER ([place] HX)     cardiac   TENDON EXPLORATION Right 12/11/2018   Procedure: EXPLORATION OF PERONEAL NERVE LATERAL KNEE;  Surgeon: Corky Mull, MD;  Location: [place];  Service: Orthopedics;  Laterality: Right;  Diabetic - oral meds   THYROIDECTOMY Bilateral     Social History   Tobacco Use   Smoking status: Former    Types: Cigarettes    Quit date: 03/03/2009    Years since quitting: 13.6   Smokeless tobacco: Never  Vaping Use   Vaping Use: Never used  Substance Use Topics   Alcohol use: No   Drug use: Never     Medication list has been reviewed and updated.  Current Meds  Medication Sig   alendronate (FOSAMAX) 70 MG tablet Take 1 tablet (70 mg total) by mouth every 7 (seven) days. Take with a full glass of water on an empty stomach.   aspirin EC 81 MG EC tablet Take 1 tablet (81 mg total) by mouth daily.   atorvastatin (LIPITOR) 80 MG tablet Take 1 tablet (80 mg total) by mouth daily. Dr Clayborn Bigness   Cholecalciferol 50 MCG (2000 UT) CAPS Take 1 capsule by mouth daily.   irbesartan (AVAPRO) 75 MG tablet Take 75 mg by mouth daily. Dr Clayborn Bigness   levothyroxine (EUTHYROX) 50 MCG tablet Take 1 tablet (50 mcg total) by mouth daily.   metFORMIN (GLUCOPHAGE-XR) 750 MG 24 hr tablet Take 1 tablet (750 mg total) by mouth daily with breakfast.   metoprolol succinate (TOPROL-XL) 100 MG 24 hr tablet Take 100 mg by mouth daily. Dr Clayborn Bigness       04/17/2022   10:11 AM 10/17/2021    8:59 AM 06/14/2021   10:08 AM 04/01/2021   10:05 AM  GAD 7 : Generalized Anxiety Score  Nervous, Anxious, on Edge 0 0 0 0  Control/stop worrying 0 0 0 0  Worry too much - different things 0 0 0 0  Trouble relaxing 0 0 0 0  Restless 0 0 0 0  Easily  annoyed or irritable 0 0 0 0  Afraid - awful might happen 0 0 0 0  Total GAD 7 Score 0 0 0 0  Anxiety Difficulty Not difficult at all          04/24/2022    3:44 PM 04/17/2022   10:11 AM 10/17/2021    8:59 AM  Depression screen PHQ 2/9  Decreased Interest 0 0 0  Down, Depressed, Hopeless 0 0 0  PHQ - 2 Score 0 0 0  Altered sleeping  0 0  Tired, decreased energy  0 0  Change in appetite  0 0  Feeling bad or failure about yourself   0 0  Trouble concentrating  0 0  Moving slowly or fidgety/restless  0 0  Suicidal thoughts  0 0  PHQ-9 Score  0 0  Difficult doing work/chores  Not difficult at all  BP Readings from Last 3 Encounters:  10/17/22 100/60  04/17/22 122/80  10/17/21 120/80    Physical Exam Vitals and nursing note reviewed. Exam conducted with a chaperone present.  Constitutional:      General: She is not in acute distress.    Appearance: She is not diaphoretic.  HENT:     Head: Normocephalic and atraumatic.     Right Ear: Tympanic membrane and external ear normal.     Left Ear: Tympanic membrane and external ear normal.     Nose: Nose normal.     Mouth/Throat:     Mouth: Mucous membranes are moist.  Eyes:     General:        Right eye: No discharge.        Left eye: No discharge.     Conjunctiva/sclera: Conjunctivae normal.     Pupils: Pupils are equal, round, and reactive to light.  Neck:     Thyroid: No thyromegaly.     Vascular: No JVD.  Cardiovascular:     Rate and Rhythm: Normal rate and regular rhythm.     Heart sounds: Normal heart sounds. No murmur heard.    No friction rub. No gallop.  Pulmonary:     Effort: Pulmonary effort is normal.     Breath sounds: Normal breath sounds. No wheezing, rhonchi or rales.  Abdominal:     General: Bowel sounds are normal.     Palpations: Abdomen is soft. There is no mass.     Tenderness: There is no abdominal tenderness. There is no guarding.  Musculoskeletal:        General: Normal range of motion.      Cervical back: Normal range of motion and neck supple.  Lymphadenopathy:     Cervical: No cervical adenopathy.  Skin:    General: Skin is warm and dry.  Neurological:     Mental Status: She is alert.     Deep Tendon Reflexes: Reflexes are normal and symmetric.     Wt Readings from Last 3 Encounters:  10/17/22 126 lb (57.2 kg)  04/17/22 128 lb (58.1 kg)  10/17/21 137 lb (62.1 kg)    BP 100/60   Pulse 72   Ht _0  (1.549 m)   Wt 126 lb (57.2 kg)   SpO2 95%   BMI 23.81 kg/m   Assessment and Plan:  1. Type 2 diabetes mellitus without complication, without long-term current use of insulin (HCC) Chronic.  Controlled.  Stable.  Currently is on metformin XR 750 mg once a day.  Will check A1c and microalbuminuria for current status. - Hemoglobin A1c - Microalbumin / creatinine urine ratio  2. Age-related osteoporosis without current pathological fracture Chronic.  Stable.  No noncontact fractures noted over the past 6 months.  Patient will continue on Fosamax 70 mg once a week and will recheck in 6 months. - alendronate (FOSAMAX) 70 MG tablet; Take 1 tablet (70 mg total) by mouth every 7 (seven) days. Take with a full glass of water on an empty stomach.  Dispense: 12 tablet; Refill: 1 - CBC with Differential/Platelet  3. Hypothyroidism, unspecified type Chronic.  Controlled.  Stable.  Patient has had a thyroidectomy and is currently being supplemented with levothyroxine currently at 50 mcg daily.  There was a 2 pound weight loss noted and patient is generally having some fatigue we will check TSH to see if we may be overcompensating. - TSH  4. Fatigue, unspecified type New onset.  Persistent.  Episodic.  There is some paleness to the conjunctiva we will check CBC for anemia as well. - TSH - CBC with Differential/Platelet    Otilio Miu, MD

## 2022-10-18 LAB — CBC WITH DIFFERENTIAL/PLATELET
Basophils Absolute: 0 10*3/uL (ref 0.0–0.2)
Basos: 0 %
EOS (ABSOLUTE): 0 10*3/uL (ref 0.0–0.4)
Eos: 0 %
Hematocrit: 33.3 % — ABNORMAL LOW (ref 34.0–46.6)
Hemoglobin: 11.3 g/dL (ref 11.1–15.9)
Immature Grans (Abs): 0 10*3/uL (ref 0.0–0.1)
Immature Granulocytes: 0 %
Lymphocytes Absolute: 1.5 10*3/uL (ref 0.7–3.1)
Lymphs: 19 %
MCH: 30.8 pg (ref 26.6–33.0)
MCHC: 33.9 g/dL (ref 31.5–35.7)
MCV: 91 fL (ref 79–97)
Monocytes Absolute: 1.1 10*3/uL — ABNORMAL HIGH (ref 0.1–0.9)
Monocytes: 15 %
Neutrophils Absolute: 5.2 10*3/uL (ref 1.4–7.0)
Neutrophils: 66 %
Platelets: 180 10*3/uL (ref 150–450)
RBC: 3.67 x10E6/uL — ABNORMAL LOW (ref 3.77–5.28)
RDW: 11.8 % (ref 11.7–15.4)
WBC: 7.9 10*3/uL (ref 3.4–10.8)

## 2022-10-18 LAB — MICROALBUMIN / CREATININE URINE RATIO
Creatinine, Urine: 378 mg/dL
Microalb/Creat Ratio: 55 mg/g creat — ABNORMAL HIGH (ref 0–29)
Microalbumin, Urine: 206.7 ug/mL

## 2022-10-18 LAB — HEMOGLOBIN A1C
Est. average glucose Bld gHb Est-mCnc: 131 mg/dL
Hgb A1c MFr Bld: 6.2 % — ABNORMAL HIGH (ref 4.8–5.6)

## 2022-10-18 LAB — TSH: TSH: 0.786 u[IU]/mL (ref 0.450–4.500)

## 2022-11-11 ENCOUNTER — Other Ambulatory Visit: Payer: Self-pay | Admitting: Family Medicine

## 2022-11-11 DIAGNOSIS — E039 Hypothyroidism, unspecified: Secondary | ICD-10-CM

## 2022-11-11 DIAGNOSIS — E119 Type 2 diabetes mellitus without complications: Secondary | ICD-10-CM

## 2022-11-13 NOTE — Telephone Encounter (Signed)
Requested Prescriptions  Pending Prescriptions Disp Refills   levothyroxine (SYNTHROID) 50 MCG tablet [Pharmacy Med Name: Levothyroxine Sodium 50 MCG Oral Tablet] 90 tablet 0    Sig: Take 1 tablet by mouth once daily     Endocrinology:  Hypothyroid Agents Passed - 11/11/2022  8:52 AM      Passed - TSH in normal range and within 360 days    TSH  Date Value Ref Range Status  10/17/2022 0.786 0.450 - 4.500 uIU/mL Final         Passed - Valid encounter within last 12 months    Recent Outpatient Visits           3 weeks ago Type 2 diabetes mellitus without complication, without long-term current use of insulin ([place])   [facility] [place], Deanna C, MD   7 months ago Hypothyroidism, unspecified type   [facility] at [facility], MD   1 year ago Type 2 diabetes mellitus without complication, without long-term current use of insulin ([place])   [place] [facility] at [place], Deanna C, MD   1 year ago Hypothyroidism, unspecified type   [facility] at [facility], MD   1 year ago Type 2 diabetes mellitus without complication, without long-term current use of insulin ([place])   [facility] [facility] at [facility], MD       Future Appointments             In 3 months Juline Patch, MD [facility] at [place], [place]             metFORMIN (GLUCOPHAGE-XR) 750 MG 24 hr tablet [Pharmacy Med Name: metFORMIN HCl ER 750 MG Oral Tablet Extended Release 24 Hour] 90 tablet 0    Sig: Take 1 tablet by mouth once daily with breakfast     Endocrinology:  Diabetes - Biguanides Failed - 11/11/2022  8:52 AM      Failed - Cr in normal range and within 360 days    Creatinine  Date Value Ref Range Status  06/26/2012 1.04 0.60 -  1.30 mg/dL Final   Creatinine, Ser  Date Value Ref Range Status  04/17/2022 1.67 (H) 0.57 - 1.00 mg/dL Final         Failed - eGFR in normal range and within 360 days    EGFR (African American)  Date Value Ref Range Status  06/26/2012 >60  Final   GFR calc Af Amer  Date Value Ref Range Status  06/14/2020 >60 >60 mL/min Final   EGFR (Non-African Amer.)  Date Value Ref Range Status  06/26/2012 56 (L)  Final    Comment:    eGFR values <73m/min/1.73 m2 may be an indication of chronic kidney disease (CKD). Calculated eGFR is useful in patients with stable renal function. The eGFR calculation will not be reliable in acutely ill patients when serum creatinine is changing rapidly. It is not useful in  patients on dialysis. The eGFR calculation may not be applicable to patients at the low and high extremes of body sizes, pregnant women, and vegetarians.    GFR calc non Af Amer  Date Value Ref Range Status  06/14/2020 >60 >60 mL/min Final   eGFR  Date Value Ref Range Status  04/17/2022 32 (L) >  59 mL/min/1.73 Final         Failed - B12 Level in normal range and within 720 days    No results found for: "VITAMINB12"       Passed - HBA1C is between 0 and 7.9 and within 180 days    Hgb A1c MFr Bld  Date Value Ref Range Status  10/17/2022 6.2 (H) 4.8 - 5.6 % Final    Comment:             Prediabetes: 5.7 - 6.4          Diabetes: >6.4          Glycemic control for adults with diabetes: <7.0          Passed - Valid encounter within last 6 months    Recent Outpatient Visits           3 weeks ago Type 2 diabetes mellitus without complication, without long-term current use of insulin ([place])   [facility] [place], Deanna C, MD   7 months ago Hypothyroidism, unspecified type   [facility] at [place], Deanna C, MD   1 year ago Type 2 diabetes mellitus without complication, without  long-term current use of insulin ([place])   [facility] at [place], Deanna C, MD   1 year ago Hypothyroidism, unspecified type   [place] [facility] at [place], Deanna C, MD   1 year ago Type 2 diabetes mellitus without complication, without long-term current use of insulin ([place])   [facility] at [place], [place], MD       Future Appointments             In 3 months Juline Patch, MD [facility] at [facility], [place] within normal limits and completed in the last 12 months    WBC  Date Value Ref Range Status  10/17/2022 7.9 3.4 - 10.8 x10E3/uL Final  06/20/2018 12.2 (H) 3.6 - 11.0 K/uL Final   RBC  Date Value Ref Range Status  10/17/2022 3.67 (L) 3.77 - 5.28 x10E6/uL Final  06/20/2018 4.50 3.80 - 5.20 MIL/uL Final   Hemoglobin  Date Value Ref Range Status  10/17/2022 11.3 11.1 - 15.9 g/dL Final   Hematocrit  Date Value Ref Range Status  10/17/2022 33.3 (L) 34.0 - 46.6 % Final   MCHC  Date Value Ref Range Status  10/17/2022 33.9 31.5 - 35.7 g/dL Final  06/20/2018 35.1 32.0 - 36.0 g/dL Final   [facility]  Date Value Ref Range Status  10/17/2022 30.8 26.6 - 33.0 pg Final  06/20/2018 31.4 26.0 - 34.0 pg Final   MCV  Date Value Ref Range Status  10/17/2022 91 79 - 97 fL Final   No results found for: "PLTCOUNTKUC", "LABPLAT", "POCPLA" RDW  Date Value Ref Range Status  10/17/2022 11.8 11.7 - 15.4 % Final

## 2023-02-03 ENCOUNTER — Other Ambulatory Visit: Payer: Self-pay | Admitting: Family Medicine

## 2023-02-03 DIAGNOSIS — E039 Hypothyroidism, unspecified: Secondary | ICD-10-CM

## 2023-02-05 NOTE — Telephone Encounter (Signed)
Requested Prescriptions  Pending Prescriptions Disp Refills   levothyroxine (SYNTHROID) 50 MCG tablet [Pharmacy Med Name: Levothyroxine Sodium 50 MCG Oral Tablet] 90 tablet 0    Sig: Take 1 tablet by mouth once daily     Endocrinology:  Hypothyroid Agents Passed - 02/03/2023  8:20 AM      Passed - TSH in normal range and within 360 days    TSH  Date Value Ref Range Status  10/17/2022 0.786 0.450 - 4.500 uIU/mL Final         Passed - Valid encounter within last 12 months    Recent Outpatient Visits           3 months ago Type 2 diabetes mellitus without complication, without long-term current use of insulin ([facility])   [place] [facility], MD   9 months ago Hypothyroidism, unspecified type   [facility] at [facility], MD   1 year ago Type 2 diabetes mellitus without complication, without long-term current use of insulin (HCC)   [place] [facility], MD   1 year ago Hypothyroidism, unspecified type   [facility] at [facility], MD   1 year ago Type 2 diabetes mellitus without complication, without long-term current use of insulin (HCC)   [place] [facility], MD       Future Appointments             In 1 week Duanne Limerick, MD [facility] at [facility], [facility]

## 2023-02-16 ENCOUNTER — Ambulatory Visit: Payer: Medicare Other | Admitting: Family Medicine

## 2023-02-19 ENCOUNTER — Encounter: Payer: Self-pay | Admitting: Family Medicine

## 2023-02-19 ENCOUNTER — Ambulatory Visit (INDEPENDENT_AMBULATORY_CARE_PROVIDER_SITE_OTHER): Payer: Medicare Other | Admitting: Family Medicine

## 2023-02-19 VITALS — BP 120/70 | HR 74 | Ht 61.0 in | Wt 120.0 lb

## 2023-02-19 DIAGNOSIS — E039 Hypothyroidism, unspecified: Secondary | ICD-10-CM | POA: Diagnosis not present

## 2023-02-19 DIAGNOSIS — E782 Mixed hyperlipidemia: Secondary | ICD-10-CM | POA: Diagnosis not present

## 2023-02-19 DIAGNOSIS — E119 Type 2 diabetes mellitus without complications: Secondary | ICD-10-CM | POA: Diagnosis not present

## 2023-02-19 MED ORDER — METFORMIN HCL ER 750 MG PO TB24: 750.0000 mg | ORAL_TABLET | Freq: Every day | ORAL | 1 refills | Status: DC

## 2023-02-19 NOTE — Progress Notes (Signed)
Date:  02/19/2023   Name:  Patricia Lawson   DOB:  10-31-1945   MRN:  [id number]   Chief Complaint: Prediabetes  Diabetes She presents for her follow-up diabetic visit. She has type 2 diabetes mellitus. Her disease course has been fluctuating. There are no hypoglycemic associated symptoms. Pertinent negatives for hypoglycemia include no confusion, dizziness, headaches, hunger, mood changes, nervousness/anxiousness, pallor, seizures, sleepiness, speech difficulty, sweats or tremors. Associated symptoms include weight loss. Pertinent negatives for diabetes include no blurred vision, no chest pain, no fatigue, no polydipsia, no polyuria, no visual change and no weakness. There are no hypoglycemic complications. Pertinent negatives for hypoglycemia complications include no blackouts, no hospitalization and no required glucagon injection. Symptoms are stable. There are no diabetic complications. There are no known risk factors for coronary artery disease. When asked about current treatments, none were reported. Her weight is stable. She is following a generally healthy diet. Meal planning includes avoidance of concentrated sweets and carbohydrate counting. She participates in exercise intermittently. Her home blood glucose trend is fluctuating minimally. Her breakfast blood glucose is taken between 8-9 am. Her breakfast blood glucose range is generally 90-110 mg/dl. An ACE inhibitor/angiotensin II receptor blocker is being taken.  Thyroid Problem Presents for follow-up visit. Symptoms include weight loss. Patient reports no anxiety, cold intolerance, constipation, depressed mood, diaphoresis, diarrhea, dry skin, fatigue, hair loss, heat intolerance, palpitations, tremors or visual change. Symptom course: ? wt loss unintentional.    Lab Results  Component Value Date   NA 139 04/17/2022   K 5.2 04/17/2022   CO2 21 04/17/2022   GLUCOSE 104 (H) 04/17/2022   BUN 25 04/17/2022   CREATININE 1.67 (H)  04/17/2022   CALCIUM 10.3 04/17/2022   EGFR 32 (L) 04/17/2022   GFRNONAA >60 06/14/2020   Lab Results  Component Value Date   CHOL 167 10/17/2021   HDL 42 10/17/2021   LDLCALC 83 10/17/2021   TRIG 252 (H) 10/17/2021   CHOLHDL 4.9 06/21/2018   Lab Results  Component Value Date   TSH 0.786 10/17/2022   Lab Results  Component Value Date   HGBA1C 6.2 (H) 10/17/2022   Lab Results  Component Value Date   WBC 7.9 10/17/2022   HGB 11.3 10/17/2022   HCT 33.3 (L) 10/17/2022   MCV 91 10/17/2022   PLT 180 10/17/2022   Lab Results  Component Value Date   ALT 14 06/12/2019   AST 18 06/12/2019   ALKPHOS 113 06/12/2019   BILITOT 0.5 06/12/2019   No results found for: "25OHVITD2", "25OHVITD3", "VD25OH"   Review of Systems  Constitutional:  Positive for weight loss. Negative for chills, diaphoresis, fatigue, fever and unexpected weight change.  HENT:  Negative for congestion, ear discharge, ear pain, rhinorrhea, sinus pressure, sneezing and sore throat.   Eyes:  Negative for blurred vision.  Respiratory:  Negative for cough, chest tightness, shortness of breath, wheezing and stridor.   Cardiovascular:  Negative for chest pain and palpitations.  Gastrointestinal:  Negative for abdominal distention, abdominal pain, blood in stool, constipation, diarrhea and nausea.  Endocrine: Negative for cold intolerance, heat intolerance, polydipsia and polyuria.  Genitourinary:  Negative for dysuria, flank pain, frequency, hematuria, pelvic pain, urgency and vaginal discharge.  Musculoskeletal:  Negative for arthralgias, back pain and myalgias.  Skin:  Negative for pallor and rash.  Neurological:  Negative for dizziness, tremors, seizures, speech difficulty, weakness and headaches.  Hematological:  Negative for adenopathy. Does not bruise/bleed easily.  Psychiatric/Behavioral:  Negative for  confusion and dysphoric mood. The patient is not nervous/anxious.     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   Fall 06/21/2018   RUE weakness 06/20/2018   Facial laceration 06/20/2018   HTN (hypertension) 06/20/2018   CAD (coronary artery disease) 06/20/2018   UTI (urinary tract infection) 06/20/2018   Patella fracture 04/05/2018   Appendicitis, acute 03/04/2015    No Known Allergies  Past Surgical History:  Procedure Laterality Date   CARDIAC CATHETERIZATION  2010   5 stents placed   CATARACT EXTRACTION W/ INTRAOCULAR LENS  IMPLANT, BILATERAL     HARDWARE REMOVAL Right 12/11/2018   Procedure: SCREW REMOVAL FROM PATELLA;  Surgeon: Christena Flake, MD;  [facility];  Service: Orthopedics;  Laterality: Right;  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   LAPAROSCOPIC APPENDECTOMY N/A 03/04/2015   Procedure: APPENDECTOMY LAPAROSCOPIC;  Surgeon: Natale Lay, MD;  Location: [facility];  Service: General;  Laterality: N/A;   ORIF PATELLA Right 04/06/2018   Procedure: OPEN REDUCTION INTERNAL (ORIF) FIXATION PATELLA;  Surgeon: Signa Kell, MD;  Location: [facility];  Service: Orthopedics;  Laterality: Right;   STENT PLACE LEFT URETER ([facility] HX)     cardiac   TENDON EXPLORATION Right 12/11/2018   Procedure: EXPLORATION OF PERONEAL NERVE LATERAL KNEE;  Surgeon: Christena Flake, MD;  Location: [facility];  Service: Orthopedics;  Laterality: Right;  Diabetic - oral meds   THYROIDECTOMY Bilateral     Social History   Tobacco Use   Smoking status: Former    Types: Cigarettes    Quit date: 03/03/2009    Years since  quitting: 13.9   Smokeless tobacco: Never  Vaping Use   Vaping Use: Never used  Substance Use Topics   Alcohol use: No   Drug use: Never     Medication list has been reviewed and updated.  Current Meds  Medication Sig   alendronate (FOSAMAX) 70 MG tablet Take 1 tablet (70 mg total) by mouth every 7 (seven) days. Take with a full glass of water on an empty stomach.   aspirin EC 81 MG EC tablet Take 1 tablet (81 mg total) by mouth daily.   atorvastatin (LIPITOR) 80 MG tablet Take 1 tablet (80 mg total) by mouth daily. Dr Juliann Pares   Cholecalciferol 50 MCG (2000 UT) CAPS Take 1 capsule by mouth daily.   irbesartan (AVAPRO) 75 MG tablet Take 75 mg by mouth daily. Dr Juliann Pares   levothyroxine (SYNTHROID) 50 MCG tablet Take 1 tablet by mouth once daily   metFORMIN (GLUCOPHAGE-XR) 750 MG 24 hr tablet Take 1 tablet by mouth once daily with breakfast   metoprolol succinate (TOPROL-XL) 100 MG 24 hr tablet Take 100 mg by mouth daily. Dr Juliann Pares       02/19/2023   10:43 AM 04/17/2022   10:11 AM 10/17/2021    8:59 AM 06/14/2021   10:08 AM  GAD 7 : Generalized Anxiety Score  Nervous, Anxious, on Edge 0 0 0 0  Control/stop worrying 0 0 0 0  Worry too much - different things 0 0 0  0  Trouble relaxing 0 0 0 0  Restless 0 0 0 0  Easily annoyed or irritable 0 0 0 0  Afraid - awful might happen 0 0 0 0  Total GAD 7 Score 0 0 0 0  Anxiety Difficulty Not difficult at all Not difficult at all         02/19/2023   10:43 AM 04/24/2022    3:44 PM 04/17/2022   10:11 AM  Depression screen PHQ 2/9  Decreased Interest 0 0 0  Down, Depressed, Hopeless 0 0 0  PHQ - 2 Score 0 0 0  Altered sleeping 0  0  Tired, decreased energy 0  0  Change in appetite 0  0  Feeling bad or failure about yourself  0  0  Trouble concentrating 0  0  Moving slowly or fidgety/restless 0  0  Suicidal thoughts 0  0  PHQ-9 Score 0  0  Difficult doing work/chores Not difficult at all  Not difficult at all    BP Readings  from Last 3 Encounters:  02/19/23 120/70  10/17/22 100/60  04/17/22 122/80    Physical Exam Vitals and nursing note reviewed.  HENT:     Right Ear: Tympanic membrane normal.     Left Ear: Tympanic membrane normal.     Nose: Nose normal.     Mouth/Throat:     Mouth: Mucous membranes are moist.  Eyes:     Pupils: Pupils are equal, round, and reactive to light.  Cardiovascular:     Rate and Rhythm: Normal rate and regular rhythm.     Heart sounds: Normal heart sounds. No murmur heard.    No gallop.  Pulmonary:     Effort: No respiratory distress.     Breath sounds: No stridor. No wheezing, rhonchi or rales.  Abdominal:     General: Abdomen is flat.  Musculoskeletal:     Cervical back: Normal range of motion. No tenderness.  Skin:    General: Skin is warm.     Coloration: Skin is not jaundiced or pale.     Findings: No bruising, erythema, lesion or rash.     Wt Readings from Last 3 Encounters:  02/19/23 120 lb (54.4 kg)  10/17/22 126 lb (57.2 kg)  04/17/22 128 lb (58.1 kg)    BP 120/70   Pulse 74   Ht 5\' 1"  (1.549 m)   Wt 120 lb (54.4 kg)   SpO2 98%   BMI 22.67 kg/m   Assessment and Plan: 1. Type 2 diabetes mellitus without complication, without long-term current use of insulin Chronic.  Controlled.  Stable.  Patient states that fasting blood sugars are in the 1 10-1 20 range for the most part.  Continue metformin XR 750 once a day will check CMP for electrolytes fasting glucose. - metFORMIN (GLUCOPHAGE-XR) 750 MG 24 hr tablet; Take 1 tablet (750 mg total) by mouth daily with breakfast.  Dispense: 90 tablet; Refill: 1 - Comprehensive Metabolic Panel (CMET)  2. Hypothyroidism, unspecified type Chronic.  Relatively controlled.  Last measurement of TSH noted 8.78 dropped from 1.6 range.  Unintentional weight loss that was noted from 1 26-1 20 may be secondary to overactive thyroid or over supplemented.  We will check a TSH to see if this may be going on and that there  was some fatigue in the past. - TSH  3. Mixed hyperlipidemia Chronic.  Controlled.  Stable.  Continue atorvastatin at current dosing. - Lipid Panel With LDL/HDL Ratio  Otilio Miu, MD

## 2023-02-20 ENCOUNTER — Other Ambulatory Visit: Payer: Self-pay

## 2023-02-20 DIAGNOSIS — I77819 Aortic ectasia, unspecified site: Secondary | ICD-10-CM

## 2023-02-20 LAB — COMPREHENSIVE METABOLIC PANEL
ALT: 12 IU/L (ref 0–32)
AST: 23 IU/L (ref 0–40)
Albumin/Globulin Ratio: 2 (ref 1.2–2.2)
Albumin: 4.6 g/dL (ref 3.8–4.8)
Alkaline Phosphatase: 68 IU/L (ref 44–121)
BUN/Creatinine Ratio: 11 — ABNORMAL LOW (ref 12–28)
BUN: 15 mg/dL (ref 8–27)
Bilirubin Total: 0.4 mg/dL (ref 0.0–1.2)
CO2: 20 mmol/L (ref 20–29)
Calcium: 10.3 mg/dL (ref 8.7–10.3)
Chloride: 103 mmol/L (ref 96–106)
Creatinine, Ser: 1.32 mg/dL — ABNORMAL HIGH (ref 0.57–1.00)
Globulin, Total: 2.3 g/dL (ref 1.5–4.5)
Glucose: 97 mg/dL (ref 70–99)
Potassium: 5.3 mmol/L — ABNORMAL HIGH (ref 3.5–5.2)
Sodium: 139 mmol/L (ref 134–144)
Total Protein: 6.9 g/dL (ref 6.0–8.5)
eGFR: 42 mL/min/{1.73_m2} — ABNORMAL LOW (ref >59)

## 2023-02-20 LAB — TSH: TSH: 0.809 u[IU]/mL (ref 0.450–4.500)

## 2023-02-20 LAB — LIPID PANEL WITH LDL/HDL RATIO
Cholesterol, Total: 145 mg/dL (ref 100–199)
HDL: 55 mg/dL (ref >39)
LDL Chol Calc (NIH): 63 mg/dL (ref 0–99)
LDL/HDL Ratio: 1.1 ratio (ref 0.0–3.2)
Triglycerides: 158 mg/dL — ABNORMAL HIGH (ref 0–149)
VLDL Cholesterol Cal: 27 mg/dL (ref 5–40)

## 2023-02-20 NOTE — Progress Notes (Signed)
[place] order placed AAA

## 2023-02-26 ENCOUNTER — Ambulatory Visit
Admission: RE | Admit: 2023-02-26 | Discharge: 2023-02-26 | Disposition: A | Discharge status: Home / Self Care | Payer: Medicare Other | Source: Ambulatory Visit | Attending: Family Medicine | Admitting: Family Medicine

## 2023-02-26 DIAGNOSIS — Z951 Presence of aortocoronary bypass graft: Secondary | ICD-10-CM | POA: Insufficient documentation

## 2023-02-26 DIAGNOSIS — Z87891 Personal history of nicotine dependence: Secondary | ICD-10-CM | POA: Insufficient documentation

## 2023-02-26 DIAGNOSIS — Z8673 Personal history of transient ischemic attack (TIA), and cerebral infarction without residual deficits: Secondary | ICD-10-CM | POA: Diagnosis not present

## 2023-02-26 DIAGNOSIS — I700 Atherosclerosis of aorta: Secondary | ICD-10-CM | POA: Diagnosis not present

## 2023-02-26 DIAGNOSIS — I77819 Aortic ectasia, unspecified site: Secondary | ICD-10-CM | POA: Diagnosis not present

## 2023-02-26 DIAGNOSIS — I10 Essential (primary) hypertension: Secondary | ICD-10-CM | POA: Insufficient documentation

## 2023-03-25 ENCOUNTER — Other Ambulatory Visit: Payer: Self-pay | Admitting: Family Medicine

## 2023-03-25 DIAGNOSIS — M810 Age-related osteoporosis without current pathological fracture: Secondary | ICD-10-CM

## 2023-04-26 ENCOUNTER — Ambulatory Visit (INDEPENDENT_AMBULATORY_CARE_PROVIDER_SITE_OTHER): Payer: Medicare Other

## 2023-04-26 VITALS — Ht 61.0 in | Wt 120.0 lb

## 2023-04-26 DIAGNOSIS — Z0000 Encounter for general adult medical examination without abnormal findings: Secondary | ICD-10-CM

## 2023-04-26 NOTE — Patient Instructions (Signed)
[name] , Thank you for taking time to come for your Medicare Wellness Visit. I appreciate your ongoing commitment to your health goals. Please review the following plan we discussed and let me know if I can assist you in the future.   These are the goals we discussed:  Goals      DIET - EAT MORE FRUITS AND VEGETABLES     Increase physical activity     Pt would like to increase physical activity and walking more.      Weight (lb) < 140 lb (63.5 kg)     Pt states she would like to lose 10 lbs over the next year with diet and exercise        This is a list of the screening recommended for you and due dates:  Health Maintenance  Topic Date Due   Hepatitis C Screening  Never done   Eye exam for diabetics  06/30/2022   COVID-19 Vaccine (4 - 2023-24 season) 06/30/2022   Complete foot exam   04/18/2023   Hemoglobin A1C  04/18/2023   Flu Shot  05/31/2023   Yearly kidney health urinalysis for diabetes  10/18/2023   Yearly kidney function blood test for diabetes  02/19/2024   Medicare Annual Wellness Visit  04/25/2024   DTaP/Tdap/Td vaccine (3 - Td or Tdap) 06/20/2028   Pneumonia Vaccine  Completed   DEXA scan (bone density measurement)  Completed   HPV Vaccine  Aged Out   Zoster (Shingles) Vaccine  Discontinued    Advanced directives: no  Conditions/risks identified: none  Next appointment: Follow up in one year for your annual wellness visit 05/01/24 @ 9:45 am by phone   Preventive Care 65 Years and Older, Female Preventive care refers to lifestyle choices and visits with your health care provider that can promote health and wellness. What does preventive care include? A yearly physical exam. This is also called an annual well check. Dental exams once or twice a year. Routine eye exams. Ask your health care provider how often you should have your eyes checked. Personal lifestyle choices, including: Daily care of your teeth and gums. Regular physical activity. Eating a  healthy diet. Avoiding tobacco and drug use. Limiting alcohol use. Practicing safe sex. Taking low-dose aspirin every day. Taking vitamin and mineral supplements as recommended by your health care provider. What happens during an annual well check? The services and screenings done by your health care provider during your annual well check will depend on your age, overall health, lifestyle risk factors, and family history of disease. Counseling  Your health care provider may ask you questions about your: Alcohol use. Tobacco use. Drug use. Emotional well-being. Home and relationship well-being. Sexual activity. Eating habits. History of falls. Memory and ability to understand (cognition). Work and work Astronomer. Reproductive health. Screening  You may have the following tests or measurements: Height, weight, and BMI. Blood pressure. Lipid and cholesterol levels. These may be checked every 5 years, or more frequently if you are over 38 years old. Skin check. Lung cancer screening. You may have this screening every year starting at age 50 if you have a 30-pack-year history of smoking and currently smoke or have quit within the past 15 years. Fecal occult blood test (FOBT) of the stool. You may have this test every year starting at age 61. Flexible sigmoidoscopy or colonoscopy. You may have a sigmoidoscopy every 5 years or a colonoscopy every 10 years starting at age 10. Hepatitis C blood test.  Hepatitis B blood test. Sexually transmitted disease (STD) testing. Diabetes screening. This is done by checking your blood sugar (glucose) after you have not eaten for a while (fasting). You may have this done every 1-3 years. Bone density scan. This is done to screen for osteoporosis. You may have this done starting at age 110. Mammogram. This may be done every 1-2 years. Talk to your health care provider about how often you should have regular mammograms. Talk with your health care  provider about your test results, treatment options, and if necessary, the need for more tests. Vaccines  Your health care provider may recommend certain vaccines, such as: Influenza vaccine. This is recommended every year. Tetanus, diphtheria, and acellular pertussis (Tdap, Td) vaccine. You may need a Td booster every 10 years. Zoster vaccine. You may need this after age 68. Pneumococcal 13-valent conjugate (PCV13) vaccine. One dose is recommended after age 73. Pneumococcal polysaccharide (PPSV23) vaccine. One dose is recommended after age 26. Talk to your health care provider about which screenings and vaccines you need and how often you need them. This information is not intended to replace advice given to you by your health care provider. Make sure you discuss any questions you have with your health care provider. Document Released: 11/12/2015 Document Revised: 07/05/2016 Document Reviewed: 08/17/2015 Elsevier Interactive Patient Education  2017 [place] Prevention in the Home Falls can cause injuries. They can happen to people of all ages. There are many things you can do to make your home safe and to help prevent falls. What can I do on the outside of my home? Regularly fix the edges of walkways and driveways and fix any cracks. Remove anything that might make you trip as you walk through a door, such as a raised step or threshold. Trim any bushes or trees on the path to your home. Use bright outdoor lighting. Clear any walking paths of anything that might make someone trip, such as rocks or tools. Regularly check to see if handrails are loose or broken. Make sure that both sides of any steps have handrails. Any raised decks and porches should have guardrails on the edges. Have any leaves, snow, or ice cleared regularly. Use sand or salt on walking paths during winter. Clean up any spills in your garage right away. This includes oil or grease spills. What can I do in the  bathroom? Use night lights. Install grab bars by the toilet and in the tub and shower. Do not use towel bars as grab bars. Use non-skid mats or decals in the tub or shower. If you need to sit down in the shower, use a plastic, non-slip stool. Keep the floor dry. Clean up any water that spills on the floor as soon as it happens. Remove soap buildup in the tub or shower regularly. Attach bath mats securely with double-sided non-slip rug tape. Do not have throw rugs and other things on the floor that can make you trip. What can I do in the bedroom? Use night lights. Make sure that you have a light by your bed that is easy to reach. Do not use any sheets or blankets that are too big for your bed. They should not hang down onto the floor. Have a firm chair that has side arms. You can use this for support while you get dressed. Do not have throw rugs and other things on the floor that can make you trip. What can I do in the kitchen? Clean up  any spills right away. Avoid walking on wet floors. Keep items that you use a lot in easy-to-reach places. If you need to reach something above you, use a strong step stool that has a grab bar. Keep electrical cords out of the way. Do not use floor polish or wax that makes floors slippery. If you must use wax, use non-skid floor wax. Do not have throw rugs and other things on the floor that can make you trip. What can I do with my stairs? Do not leave any items on the stairs. Make sure that there are handrails on both sides of the stairs and use them. Fix handrails that are broken or loose. Make sure that handrails are as long as the stairways. Check any carpeting to make sure that it is firmly attached to the stairs. Fix any carpet that is loose or worn. Avoid having throw rugs at the top or bottom of the stairs. If you do have throw rugs, attach them to the floor with carpet tape. Make sure that you have a light switch at the top of the stairs and the  bottom of the stairs. If you do not have them, ask someone to add them for you. What else can I do to help prevent falls? Wear shoes that: Do not have high heels. Have rubber bottoms. Are comfortable and fit you well. Are closed at the toe. Do not wear sandals. If you use a stepladder: Make sure that it is fully opened. Do not climb a closed stepladder. Make sure that both sides of the stepladder are locked into place. Ask someone to hold it for you, if possible. Clearly mark and make sure that you can see: Any grab bars or handrails. First and last steps. Where the edge of each step is. Use tools that help you move around (mobility aids) if they are needed. These include: Canes. Walkers. Scooters. Crutches. Turn on the lights when you go into a dark area. Replace any light bulbs as soon as they burn out. Set up your furniture so you have a clear path. Avoid moving your furniture around. If any of your floors are uneven, fix them. If there are any pets around you, be aware of where they are. Review your medicines with your doctor. Some medicines can make you feel dizzy. This can increase your chance of falling. Ask your doctor what other things that you can do to help prevent falls. This information is not intended to replace advice given to you by your health care provider. Make sure you discuss any questions you have with your health care provider. Document Released: 08/12/2009 Document Revised: 03/23/2016 Document Reviewed: 11/20/2014 Elsevier Interactive Patient Education  2017 [employer].

## 2023-04-26 NOTE — Progress Notes (Signed)
Subjective:   Patricia Lawson is a 77 y.o. female who presents for Medicare Annual (Subsequent) preventive examination.  Visit Complete: Virtual  I connected with  Patricia Lawson on 04/26/23 by a audio enabled telemedicine application and verified that I am speaking with the correct person using two identifiers.  Patient Location: Home  Provider Location: Office/Clinic  I discussed the limitations of evaluation and management by telemedicine. The patient expressed understanding and agreed to proceed.   Review of Systems     Cardiac Risk Factors include: diabetes mellitus;dyslipidemia;hypertension;advanced age (>59men, >59 women)     Objective:    Today's Vitals   04/26/23 1042  Weight: 120 lb (54.4 kg)  Height: 5\' 1"  (1.549 m)   Body mass index is 22.67 kg/m.     04/26/2023   10:32 AM 04/24/2022    3:44 PM 04/20/2021    1:30 PM 04/14/2020    1:38 PM 03/31/2019   11:09 AM 12/11/2018   11:21 AM 06/20/2018   11:46 PM  Advanced Directives  Does Patient Have a Medical Advance Directive? No No No No No No No  Would patient like information on creating a medical advance directive? No - Patient declined No - Patient declined Yes (MAU/Ambulatory/Procedural Areas - Information given) Yes (MAU/Ambulatory/Procedural Areas - Information given) Yes (MAU/Ambulatory/Procedural Areas - Information given) No - Patient declined No - Patient declined    Current Medications (verified) Outpatient Encounter Medications as of 04/26/2023  Medication Sig   alendronate (FOSAMAX) 70 MG tablet TAKE 1 TABLET BY MOUTH ONCE A WEEK (EVERY  7  DAYS)  WITH  A  FULL  GLASS  OF  WATER  ON  AN  EMPTY  STOMACH   aspirin EC 81 MG EC tablet Take 1 tablet (81 mg total) by mouth daily.   atorvastatin (LIPITOR) 80 MG tablet Take 1 tablet (80 mg total) by mouth daily. Dr Juliann Pares   Cholecalciferol 50 MCG (2000 UT) CAPS Take 1 capsule by mouth daily.   irbesartan (AVAPRO) 75 MG tablet Take 75 mg by mouth daily. Dr  Juliann Pares   levothyroxine (SYNTHROID) 50 MCG tablet Take 1 tablet by mouth once daily   metFORMIN (GLUCOPHAGE-XR) 750 MG 24 hr tablet Take 1 tablet (750 mg total) by mouth daily with breakfast.   metoprolol succinate (TOPROL-XL) 100 MG 24 hr tablet Take 100 mg by mouth daily. Dr Juliann Pares   No facility-administered encounter medications on file as of 04/26/2023.    Allergies (verified) Patient has no known allergies.   History: Past Medical History:  Diagnosis Date   CHF (congestive heart failure) (HCC) 2010   Coronary artery disease    Diabetes mellitus without complication (HCC)    type 2   Hyperlipidemia    Hypertension    Myocardial infarction (HCC) 2010   5 stents placed   Stroke (HCC) 05/2018   no deficits   Past Surgical History:  Procedure Laterality Date   CARDIAC CATHETERIZATION  2010   5 stents placed   CATARACT EXTRACTION W/ INTRAOCULAR LENS  IMPLANT, BILATERAL     HARDWARE REMOVAL Right 12/11/2018   Procedure: SCREW REMOVAL FROM PATELLA;  Surgeon: Christena Flake, MD;  Location: [facility];  Service: Orthopedics;  Laterality: Right;  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   LAPAROSCOPIC APPENDECTOMY N/A 03/04/2015   Procedure: APPENDECTOMY LAPAROSCOPIC;  Surgeon: Natale Lay, MD;  Location: [facility];  Service: General;  Laterality: N/A;   ORIF PATELLA Right 04/06/2018   Procedure: OPEN REDUCTION INTERNAL (  ORIF) FIXATION PATELLA;  Surgeon: Signa Kell, MD;  Location: [facility];  Service: Orthopedics;  Laterality: Right;   STENT PLACE LEFT URETER ([facility] HX)     cardiac   TENDON EXPLORATION Right 12/11/2018   Procedure: EXPLORATION OF PERONEAL NERVE LATERAL KNEE;  Surgeon: Christena Flake, MD;  Location: [facility];  Service: Orthopedics;  Laterality: Right;  Diabetic - oral meds   THYROIDECTOMY Bilateral    Family History  Problem Relation Age of Onset   Heart block Mother    Heart disease Mother    Hypertension Mother    Heart block Father    Heart disease  Father    Hypertension Father    Breast cancer Neg Hx    Social History   Socioeconomic History   Marital status: Divorced    Spouse name: Not on file   Number of children: 2   Years of education: Not on file   Highest education level: Some college, no degree  Occupational History   Occupation: retired  Tobacco Use   Smoking status: Former    Types: Cigarettes    Quit date: 03/03/2009    Years since quitting: 14.1   Smokeless tobacco: Never  Vaping Use   Vaping Use: Never used  Substance and Sexual Activity   Alcohol use: No   Drug use: Never   Sexual activity: Not Currently  Other Topics Concern   Not on file  Social History Narrative   Pt lives alone.    Social Determinants of Health   Financial Resource Strain: Low Risk  (04/26/2023)   Overall Financial Resource Strain (CARDIA)    Difficulty of Paying Living Expenses: Not hard at all  Food Insecurity: No Food Insecurity (04/26/2023)   Hunger Vital Sign    Worried About Running Out of Food in the Last Year: Never true    Ran Out of Food in the Last Year: Never true  Transportation Needs: No Transportation Needs (04/26/2023)   PRAPARE - Administrator, Civil Service (Medical): No    Lack of Transportation (Non-Medical): No  Physical Activity: Sufficiently Active (04/26/2023)   Exercise Vital Sign    Days of Exercise per Week: 7 days    Minutes of Exercise per Session: 30 min  Stress: No Stress Concern Present (04/26/2023)   [employer] of Occupational Health - Occupational Stress Questionnaire    Feeling of Stress : Not at all  Social Connections: Socially Isolated (04/26/2023)   Social Connection and Isolation Panel [NHANES]    Frequency of Communication with Friends and Family: More than three times a week    Frequency of Social Gatherings with Friends and Family: Never    Attends Religious Services: Never    Database administrator or Organizations: No    Attends Engineer, structural:  Never    Marital Status: Divorced    Tobacco Counseling Counseling given: Not Answered   Clinical Intake:  Pre-visit preparation completed: Yes  Pain : No/denies pain     Nutritional Risks: None Diabetes: Yes CBG done?: No Did pt. bring in CBG monitor from home?: No  How often do you need to have someone help you when you read instructions, pamphlets, or other written materials from your doctor or pharmacy?: 1 - Never  Interpreter Needed?: No  Information entered by :: Kennedy Bucker, LPN   Activities of Daily Living    04/26/2023   10:33 AM  In your present state of health, do you have  any difficulty performing the following activities:  Hearing? 0  Vision? 0  Difficulty concentrating or making decisions? 0  Walking or climbing stairs? 0  Dressing or bathing? 0  Doing errands, shopping? 0  Preparing Food and eating ? N  Using the Toilet? N  In the past six months, have you accidently leaked urine? N  Do you have problems with loss of bowel control? N  Managing your Medications? N  Managing your Finances? N  Housekeeping or managing your Housekeeping? N    Patient Care Team: Duanne Limerick, MD as PCP - General (Family Medicine) Alwyn Pea, MD as Consulting Physician (Cardiology)  Indicate any recent Medical Services you may have received from other than [facility] providers in the past year (date may be approximate).     Assessment:   This is a routine wellness examination for Jaylnn.  Hearing/Vision screen Hearing Screening - Comments:: No aids Vision Screening - Comments:: Readers- Dr.Shade  Dietary issues and exercise activities discussed:     Goals Addressed             This Visit's Progress    DIET - EAT MORE FRUITS AND VEGETABLES         Depression Screen    04/26/2023   10:31 AM 02/19/2023   10:43 AM 04/24/2022    3:44 PM 04/17/2022   10:11 AM 10/17/2021    8:59 AM 06/14/2021   10:08 AM 04/20/2021    1:29 PM  PHQ 2/9 Scores  PHQ  - 2 Score 0 0 0 0 0 0 0  PHQ- 9 Score 0 0  0 0 0     Fall Risk    04/26/2023   10:33 AM 02/19/2023   10:42 AM 04/24/2022    3:45 PM 04/17/2022   10:15 AM 10/17/2021    8:56 AM  Fall Risk   Falls in the past year? 0 0 0 0 1  Number falls in past yr: 0 0 0 0 0  Injury with Fall? 0 0 0 0 1  Risk for fall due to : No Fall Risks No Fall Risks No Fall Risks No Fall Risks No Fall Risks  Follow up Falls prevention discussed;Falls evaluation completed Falls evaluation completed Falls prevention discussed Falls evaluation completed Falls evaluation completed    MEDICARE RISK AT HOME:  Medicare Risk at Home - 04/26/23 1034     Any stairs in or around the home? Yes    If so, are there any without handrails? No    Home free of loose throw rugs in walkways, pet beds, electrical cords, etc? Yes    Adequate lighting in your home to reduce risk of falls? Yes    Life alert? No    Use of a cane, walker or w/c? No    Grab bars in the bathroom? Yes    Shower chair or bench in shower? Yes    Elevated toilet seat or a handicapped toilet? No             TIMED UP AND GO:  Was the test performed?  No    Cognitive Function:        04/26/2023   10:37 AM 04/14/2020    1:41 PM 03/31/2019   11:17 AM  6CIT Screen  What Year? 0 points 0 points 0 points  What month? 0 points 0 points 0 points  What time? 0 points 0 points 0 points  Count back from 20 0 points 0 points 0  points  Months in reverse 0 points 0 points 0 points  Repeat phrase 2 points 0 points 0 points  Total Score 2 points 0 points 0 points    Immunizations Immunization History  Administered Date(s) Administered   PFIZER(Purple Top)SARS-COV-2 Vaccination 02/02/2020, 02/23/2020, 09/13/2020   Pneumococcal Conjugate-13 08/15/2018   Pneumococcal Polysaccharide-23 06/14/2020   Tdap 08/17/2015, 06/20/2018    TDAP status: Up to date  Flu Vaccine status: Declined, Education has been provided regarding the importance of this vaccine  but patient still declined. Advised may receive this vaccine at local pharmacy or Health Dept. Aware to provide a copy of the vaccination record if obtained from local pharmacy or Health Dept. Verbalized acceptance and understanding.  Pneumococcal vaccine status: Up to date  Covid-19 vaccine status: Completed vaccines  Qualifies for Shingles Vaccine? Yes   Zostavax completed No   Shingrix Completed?: No.    Education has been provided regarding the importance of this vaccine. Patient has been advised to call insurance company to determine out of pocket expense if they have not yet received this vaccine. Advised may also receive vaccine at local pharmacy or Health Dept. Verbalized acceptance and understanding.  Screening Tests Health Maintenance  Topic Date Due   Hepatitis C Screening  Never done   OPHTHALMOLOGY EXAM  06/30/2022   COVID-19 Vaccine (4 - 2023-24 season) 06/30/2022   FOOT EXAM  04/18/2023   HEMOGLOBIN A1C  04/18/2023   INFLUENZA VACCINE  05/31/2023   Diabetic kidney evaluation - Urine ACR  10/18/2023   Diabetic kidney evaluation - eGFR measurement  02/19/2024   Medicare Annual Wellness (AWV)  04/25/2024   DTaP/Tdap/Td (3 - Td or Tdap) 06/20/2028   Pneumonia Vaccine 45+ Years old  Completed   DEXA SCAN  Completed   HPV VACCINES  Aged Out   Zoster Vaccines- Shingrix  Discontinued    Health Maintenance  Health Maintenance Due  Topic Date Due   Hepatitis C Screening  Never done   OPHTHALMOLOGY EXAM  06/30/2022   COVID-19 Vaccine (4 - 2023-24 season) 06/30/2022   FOOT EXAM  04/18/2023   HEMOGLOBIN A1C  04/18/2023    Colorectal cancer screening: No longer required.   Mammogram status: No longer required due to age.  Bone Density status: Completed 02/12/20. Results reflect: Bone density results: OSTEOPOROSIS. Repeat every 2 years.- WANTS TO DISCUSS W/ MD BEFORE ACCEPTS REFERRAL  Lung Cancer Screening: (Low Dose CT Chest recommended if Age 26-80 years, 20 pack-year  currently smoking OR have quit w/in 15years.) does not qualify.    Additional Screening:  Hepatitis C Screening: does qualify; Completed NO  Vision Screening: Recommended annual ophthalmology exams for early detection of glaucoma and other disorders of the eye. Is the patient up to date with their annual eye exam?  Yes  Who is the provider or what is the name of the office in which the patient attends annual eye exams? Dr.Shade If pt is not established with a provider, would they like to be referred to a provider to establish care? No .   Dental Screening: Recommended annual dental exams for proper oral hygiene   Community Resource Referral / Chronic Care Management: CRR required this visit?  No   CCM required this visit?  No     Plan:     I have personally reviewed and noted the following in the patient's chart:   Medical and social history Use of alcohol, tobacco or illicit drugs  Current medications and supplements including opioid prescriptions. Patient  is not currently taking opioid prescriptions. Functional ability and status Nutritional status Physical activity Advanced directives List of other physicians Hospitalizations, surgeries, and ER visits in previous 12 months Vitals Screenings to include cognitive, depression, and falls Referrals and appointments  In addition, I have reviewed and discussed with patient certain preventive protocols, quality metrics, and best practice recommendations. A written personalized care plan for preventive services as well as general preventive health recommendations were provided to patient.     Hal Hope, LPN   1/61/0960   After Visit Summary: (MyChart) Due to this being a telephonic visit, the after visit summary with patients personalized plan was offered to patient via MyChart   Nurse Notes: none

## 2023-05-03 ENCOUNTER — Other Ambulatory Visit: Payer: Self-pay | Admitting: Family Medicine

## 2023-05-03 DIAGNOSIS — E039 Hypothyroidism, unspecified: Secondary | ICD-10-CM

## 2023-06-25 ENCOUNTER — Ambulatory Visit (INDEPENDENT_AMBULATORY_CARE_PROVIDER_SITE_OTHER): Payer: Medicare Other | Admitting: Family Medicine

## 2023-06-25 ENCOUNTER — Encounter: Payer: Self-pay | Admitting: Family Medicine

## 2023-06-25 VITALS — BP 118/68 | HR 78 | Ht 61.0 in | Wt 122.0 lb

## 2023-06-25 DIAGNOSIS — M810 Age-related osteoporosis without current pathological fracture: Secondary | ICD-10-CM

## 2023-06-25 DIAGNOSIS — Z7984 Long term (current) use of oral hypoglycemic drugs: Secondary | ICD-10-CM | POA: Diagnosis not present

## 2023-06-25 DIAGNOSIS — E119 Type 2 diabetes mellitus without complications: Secondary | ICD-10-CM | POA: Diagnosis not present

## 2023-06-25 MED ORDER — ALENDRONATE SODIUM 70 MG PO TABS
70.0000 mg | ORAL_TABLET | ORAL | 3 refills | Status: DC
Start: 2023-06-25 — End: 2023-12-27

## 2023-06-25 MED ORDER — METFORMIN HCL ER 750 MG PO TB24: 750.0000 mg | ORAL_TABLET | Freq: Every day | ORAL | 1 refills | Status: DC

## 2023-06-25 NOTE — Progress Notes (Signed)
Date:  06/25/2023   Name:  Patricia Lawson   DOB:  27-Aug-1946   MRN:  [id number]   Chief Complaint: Diabetes  Patient is a 78 year old female who presents for a comprehensive physical exam. The patient reports the following problems: menopausal osteoporosis. Health maintenance has been reviewed up to date.    Diabetes She presents for her follow-up diabetic visit. She has type 2 diabetes mellitus. Her disease course has been stable. There are no hypoglycemic associated symptoms. There are no diabetic associated symptoms. Pertinent negatives for diabetes include no blurred vision, no chest pain, no fatigue, no polydipsia, no polyuria, no visual change and no weight loss. There are no hypoglycemic complications. Symptoms are stable. There are no diabetic complications. Current diabetic treatment includes oral agent (monotherapy). She is compliant with treatment all of the time. Her breakfast blood glucose range is generally 90-110 mg/dl. An ACE inhibitor/angiotensin II receptor blocker is being taken.    Lab Results  Component Value Date   NA 139 02/19/2023   K 5.3 (H) 02/19/2023   CO2 20 02/19/2023   GLUCOSE 97 02/19/2023   BUN 15 02/19/2023   CREATININE 1.32 (H) 02/19/2023   CALCIUM 10.3 02/19/2023   EGFR 42 (L) 02/19/2023   GFRNONAA >60 06/14/2020   Lab Results  Component Value Date   CHOL 145 02/19/2023   HDL 55 02/19/2023   LDLCALC 63 02/19/2023   TRIG 158 (H) 02/19/2023   CHOLHDL 4.9 06/21/2018   Lab Results  Component Value Date   TSH 0.809 02/19/2023   Lab Results  Component Value Date   HGBA1C 6.2 (H) 10/17/2022   Lab Results  Component Value Date   WBC 7.9 10/17/2022   HGB 11.3 10/17/2022   HCT 33.3 (L) 10/17/2022   MCV 91 10/17/2022   PLT 180 10/17/2022   Lab Results  Component Value Date   ALT 12 02/19/2023   AST 23 02/19/2023   ALKPHOS 68 02/19/2023   BILITOT 0.4 02/19/2023   No results found for: "25OHVITD2", "25OHVITD3", "VD25OH"   Review  of Systems  Constitutional:  Negative for fatigue and weight loss.  Eyes:  Negative for blurred vision and visual disturbance.  Respiratory:  Negative for cough, shortness of breath and wheezing.   Cardiovascular:  Negative for chest pain, palpitations and leg swelling.  Gastrointestinal:  Negative for abdominal pain and blood in stool.  Endocrine: Negative for polydipsia and polyuria.    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   Fall 06/21/2018   RUE weakness 06/20/2018   Facial laceration 06/20/2018   HTN (hypertension) 06/20/2018   CAD (coronary artery disease) 06/20/2018   UTI (urinary tract infection) 06/20/2018   Patella fracture 04/05/2018   Appendicitis, acute 03/04/2015    No Known Allergies  Past Surgical History:  Procedure Laterality Date   CARDIAC CATHETERIZATION  2010   5 stents placed   CATARACT EXTRACTION W/ INTRAOCULAR LENS  IMPLANT, BILATERAL     HARDWARE REMOVAL Right 12/11/2018   Procedure: SCREW REMOVAL FROM PATELLA;  Surgeon: Joice Lofts,  Excell Seltzer, MD;  Location: [facility];  Service: Orthopedics;  Laterality: Right;  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   LAPAROSCOPIC APPENDECTOMY N/A 03/04/2015   Procedure: APPENDECTOMY LAPAROSCOPIC;  Surgeon: Natale Lay, MD;  Location: [facility];  Service: General;  Laterality: N/A;   ORIF PATELLA Right 04/06/2018   Procedure: OPEN REDUCTION INTERNAL (ORIF) FIXATION PATELLA;  Surgeon: Signa Kell, MD;  Location: [facility];  Service:  Orthopedics;  Laterality: Right;   STENT PLACE LEFT URETER ([facility] HX)     cardiac   TENDON EXPLORATION Right 12/11/2018   Procedure: EXPLORATION OF PERONEAL NERVE LATERAL KNEE;  Surgeon: Christena Flake, MD;  Location: [facility];  Service: Orthopedics;  Laterality: Right;  Diabetic - oral meds   THYROIDECTOMY Bilateral     Social History   Tobacco Use   Smoking status: Former    Current packs/day: 0.00    Types: Cigarettes    Quit date: 03/03/2009    Years since quitting: 14.3   Smokeless tobacco: Never  Vaping Use   Vaping status: Never Used  Substance Use Topics   Alcohol use: No   Drug use: Never     Medication list has been reviewed and updated.  Current Meds  Medication Sig   aspirin EC 81 MG EC tablet Take 1 tablet (81 mg total) by mouth daily.   atorvastatin (LIPITOR) 80 MG tablet Take 1 tablet (80 mg total) by mouth daily. Dr Juliann Pares   Cholecalciferol 50 MCG (2000 UT) CAPS Take 1 capsule by mouth daily.   irbesartan (AVAPRO) 75 MG tablet Take 75 mg by mouth daily. Dr Juliann Pares   levothyroxine (SYNTHROID) 50 MCG tablet Take 1 tablet by mouth once daily   metoprolol succinate (TOPROL-XL) 100 MG 24 hr tablet Take 100 mg by mouth daily. Dr Juliann Pares   [DISCONTINUED] alendronate (FOSAMAX) 70 MG tablet TAKE 1 TABLET BY MOUTH ONCE A WEEK (EVERY  7  DAYS)  WITH  A  FULL  GLASS  OF  WATER  ON  AN  EMPTY  STOMACH       06/25/2023   10:54 AM 02/19/2023   10:43 AM 04/17/2022   10:11 AM 10/17/2021    8:59 AM  GAD 7 : Generalized Anxiety Score  Nervous, Anxious, on Edge 0 0 0 0  Control/stop worrying 0 0 0 0  Worry too much - different things 0 0 0 0  Trouble relaxing 0 0 0 0  Restless 0 0 0 0  Easily annoyed or irritable 0 0 0 0  Afraid - awful might happen 0 0 0 0  Total GAD 7 Score 0 0 0 0  Anxiety Difficulty Not difficult at all Not difficult at all Not difficult at all        06/25/2023   10:54 AM 04/26/2023   10:31 AM 02/19/2023   10:43 AM  Depression screen  PHQ 2/9  Decreased Interest 0 0 0  Down, Depressed, Hopeless 0 0 0  PHQ - 2 Score 0 0 0  Altered sleeping 0 0 0  Tired, decreased energy 0 0 0  Change in appetite 0 0 0  Feeling bad or failure about yourself  0 0 0  Trouble concentrating 0 0 0  Moving slowly or fidgety/restless 0 0 0  Suicidal thoughts 0 0 0  PHQ-9 Score 0 0 0  Difficult doing work/chores Not difficult at all Not difficult at all Not difficult at all    BP Readings from Last 3 Encounters:  06/25/23 118/68  02/19/23 120/70  10/17/22 100/60    Physical Exam Vitals and nursing note reviewed. Exam conducted with a chaperone present.  Constitutional:      General: She is not in acute distress.    Appearance: She is not diaphoretic.  HENT:     Head: Normocephalic and atraumatic.     Right Ear: Tympanic membrane and external ear normal.     Left Ear: Tympanic membrane and external ear normal.     Nose: Nose normal.  Eyes:     General:        Right eye: No discharge.        Left eye: No discharge.     Conjunctiva/sclera: Conjunctivae normal.     Pupils: Pupils are equal, round, and reactive to light.  Neck:     Thyroid: No thyromegaly.     Vascular: No JVD.  Cardiovascular:     Rate and Rhythm: Normal rate and regular rhythm.     Heart sounds: Normal heart sounds. No murmur heard.    No friction rub. No gallop.  Pulmonary:     Effort: Pulmonary effort is normal.     Breath sounds: Normal breath sounds.  Abdominal:     General: Bowel sounds are normal.     Palpations: Abdomen is soft. There is no mass.     Tenderness: There is no abdominal tenderness. There is no guarding.  Musculoskeletal:        General: Normal range of motion.     Cervical back: Normal range of motion and neck supple.  Lymphadenopathy:     Cervical: No cervical adenopathy.  Skin:    General: Skin is warm and dry.  Neurological:     Mental Status: She is alert.     Deep Tendon Reflexes: Reflexes are normal and symmetric.      Wt Readings from Last 3 Encounters:  06/25/23 122 lb (55.3 kg)  04/26/23 120 lb (54.4 kg)  02/19/23 120 lb (54.4 kg)    BP 118/68   Pulse 78   Ht 5\' 1"  (1.549 m)   Wt 122 lb (55.3 kg)   SpO2 100%   BMI 23.05 kg/m   Assessment and Plan:  1. Type 2 diabetes mellitus without complication, without long-term current use of insulin (HCC) Chronic.  Controlled.  Stable.  Will continue metformin XR 750 mg once a day.  Will check A1c and microalbuminuria.  Diabetic foot exam was done and it was normal.  Will recheck in 4 to 6 months. - metFORMIN (GLUCOPHAGE-XR) 750 MG 24 hr tablet; Take 1 tablet (750 mg total) by mouth daily with breakfast.  Dispense: 90 tablet; Refill: 1 - HgB A1c - Microalbumin / creatinine urine ratio  2. Age-related osteoporosis without current pathological fracture Chronic.  Controlled.  Stable.  Discussed with patient that this was a request by Dr. Allena Katz that noted that the bone seemed brittle at the time of surgery.  We will continue Fosamax 70 mg weekly but we will obtain a bone density after being on it for a year to see if there is progression and to document before proceeding the second year. - alendronate (FOSAMAX) 70 MG tablet; Take 1 tablet (70 mg total) by mouth once a week. Take with a full glass of water on an empty stomach.  Dispense: 12 tablet; Refill: 3 - DG Bone Density    Elizabeth Sauer, MD

## 2023-06-26 ENCOUNTER — Encounter: Payer: Self-pay | Admitting: Family Medicine

## 2023-06-26 LAB — HEMOGLOBIN A1C
Est. average glucose Bld gHb Est-mCnc: 128 mg/dL
Hgb A1c MFr Bld: 6.1 % — ABNORMAL HIGH (ref 4.8–5.6)

## 2023-07-03 ENCOUNTER — Ambulatory Visit
Admission: RE | Admit: 2023-07-03 | Discharge: 2023-07-03 | Disposition: A | Discharge status: Home / Self Care | Payer: Medicare Other | Source: Ambulatory Visit | Attending: Family Medicine | Admitting: Family Medicine

## 2023-07-03 DIAGNOSIS — M810 Age-related osteoporosis without current pathological fracture: Secondary | ICD-10-CM | POA: Diagnosis not present

## 2023-07-03 DIAGNOSIS — M8589 Other specified disorders of bone density and structure, multiple sites: Secondary | ICD-10-CM | POA: Diagnosis not present

## 2023-07-03 DIAGNOSIS — Z780 Asymptomatic menopausal state: Secondary | ICD-10-CM | POA: Diagnosis not present

## 2023-07-20 DIAGNOSIS — E119 Type 2 diabetes mellitus without complications: Secondary | ICD-10-CM | POA: Diagnosis not present

## 2023-07-20 LAB — HM DIABETES EYE EXAM

## 2023-08-04 ENCOUNTER — Other Ambulatory Visit: Payer: Self-pay | Admitting: Family Medicine

## 2023-08-04 DIAGNOSIS — E039 Hypothyroidism, unspecified: Secondary | ICD-10-CM

## 2023-08-15 DIAGNOSIS — I2510 Atherosclerotic heart disease of native coronary artery without angina pectoris: Secondary | ICD-10-CM | POA: Diagnosis not present

## 2023-08-15 DIAGNOSIS — I219 Acute myocardial infarction, unspecified: Secondary | ICD-10-CM | POA: Diagnosis not present

## 2023-08-15 DIAGNOSIS — I10 Essential (primary) hypertension: Secondary | ICD-10-CM | POA: Diagnosis not present

## 2023-08-15 DIAGNOSIS — E119 Type 2 diabetes mellitus without complications: Secondary | ICD-10-CM | POA: Diagnosis not present

## 2023-08-15 DIAGNOSIS — I639 Cerebral infarction, unspecified: Secondary | ICD-10-CM | POA: Diagnosis not present

## 2023-08-15 DIAGNOSIS — E785 Hyperlipidemia, unspecified: Secondary | ICD-10-CM | POA: Diagnosis not present

## 2023-08-15 DIAGNOSIS — Z955 Presence of coronary angioplasty implant and graft: Secondary | ICD-10-CM | POA: Diagnosis not present

## 2023-10-31 ENCOUNTER — Other Ambulatory Visit: Payer: Self-pay | Admitting: Family Medicine

## 2023-10-31 DIAGNOSIS — E039 Hypothyroidism, unspecified: Secondary | ICD-10-CM

## 2023-12-27 ENCOUNTER — Ambulatory Visit (INDEPENDENT_AMBULATORY_CARE_PROVIDER_SITE_OTHER): Payer: Medicare Other | Admitting: Family Medicine

## 2023-12-27 ENCOUNTER — Encounter: Payer: Self-pay | Admitting: Family Medicine

## 2023-12-27 VITALS — BP 134/72 | HR 76 | Ht 61.0 in | Wt 136.0 lb

## 2023-12-27 DIAGNOSIS — E039 Hypothyroidism, unspecified: Secondary | ICD-10-CM | POA: Diagnosis not present

## 2023-12-27 DIAGNOSIS — E782 Mixed hyperlipidemia: Secondary | ICD-10-CM

## 2023-12-27 DIAGNOSIS — M810 Age-related osteoporosis without current pathological fracture: Secondary | ICD-10-CM

## 2023-12-27 DIAGNOSIS — E119 Type 2 diabetes mellitus without complications: Secondary | ICD-10-CM

## 2023-12-27 DIAGNOSIS — Z7984 Long term (current) use of oral hypoglycemic drugs: Secondary | ICD-10-CM

## 2023-12-27 MED ORDER — ALENDRONATE SODIUM 70 MG PO TABS: 70.0000 mg | ORAL_TABLET | ORAL | 3 refills | Status: DC

## 2023-12-27 MED ORDER — LEVOTHYROXINE SODIUM 50 MCG PO TABS: 50.0000 ug | ORAL_TABLET | Freq: Every day | ORAL | 1 refills | Status: DC

## 2023-12-27 MED ORDER — METOPROLOL SUCCINATE ER 100 MG PO TB24
100.0000 mg | ORAL_TABLET | Freq: Every day | ORAL | 1 refills | Status: DC
Start: 2023-12-27 — End: 2024-05-14

## 2023-12-27 MED ORDER — IRBESARTAN 75 MG PO TABS: 75.0000 mg | ORAL_TABLET | Freq: Every day | ORAL | 1 refills | Status: AC

## 2023-12-27 MED ORDER — METFORMIN HCL ER 750 MG PO TB24
750.0000 mg | ORAL_TABLET | Freq: Every day | ORAL | 1 refills | Status: DC
Start: 2023-12-27 — End: 2024-05-14

## 2023-12-27 MED ORDER — ATORVASTATIN CALCIUM 80 MG PO TABS: 80.0000 mg | ORAL_TABLET | Freq: Every day | ORAL | 1 refills | Status: AC

## 2023-12-27 NOTE — Progress Notes (Signed)
 Date:  12/27/2023   Name:  Patricia Lawson   DOB:  [birth year]   MRN:  [id number]   Chief Complaint: Hypothyroidism, Hyperlipidemia, and Diabetes  Hyperlipidemia This is a chronic problem. The current episode started more than 1 year ago. The problem is controlled. Recent lipid tests were reviewed and are normal. She has no history of chronic renal disease, diabetes, hypothyroidism, liver disease, obesity or nephrotic syndrome. There are no known factors aggravating her hyperlipidemia. Pertinent negatives include no chest pain, focal sensory loss, focal weakness, leg pain, myalgias or shortness of breath. Current antihyperlipidemic treatment includes statins. The current treatment provides moderate improvement of lipids. There are no compliance problems.   Diabetes She presents for her follow-up diabetic visit. She has type 2 diabetes mellitus. Her disease course has been stable. There are no hypoglycemic associated symptoms. Pertinent negatives for hypoglycemia include no confusion, dizziness, headaches, hunger, mood changes, nervousness/anxiousness, pallor, seizures, sleepiness, speech difficulty, sweats or tremors. Pertinent negatives for diabetes include no blurred vision, no chest pain, no fatigue, no foot paresthesias, no foot ulcerations, no polydipsia, no polyphagia, no polyuria, no visual change, no weakness and no weight loss. There are no hypoglycemic complications. Symptoms are stable. There are no diabetic complications. Risk factors for coronary artery disease include dyslipidemia, hypertension and diabetes mellitus.    Lab Results  Component Value Date   NA 139 02/19/2023   K 5.3 (H) 02/19/2023   CO2 20 02/19/2023   GLUCOSE 97 02/19/2023   BUN 15 02/19/2023   CREATININE 1.32 (H) 02/19/2023   CALCIUM 10.3 02/19/2023   EGFR 42 (L) 02/19/2023   GFRNONAA >60 06/14/2020   Lab Results  Component Value Date   CHOL 145 02/19/2023   HDL 55 02/19/2023   LDLCALC 63 02/19/2023    TRIG 158 (H) 02/19/2023   CHOLHDL 4.9 06/21/2018   Lab Results  Component Value Date   TSH 0.809 02/19/2023   Lab Results  Component Value Date   HGBA1C 6.1 (H) 06/25/2023   Lab Results  Component Value Date   WBC 7.9 10/17/2022   HGB 11.3 10/17/2022   HCT 33.3 (L) 10/17/2022   MCV 91 10/17/2022   PLT 180 10/17/2022   Lab Results  Component Value Date   ALT 12 02/19/2023   AST 23 02/19/2023   ALKPHOS 68 02/19/2023   BILITOT 0.4 02/19/2023   No results found for: "25OHVITD2", "25OHVITD3", "VD25OH"   Review of Systems  Constitutional:  Negative for fatigue and weight loss.  HENT:  Negative for congestion and trouble swallowing.   Eyes:  Negative for blurred vision.  Respiratory:  Negative for cough, shortness of breath and wheezing.   Cardiovascular:  Negative for chest pain.  Gastrointestinal:  Negative for abdominal pain, blood in stool and constipation.  Endocrine: Negative for cold intolerance, heat intolerance, polydipsia, polyphagia and polyuria.  Genitourinary:  Negative for difficulty urinating and hematuria.  Musculoskeletal:  Negative for myalgias.  Skin:  Negative for pallor.  Neurological:  Negative for dizziness, tremors, focal weakness, seizures, speech difficulty, weakness and headaches.  Psychiatric/Behavioral:  Negative for confusion. The patient is not nervous/anxious.     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   Fall 06/21/2018   RUE weakness 06/20/2018    Facial laceration 06/20/2018   HTN (hypertension) 06/20/2018   CAD (coronary artery disease) 06/20/2018   UTI (urinary tract infection) 06/20/2018   Patella fracture 04/05/2018   Appendicitis, acute 03/04/2015    No Known Allergies  Past Surgical History:  Procedure Laterality Date   CARDIAC CATHETERIZATION  2010   5 stents placed   CATARACT EXTRACTION W/ INTRAOCULAR LENS  IMPLANT, BILATERAL     HARDWARE REMOVAL Right 12/11/2018   Procedure: SCREW REMOVAL FROM PATELLA;  Surgeon: Christena Flake, MD;  Location: [facility];  Service: Orthopedics;  Laterality: Right;  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   LAPAROSCOPIC APPENDECTOMY N/A 03/04/2015   Procedure: APPENDECTOMY LAPAROSCOPIC;  Surgeon: Natale Lay, MD;  Location: [facility];  Service: General;  Laterality: N/A;   ORIF PATELLA Right 04/06/2018   Procedure: OPEN REDUCTION INTERNAL (ORIF) FIXATION PATELLA;  Surgeon: Signa Kell, MD;  Location: [facility];  Service: Orthopedics;  Laterality: Right;   STENT PLACE LEFT URETER ([facility] HX)     cardiac   TENDON EXPLORATION Right 12/11/2018   Procedure: EXPLORATION OF PERONEAL NERVE LATERAL KNEE;  Surgeon: Christena Flake, MD;  Location: [facility];  Service: Orthopedics;  Laterality: Right;  Diabetic - oral meds   THYROIDECTOMY Bilateral     Social History   Tobacco Use   Smoking status: Former    Current packs/day: 0.00    Types: Cigarettes    Quit date: 03/03/2009    Years since quitting: 14.8   Smokeless tobacco: Never  Vaping Use   Vaping status: Never Used  Substance Use Topics   Alcohol use: No   Drug use: Never     Medication list has been reviewed and updated.  Current Meds  Medication Sig   alendronate (FOSAMAX) 70 MG tablet Take 1 tablet (70 mg total) by mouth once a week. Take with a full glass of water on an empty stomach.   aspirin EC 81 MG EC tablet Take 1 tablet (81 mg total) by mouth daily.   atorvastatin (LIPITOR) 80 MG tablet Take 1 tablet (80 mg  total) by mouth daily. Dr Juliann Pares   Cholecalciferol 50 MCG (2000 UT) CAPS Take 1 capsule by mouth daily.   irbesartan (AVAPRO) 75 MG tablet Take 75 mg by mouth daily. Dr Juliann Pares   levothyroxine (SYNTHROID) 50 MCG tablet Take 1 tablet by mouth once daily   metFORMIN (GLUCOPHAGE-XR) 750 MG 24 hr tablet Take 1 tablet (750 mg total) by mouth daily with breakfast.   metoprolol succinate (TOPROL-XL) 100 MG 24 hr tablet Take 100 mg by mouth daily. Dr Juliann Pares       12/27/2023   10:34 AM 06/25/2023   10:54 AM 02/19/2023   10:43 AM 04/17/2022   10:11 AM  GAD 7 : Generalized Anxiety Score  Nervous, Anxious, on Edge 0 0 0 0  Control/stop worrying 0 0 0 0  Worry too much - different things 0 0 0 0  Trouble relaxing 0 0 0 0  Restless 0 0 0 0  Easily annoyed or irritable 0 0 0 0  Afraid - awful might happen 0 0 0 0  Total GAD 7 Score 0 0 0 0  Anxiety Difficulty Not difficult at all Not difficult at all Not difficult at all Not difficult at all  12/27/2023   10:34 AM 06/25/2023   10:54 AM 04/26/2023   10:31 AM  Depression screen PHQ 2/9  Decreased Interest 0 0 0  Down, Depressed, Hopeless 0 0 0  PHQ - 2 Score 0 0 0  Altered sleeping 0 0 0  Tired, decreased energy 0 0 0  Change in appetite 0 0 0  Feeling bad or failure about yourself  0 0 0  Trouble concentrating 0 0 0  Moving slowly or fidgety/restless 0 0 0  Suicidal thoughts 0 0 0  PHQ-9 Score 0 0 0  Difficult doing work/chores Not difficult at all Not difficult at all Not difficult at all    BP Readings from Last 3 Encounters:  12/27/23 134/72  06/25/23 118/68  02/19/23 120/70    Physical Exam Vitals and nursing note reviewed.  Constitutional:      General: She is not in acute distress.    Appearance: She is not diaphoretic.  HENT:     Head: Normocephalic and atraumatic.     Right Ear: Tympanic membrane and external ear normal.     Left Ear: Tympanic membrane and external ear normal.     Nose: Nose normal.  Eyes:      General:        Right eye: No discharge.        Left eye: No discharge.     Conjunctiva/sclera: Conjunctivae normal.     Pupils: Pupils are equal, round, and reactive to light.  Neck:     Thyroid: No thyromegaly.     Vascular: No JVD.  Cardiovascular:     Rate and Rhythm: Normal rate and regular rhythm.     Heart sounds: Normal heart sounds. No murmur heard.    No friction rub. No gallop.  Pulmonary:     Effort: Pulmonary effort is normal.     Breath sounds: Normal breath sounds. No wheezing, rhonchi or rales.  Abdominal:     General: Bowel sounds are normal.     Palpations: Abdomen is soft. There is no mass.     Tenderness: There is no abdominal tenderness. There is no guarding.  Musculoskeletal:     Cervical back: Normal range of motion and neck supple.  Lymphadenopathy:     Cervical: No cervical adenopathy.  Skin:    General: Skin is warm and dry.  Neurological:     Mental Status: She is alert.     Deep Tendon Reflexes: Reflexes are normal and symmetric.     Reflex Scores:      Patellar reflexes are 2+ on the right side and 2+ on the left side.    Wt Readings from Last 3 Encounters:  12/27/23 136 lb (61.7 kg)  06/25/23 122 lb (55.3 kg)  04/26/23 120 lb (54.4 kg)    BP 134/72   Pulse 76   Ht 5\' 1"  (1.549 m)   Wt 136 lb (61.7 kg)   SpO2 96%   BMI 25.70 kg/m   Assessment and Plan: 1. Diabetes mellitus treated with oral medication (HCC) (Primary) Chronic.  Controlled.  Stable.  Asymptomatic without polyuria polydipsia.  Will continue metformin XR 750 mg once a day.  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 - Hemoglobin A1c - Microalbumin / creatinine urine ratio - metFORMIN (GLUCOPHAGE-XR) 750 MG 24 hr tablet; Take 1 tablet (750 mg total) by mouth daily with breakfast.  Dispense: 90 tablet; Refill: 1  2. Age-related osteoporosis without current  pathological fracture Chronic.  Controlled.  Stable.   Patient will continue with Fosamax 70 mg once a week.  Will recheck patient in 6 months - alendronate (FOSAMAX) 70 MG tablet; Take 1 tablet (70 mg total) by mouth once a week. Take with a full glass of water on an empty stomach.  Dispense: 12 tablet; Refill: 3  3. Hypothyroidism, unspecified type Chronic.  Controlled.  Stable.  Asymptomatic.  Tolerating current dosing of levothyroxine 50 mcg daily.  Will check TSH pressure current level of control. - levothyroxine (SYNTHROID) 50 MCG tablet; Take 1 tablet (50 mcg total) by mouth daily.  Dispense: 90 tablet; Refill: 1 - TSH  4. Mixed hyperlipidemia Chronic.  Controlled.  Stable.  Currently controlled with dietary discretion and patient has been given reminder of her low-cholesterol low triglyceride dietary guidelines.  Will check lipid panel for current level of control and CMP for hepatic concerns. - Lipid Panel With LDL/HDL Ratio - Comprehensive metabolic panel    Elizabeth Sauer, MD

## 2023-12-27 NOTE — Patient Instructions (Signed)

## 2023-12-28 LAB — LIPID PANEL WITH LDL/HDL RATIO
Cholesterol, Total: 158 mg/dL (ref 100–199)
HDL: 45 mg/dL (ref >39)
LDL Chol Calc (NIH): 73 mg/dL (ref 0–99)
LDL/HDL Ratio: 1.6 {ratio} (ref 0.0–3.2)
Triglycerides: 244 mg/dL — ABNORMAL HIGH (ref 0–149)
VLDL Cholesterol Cal: 40 mg/dL (ref 5–40)

## 2023-12-28 LAB — COMPREHENSIVE METABOLIC PANEL
ALT: 13 [IU]/L (ref 0–32)
AST: 20 [IU]/L (ref 0–40)
Albumin: 4.1 g/dL (ref 3.8–4.8)
Alkaline Phosphatase: 74 [IU]/L (ref 44–121)
BUN/Creatinine Ratio: 16 (ref 12–28)
BUN: 27 mg/dL (ref 8–27)
Bilirubin Total: 0.3 mg/dL (ref 0.0–1.2)
CO2: 22 mmol/L (ref 20–29)
Calcium: 11 mg/dL — ABNORMAL HIGH (ref 8.7–10.3)
Chloride: 101 mmol/L (ref 96–106)
Creatinine, Ser: 1.73 mg/dL — ABNORMAL HIGH (ref 0.57–1.00)
Globulin, Total: 3.1 g/dL (ref 1.5–4.5)
Glucose: 92 mg/dL (ref 70–99)
Potassium: 6.1 mmol/L — ABNORMAL HIGH (ref 3.5–5.2)
Sodium: 139 mmol/L (ref 134–144)
Total Protein: 7.2 g/dL (ref 6.0–8.5)
eGFR: 30 mL/min/{1.73_m2} — ABNORMAL LOW (ref >59)

## 2023-12-28 LAB — TSH: TSH: 2.46 u[IU]/mL (ref 0.450–4.500)

## 2023-12-28 LAB — HEMOGLOBIN A1C
Est. average glucose Bld gHb Est-mCnc: 134 mg/dL
Hgb A1c MFr Bld: 6.3 % — ABNORMAL HIGH (ref 4.8–5.6)

## 2023-12-28 LAB — MICROALBUMIN / CREATININE URINE RATIO
Creatinine, Urine: 183.2 mg/dL
Microalb/Creat Ratio: 49 mg/g{creat} — ABNORMAL HIGH (ref 0–29)
Microalbumin, Urine: 89.5 ug/mL

## 2023-12-30 ENCOUNTER — Telehealth: Payer: Self-pay | Admitting: Family Medicine

## 2023-12-30 ENCOUNTER — Other Ambulatory Visit: Payer: Self-pay | Admitting: Family Medicine

## 2023-12-30 DIAGNOSIS — N1832 Chronic kidney disease, stage 3b: Secondary | ICD-10-CM

## 2023-12-30 NOTE — Telephone Encounter (Signed)
 Called and discussed with patient that I would like to go ahead and repeat her renal panel in the morning after she is hydrated today to evaluate GFR/creatinine/potassium and will likely resume her irbesartan on Tuesday after reviewing lab work.  I have also discussed with her the likelihood that I will be placing a referral to Dr. Latif/nephrology for follow-up as well.

## 2023-12-31 DIAGNOSIS — N1832 Chronic kidney disease, stage 3b: Secondary | ICD-10-CM | POA: Diagnosis not present

## 2024-01-01 ENCOUNTER — Other Ambulatory Visit: Payer: Self-pay

## 2024-01-01 DIAGNOSIS — N1832 Chronic kidney disease, stage 3b: Secondary | ICD-10-CM

## 2024-01-01 LAB — RENAL FUNCTION PANEL
Albumin: 4.4 g/dL (ref 3.8–4.8)
BUN/Creatinine Ratio: 17 (ref 12–28)
BUN: 30 mg/dL — ABNORMAL HIGH (ref 8–27)
CO2: 22 mmol/L (ref 20–29)
Calcium: 11 mg/dL — ABNORMAL HIGH (ref 8.7–10.3)
Chloride: 101 mmol/L (ref 96–106)
Creatinine, Ser: 1.79 mg/dL — ABNORMAL HIGH (ref 0.57–1.00)
Glucose: 106 mg/dL — ABNORMAL HIGH (ref 70–99)
Phosphorus: 3.6 mg/dL (ref 3.0–4.3)
Potassium: 6.3 mmol/L — ABNORMAL HIGH (ref 3.5–5.2)
Sodium: 137 mmol/L (ref 134–144)
eGFR: 29 mL/min/{1.73_m2} — ABNORMAL LOW (ref >59)

## 2024-01-04 ENCOUNTER — Telehealth: Payer: Self-pay

## 2024-01-04 NOTE — Telephone Encounter (Signed)
 Called pt with labs. She verbalized understanding.  KP  Copied from CRM [id number]. Topic: Clinical - Lab/Test Results >> Jan 03, 2024 10:03 AM Carlatta H wrote: Reason for CRM: Please call patient back regarding test results//Please call anytime

## 2024-01-08 ENCOUNTER — Other Ambulatory Visit: Payer: Self-pay | Admitting: Nephrology

## 2024-01-08 DIAGNOSIS — N1832 Chronic kidney disease, stage 3b: Secondary | ICD-10-CM | POA: Diagnosis not present

## 2024-01-08 DIAGNOSIS — E1122 Type 2 diabetes mellitus with diabetic chronic kidney disease: Secondary | ICD-10-CM | POA: Diagnosis not present

## 2024-01-08 DIAGNOSIS — I10 Essential (primary) hypertension: Secondary | ICD-10-CM | POA: Diagnosis not present

## 2024-01-08 DIAGNOSIS — E875 Hyperkalemia: Secondary | ICD-10-CM | POA: Diagnosis not present

## 2024-01-08 DIAGNOSIS — R809 Proteinuria, unspecified: Secondary | ICD-10-CM | POA: Diagnosis not present

## 2024-01-10 ENCOUNTER — Ambulatory Visit
Admission: RE | Admit: 2024-01-10 | Discharge: 2024-01-10 | Disposition: A | Discharge status: Home / Self Care | Source: Ambulatory Visit | Attending: Nephrology | Admitting: Nephrology

## 2024-01-10 DIAGNOSIS — N1832 Chronic kidney disease, stage 3b: Secondary | ICD-10-CM

## 2024-01-14 ENCOUNTER — Ambulatory Visit (INDEPENDENT_AMBULATORY_CARE_PROVIDER_SITE_OTHER): Payer: Medicare Other | Admitting: Family Medicine

## 2024-01-14 ENCOUNTER — Encounter: Payer: Self-pay | Admitting: Family Medicine

## 2024-01-14 VITALS — BP 118/68 | HR 63 | Ht 61.0 in | Wt 131.0 lb

## 2024-01-14 DIAGNOSIS — R69 Illness, unspecified: Secondary | ICD-10-CM | POA: Diagnosis not present

## 2024-01-14 DIAGNOSIS — R5381 Other malaise: Secondary | ICD-10-CM | POA: Diagnosis not present

## 2024-01-14 DIAGNOSIS — E875 Hyperkalemia: Secondary | ICD-10-CM

## 2024-01-14 DIAGNOSIS — D649 Anemia, unspecified: Secondary | ICD-10-CM | POA: Diagnosis not present

## 2024-01-14 NOTE — Progress Notes (Signed)
 Date:  01/14/2024   Name:  Patricia Lawson   DOB:  Mar 19, 1946   MRN:  [id number]   Chief Complaint: No chief complaint on file.  Hypertension This is a chronic problem. The current episode started more than 1 year ago. The problem has been gradually improving since onset. The problem is controlled. Pertinent negatives include no anxiety, blurred vision, chest pain, headaches, malaise/fatigue, neck pain, orthopnea, palpitations, peripheral edema, PND, shortness of breath or sweats. There are no associated agents to hypertension. Risk factors for coronary artery disease include dyslipidemia. Past treatments include angiotensin blockers and beta blockers. The current treatment provides moderate improvement. There are no compliance problems.  There is no history of angina, kidney disease, CAD/MI, CVA, heart failure, left ventricular hypertrophy, PVD or retinopathy.    Lab Results  Component Value Date   NA 137 12/31/2023   K 6.3 (H) 12/31/2023   CO2 22 12/31/2023   GLUCOSE 106 (H) 12/31/2023   BUN 30 (H) 12/31/2023   CREATININE 1.79 (H) 12/31/2023   CALCIUM 11.0 (H) 12/31/2023   EGFR 29 (L) 12/31/2023   GFRNONAA >60 06/14/2020   Lab Results  Component Value Date   CHOL 158 12/27/2023   HDL 45 12/27/2023   LDLCALC 73 12/27/2023   TRIG 244 (H) 12/27/2023   CHOLHDL 4.9 06/21/2018   Lab Results  Component Value Date   TSH 2.460 12/27/2023   Lab Results  Component Value Date   HGBA1C 6.3 (H) 12/27/2023   Lab Results  Component Value Date   WBC 7.9 10/17/2022   HGB 11.3 10/17/2022   HCT 33.3 (L) 10/17/2022   MCV 91 10/17/2022   PLT 180 10/17/2022   Lab Results  Component Value Date   ALT 13 12/27/2023   AST 20 12/27/2023   ALKPHOS 74 12/27/2023   BILITOT 0.3 12/27/2023   No results found for: "25OHVITD2", "25OHVITD3", "VD25OH"   Review of Systems  Constitutional:  Negative for malaise/fatigue.  Eyes:  Negative for blurred vision.  Respiratory:  Negative for  choking, chest tightness, shortness of breath and wheezing.   Cardiovascular:  Negative for chest pain, palpitations, orthopnea and PND.  Musculoskeletal:  Negative for neck pain.  Neurological:  Negative for headaches.    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   Fall 06/21/2018   RUE weakness 06/20/2018   Facial laceration 06/20/2018   HTN (hypertension) 06/20/2018   CAD (coronary artery disease) 06/20/2018   UTI (urinary tract infection) 06/20/2018   Patella fracture 04/05/2018   Appendicitis, acute 03/04/2015    No Known Allergies  Past Surgical History:  Procedure Laterality Date   CARDIAC CATHETERIZATION  2010   5 stents placed   CATARACT EXTRACTION W/ INTRAOCULAR LENS  IMPLANT, BILATERAL     HARDWARE REMOVAL Right 12/11/2018   Procedure: SCREW REMOVAL FROM PATELLA;  Surgeon: Christena Flake, MD;  Location: [facility];  Service: Orthopedics;  Laterality: Right;  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   LAPAROSCOPIC APPENDECTOMY N/A 03/04/2015   Procedure: APPENDECTOMY LAPAROSCOPIC;  Surgeon: Natale Lay,  MD;  Location: [facility];  Service: General;  Laterality: N/A;   ORIF PATELLA Right 04/06/2018   Procedure: OPEN REDUCTION INTERNAL (ORIF) FIXATION PATELLA;  Surgeon: Signa Kell, MD;  Location: [facility];  Service: Orthopedics;  Laterality: Right;   STENT PLACE LEFT URETER ([facility] HX)     cardiac   TENDON EXPLORATION Right 12/11/2018   Procedure: EXPLORATION OF PERONEAL NERVE LATERAL  KNEE;  Surgeon: Christena Flake, MD;  Location: [facility];  Service: Orthopedics;  Laterality: Right;  Diabetic - oral meds   THYROIDECTOMY Bilateral     Social History   Tobacco Use   Smoking status: Former    Current packs/day: 0.00    Types: Cigarettes    Quit date: 03/03/2009    Years since quitting: 14.8   Smokeless tobacco: Never  Vaping Use   Vaping status: Never Used  Substance Use Topics   Alcohol use: No   Drug use: Never     Medication list has been reviewed and updated.  Current Meds  Medication Sig   alendronate (FOSAMAX) 70 MG tablet Take 1 tablet (70 mg total) by mouth once a week. Take with a full glass of water on an empty stomach.   aspirin EC 81 MG EC tablet Take 1 tablet (81 mg total) by mouth daily.   atorvastatin (LIPITOR) 80 MG tablet Take 1 tablet (80 mg total) by mouth daily. Dr Juliann Pares   Calcium Carb-Cholecalciferol (CALCIUM 600+D) 600-20 MG-MCG TABS Take by mouth.   irbesartan (AVAPRO) 75 MG tablet Take 1 tablet (75 mg total) by mouth daily. Dr Juliann Pares   levothyroxine (SYNTHROID) 50 MCG tablet Take 1 tablet (50 mcg total) by mouth daily.   LOKELMA 10 g PACK packet Take 10 g by mouth daily.   metFORMIN (GLUCOPHAGE-XR) 750 MG 24 hr tablet Take 1 tablet (750 mg total) by mouth daily with breakfast.   metoprolol succinate (TOPROL-XL) 100 MG 24 hr tablet Take 1 tablet (100 mg total) by mouth daily. Dr Juliann Pares       12/27/2023   10:34 AM 06/25/2023   10:54 AM 02/19/2023   10:43 AM 04/17/2022   10:11 AM  GAD 7 : Generalized Anxiety Score  Nervous, Anxious, on Edge 0 0 0 0  Control/stop worrying 0 0 0 0  Worry too much - different things 0 0 0 0  Trouble relaxing 0 0 0 0  Restless 0 0 0 0  Easily annoyed or irritable 0 0 0 0  Afraid - awful might happen 0 0 0 0  Total GAD 7 Score 0 0 0 0  Anxiety Difficulty Not difficult at all Not difficult at all Not difficult at all Not difficult at all       12/27/2023   10:34 AM 06/25/2023   10:54 AM  04/26/2023   10:31 AM  Depression screen PHQ 2/9  Decreased Interest 0 0 0  Down, Depressed, Hopeless 0 0 0  PHQ - 2 Score 0 0 0  Altered sleeping 0 0 0  Tired, decreased energy 0 0 0  Change in appetite 0 0 0  Feeling bad or failure about yourself  0 0 0  Trouble concentrating 0 0 0  Moving slowly or fidgety/restless 0 0 0  Suicidal thoughts 0 0 0  PHQ-9 Score 0 0 0  Difficult doing work/chores Not difficult at all Not difficult at all Not difficult at all    BP Readings from Last 3 Encounters:  01/14/24 118/68  12/27/23 134/72  06/25/23 118/68    Physical Exam Vitals and nursing note reviewed.  Constitutional:      General: She is not in acute distress.    Appearance: She is not diaphoretic.  HENT:     Head: Normocephalic and atraumatic.     Right Ear: Tympanic membrane and external ear normal.     Left Ear: Tympanic membrane and external ear normal.     Nose: Nose normal. No congestion or rhinorrhea.     Mouth/Throat:     Mouth: Mucous membranes are moist.  Eyes:     General:        Right eye: No discharge.        Left eye: No discharge.     Conjunctiva/sclera: Conjunctivae normal.     Pupils: Pupils are equal, round, and reactive to light.  Neck:     Thyroid: No thyromegaly.     Vascular: No JVD.  Cardiovascular:     Rate and Rhythm: Normal rate and regular rhythm.     Heart sounds: Normal heart sounds. No murmur heard.    No friction rub. No gallop.  Pulmonary:     Effort: Pulmonary effort is normal.     Breath sounds: Normal breath sounds. No wheezing, rhonchi or rales.  Abdominal:     General: Bowel sounds are normal.     Palpations: Abdomen is soft. There is no mass.     Tenderness: There is no abdominal tenderness. There is no guarding.  Musculoskeletal:        General: Normal range of motion.     Cervical back: Normal range of motion and neck supple.  Lymphadenopathy:     Cervical: No cervical adenopathy.  Skin:    General: Skin is warm and dry.   Neurological:     Mental Status: She is alert.     Deep Tendon Reflexes: Reflexes are normal and symmetric.     Wt Readings from Last 3 Encounters:  01/14/24 131 lb (59.4 kg)  12/27/23 136 lb (61.7 kg)  06/25/23 122 lb (55.3 kg)    BP 118/68   Pulse 63   Ht 5\' 1"  (1.549 m)   Wt 131 lb (59.4 kg)   SpO2 99%   BMI 24.75 kg/m   Assessment and Plan: 1. Malaise (Primary) New onset.  Persistent.  Began with the initiation of Lokelma 10 g that she takes in the morning.  Not certain if this is a cause-and-effect from the initiation of the medication or new complaint here for I have suggested that she take her medication more at night and that it cases an immediate reaction to the medication while awake.  In the meantime we will check a renal function panel to determine if there is any worsening of her potassium.  2. Anemia, unspecified type Chronic.  Persistent.  Patient has had a decrease in hemoglobin in the past which has normal indices.  We will check a hemoglobin as well as a ferritin in case this is early in the onset of a gradual iron deficiency anemia. - Hemoglobin - Ferritin  3. Hyperkalemia Elevated potassium in the past.  Patient is currently on medication and is followed by nephrology.  Will check renal function panel and that she is not to be rechecked by nephrology until April 10. - Renal Function Panel  4. Taking medication for chronic disease As noted taking medication for chronic disease that I am not sure if she is having a reaction or an intolerance to the  medication of is just started although she feels there is some cause-and-effect I am not convinced and that we need to rule out other issues before cessation of medication.    Elizabeth Sauer, MD

## 2024-01-15 LAB — RENAL FUNCTION PANEL
Albumin: 4.4 g/dL (ref 3.8–4.8)
BUN/Creatinine Ratio: 16 (ref 12–28)
BUN: 35 mg/dL — ABNORMAL HIGH (ref 8–27)
CO2: 24 mmol/L (ref 20–29)
Calcium: 11.3 mg/dL — ABNORMAL HIGH (ref 8.7–10.3)
Chloride: 99 mmol/L (ref 96–106)
Creatinine, Ser: 2.19 mg/dL — ABNORMAL HIGH (ref 0.57–1.00)
Glucose: 96 mg/dL (ref 70–99)
Phosphorus: 3.8 mg/dL (ref 3.0–4.3)
Potassium: 5.4 mmol/L — ABNORMAL HIGH (ref 3.5–5.2)
Sodium: 138 mmol/L (ref 134–144)
eGFR: 23 mL/min/{1.73_m2} — ABNORMAL LOW (ref >59)

## 2024-01-15 LAB — FERRITIN: Ferritin: 392 ng/mL — ABNORMAL HIGH (ref 15–150)

## 2024-01-15 LAB — HEMOGLOBIN: Hemoglobin: 11 g/dL — ABNORMAL LOW (ref 11.1–15.9)

## 2024-01-16 ENCOUNTER — Encounter: Payer: Self-pay | Admitting: Family Medicine

## 2024-01-29 DIAGNOSIS — N1832 Chronic kidney disease, stage 3b: Secondary | ICD-10-CM | POA: Diagnosis not present

## 2024-01-29 DIAGNOSIS — E875 Hyperkalemia: Secondary | ICD-10-CM | POA: Diagnosis not present

## 2024-01-29 DIAGNOSIS — R809 Proteinuria, unspecified: Secondary | ICD-10-CM | POA: Diagnosis not present

## 2024-01-29 DIAGNOSIS — E1122 Type 2 diabetes mellitus with diabetic chronic kidney disease: Secondary | ICD-10-CM | POA: Diagnosis not present

## 2024-01-29 DIAGNOSIS — I10 Essential (primary) hypertension: Secondary | ICD-10-CM | POA: Diagnosis not present

## 2024-01-30 DIAGNOSIS — I10 Essential (primary) hypertension: Secondary | ICD-10-CM | POA: Diagnosis not present

## 2024-01-30 DIAGNOSIS — E1122 Type 2 diabetes mellitus with diabetic chronic kidney disease: Secondary | ICD-10-CM | POA: Diagnosis not present

## 2024-01-30 DIAGNOSIS — N1832 Chronic kidney disease, stage 3b: Secondary | ICD-10-CM | POA: Diagnosis not present

## 2024-01-30 DIAGNOSIS — E875 Hyperkalemia: Secondary | ICD-10-CM | POA: Diagnosis not present

## 2024-01-30 DIAGNOSIS — D631 Anemia in chronic kidney disease: Secondary | ICD-10-CM | POA: Diagnosis not present

## 2024-01-30 NOTE — Progress Notes (Signed)
 Follow Up Visit   Patient Name: Patricia Lawson, female   Patient DOB: [birth year] Date of Service: 01/30/2024  Patient MRN: [id number] Provider Creating Note: Bonnell Sherry, MD  [phone] Primary Care Physician:   [id number] [place] [id number] Additional Physicians/ Providers:    History of Present Illness Patricia Lawson is a 78 y.o. female who is following up today for diabetes mellitus type 2 with chronic kidney disease, chronic kidney disease stage IIIb, proteinuria, pretension, hyperkalemia, and anemia of chronic kidney disease.  At the last visit we initiated serologic workup.  Patient found have negative SPEP and UPEP.  She did have positive ANA with negative secondary panel.  Renal ultrasound was also unremarkable.  Most recent EGFR was found to be 36.  In regards to hypertension blood pressure well-controlled at 115/70.  She has history of hyperkalemia and potassium came down to 4.3 with use of Lokelma.  Patient reports that the medication makes her feel badly after she takes the medication.  She develops fatigue.  She also has anemia of chronic kidney disease with most recent hemoglobin of 10.6.   Medications   Current Outpatient Medications:  .  alendronate  (FOSAMAX ) 70 MG tablet, Take 70 mg by mouth, Disp: , Rfl:  .  aspirin  ([facility]) 81 MG EC tablet, Take 81 mg by mouth, Disp: , Rfl:  .  atorvastatin  (LIPITOR) 80 MG tablet, Take 80 mg by mouth, Disp: , Rfl:  .  Calcium  Carb-Cholecalciferol 500-10 MG-MCG tablet, Take 1 tablet by mouth, Disp: , Rfl:  .  irbesartan  (AVAPRO ) 75 MG tablet, Take 75 mg by mouth, Disp: , Rfl:  .  levothyroxine  (SYNTHROID , LEVOTHROID) 50 MCG tablet, Take 50 mcg by mouth, Disp: , Rfl:  .  metFORMIN  XR (GLUCOPHAGE -XR) 750 MG 24 hr tablet, Take 750 mg by mouth, Disp: , Rfl:  .  metoprolol  succinate XL (TOPROL -XL) 100 MG 24 hr tablet, Take 100 mg by mouth, Disp: , Rfl:  .  Sodium Zirconium Cyclosilicate (Lokelma) 10 g pack, Take 10 g by mouth 1 (one)  time each day, Disp: 30 each, Rfl: 11   Allergies Patient has no known allergies.  Problem List There is no problem list on file for this patient.    Review of Systems  Constitutional:  Negative for chills and fever.  Respiratory:  Negative for cough and shortness of breath.   Cardiovascular:  Negative for chest pain and palpitations.  Gastrointestinal:  Negative for nausea and vomiting.  Genitourinary:  Negative for dysuria, hematuria and urgency.     History Past Medical History:  Diagnosis Date  . Acute myocardial infarction, unspecified site, episode of care unspecified (HCC)   . Coronary atherosclerosis of unspecified type of vessel, native or graft   . Diabetes mellitus without mention of complication, type II or unspecified type, not stated as uncontrolled (HCC)   . Essential hypertension   . Hyperparathyroidism (HCC)   . Hypothyroidism   . Other and unspecified hyperlipidemia   . Urinary tract infection     History reviewed. No pertinent surgical history. Family History  Problem Relation Age of Onset  . Gout Mother   . Dementia Mother   . Heart disease Mother   . Hypertension Mother   . Dementia Father   . Heart disease Father   . Hypertension Father   . Cancer Daughter    Social History   Tobacco Use  . Smoking status: Former    Types: Cigarettes  . Smokeless tobacco: Never  Substance Use Topics  . Alcohol use: Not Currently        Physical Exam  Vitals BP 115/70 (BP Location: Right upper arm, Patient Position: Sitting)   Pulse 74   Temp 97.9 F   Wt 133 lb (60.3 kg)   SpO2 98%   BMI 25.13 kg/m   PHYSICAL EXAM: General appearance: well developed, well nourished, NAD Eyes: anicteric sclerae, moist conjunctivae; no lid-lag  HENT: Atraumatic; hearing intact Neck: Trachea midline; supple Lungs: CTAB, with normal respiratory effort  CV: S1S2, no murmurs or rubs. Abdomen: Soft, non-tender; bowel sounds present Extremities: No peripheral  edema Skin: Warm and dry, normal skin turgor, no rashes noted. Psych: Appropriate affect, alert and oriented to person, place and time    Laboratory Studies  Chemistry  Lab Units 01/29/24 1548 01/08/24 1104  SODIUM mmol/L 136 136  POTASSIUM mmol/L 4.3 5.5*  CHLORIDE mmol/L 100 101  CO2 mmol/L 27 26  CALCIUM  mg/dL 9.7 89.5  PHOSPHORUS mg/dL 3.0 2.9  PTH pg/mL 45 29  GLUCOSE mg/dL 840* 97  ALBUMIN g/dL 4.4 4.1  4.5  BUN mg/dL 26* 29*  CREATININE mg/dL 8.50* 8.08*        No lab exists for component: IRON SATURATION, TRANSSATPER  CBC  Lab Units 01/29/24 1548 01/08/24 1104  WBC AUTO Thousand/uL 8.5 9.3  HEMOGLOBIN g/dL 89.3* 88.9*  HEMATOCRIT % 31.4* 33.2*  MCV fL 90.5 91.7  PLATELETS AUTO Thousand/uL 234 224    Urine  Lab Units 01/29/24 1548 01/08/24 1104  PROT/CREAT RATIO UR mg/g creat 0.333*  333* 0.123  123    Lab Results  Component Value Date   PTH 45 01/29/2024   CALCIUM  9.7 01/29/2024   PHOS 3.0 01/29/2024     Imaging and Other Studies  01/08/2024: Negative SPEP/UPEP.  Positive ANA with negative secondary panel.  01/10/2024 renal ultrasound: Right kidney 9.1 cm, left kidney 8.2 cm, no hydronephrosis noted.   Orders Placed This Encounter  . CBC and Differential  . Renal Function Panel  . PTH, Intact  . Urine Albumin / Creatinine Ratio  . Renal function panel       Impression/Recommendations  Patricia Lawson is a 78 y.o. female with past medical history of coronary disease with history of myocardial infarction, diabetes mellitus type 2, hypertension, hypothyroidism, hyperlipidemia, osteoporosis, history of CVA who was referred for evaluation of chronic kidney disease stage IIIb.  1.  Diabetes mellitus type 2 chronic kidney disease/chronic kidney disease stage IIIb/proteinuria.  At the last visit we initiated serologic workup.  Patient had a negative SPEP, UPEP.  She did have positive ANA with negative secondary panel.  Renal ultrasound  performed and unremarkable.  We plan to maintain the patient on Avapro  for renal protection and proteinuria suppression at this time.  2.  Hypertension.  Blood pressure under good control at 115/70.  Maintain the patient on Toprol  and irbesartan .  3.  Hyperkalemia.  Serum potassium 1 from 5.5 down to 4.3 with use of Lokelma.  Patient reports feeling fatigued from the medication.  We will hold Lokelma and see what her potassium does within 2 weeks.  4.  Anemia of chronic kidney disease.  Most recent hemoglobin was 10.6.  No indication for Procrit at the moment.  Repeat CBC prior to the next visit.   Return in about 4 months (around 05/31/2024).   Munsoor Lateef, MD

## 2024-02-18 DIAGNOSIS — E875 Hyperkalemia: Secondary | ICD-10-CM | POA: Diagnosis not present

## 2024-02-18 DIAGNOSIS — E1122 Type 2 diabetes mellitus with diabetic chronic kidney disease: Secondary | ICD-10-CM | POA: Diagnosis not present

## 2024-04-25 ENCOUNTER — Encounter: Payer: Self-pay | Admitting: Student

## 2024-04-25 ENCOUNTER — Ambulatory Visit (INDEPENDENT_AMBULATORY_CARE_PROVIDER_SITE_OTHER): Payer: Medicare Other | Admitting: Student

## 2024-04-25 VITALS — BP 104/60 | HR 72 | Ht 61.0 in | Wt 129.2 lb

## 2024-04-25 DIAGNOSIS — N1832 Chronic kidney disease, stage 3b: Secondary | ICD-10-CM | POA: Diagnosis not present

## 2024-04-25 DIAGNOSIS — N183 Chronic kidney disease, stage 3 unspecified: Secondary | ICD-10-CM | POA: Insufficient documentation

## 2024-04-25 DIAGNOSIS — M816 Localized osteoporosis [Lequesne]: Secondary | ICD-10-CM | POA: Diagnosis not present

## 2024-04-25 DIAGNOSIS — Z7984 Long term (current) use of oral hypoglycemic drugs: Secondary | ICD-10-CM

## 2024-04-25 DIAGNOSIS — E119 Type 2 diabetes mellitus without complications: Secondary | ICD-10-CM | POA: Diagnosis not present

## 2024-04-25 DIAGNOSIS — I2510 Atherosclerotic heart disease of native coronary artery without angina pectoris: Secondary | ICD-10-CM

## 2024-04-25 DIAGNOSIS — M810 Age-related osteoporosis without current pathological fracture: Secondary | ICD-10-CM | POA: Insufficient documentation

## 2024-04-25 DIAGNOSIS — E039 Hypothyroidism, unspecified: Secondary | ICD-10-CM | POA: Diagnosis not present

## 2024-04-25 DIAGNOSIS — I10 Essential (primary) hypertension: Secondary | ICD-10-CM | POA: Diagnosis not present

## 2024-04-25 DIAGNOSIS — D638 Anemia in other chronic diseases classified elsewhere: Secondary | ICD-10-CM | POA: Insufficient documentation

## 2024-04-25 NOTE — Assessment & Plan Note (Signed)
 No anginal symptoms. Tolerating irbesartan  75mg  daily, metoprolol  100 mg daily well, also taking ASA, continue current medications.

## 2024-04-25 NOTE — Assessment & Plan Note (Signed)
 Continue on levothyroxine  50 mcg daily, TSH at next visit.

## 2024-04-25 NOTE — Progress Notes (Signed)
 Established Patient Office Visit  Subjective   Patient ID: Patricia Lawson, female    DOB: [birth year]  Age: 78 y.o. MRN: [id number]  Chief Complaint  Patient presents with   Diabetes   Hypertension   Hypothyroidism   Patricia Lawson is a 78 y.o. person who present today for follow up of chronic conditions.   Checking glucoses about once week typically around 100-110 fasting.  She reports lokemla was discontinued after last nephrology appointment. Feels well today without acute complaints. Please refer to problem based charting for further details and assessment and plan of current problem and chronic medical conditions.    Patient Active Problem List   Diagnosis Date Noted   CKD (chronic kidney disease) stage 3, GFR 30-59 ml/min (HCC) 04/25/2024   Anemia of chronic disease 04/25/2024   Osteoporosis 04/25/2024   Primary hyperparathyroidism (HCC) 03/02/2020   Hypothyroidism 12/16/2019   Hyperlipidemia 03/31/2019   Type 2 diabetes mellitus without complication, without long-term current use of insulin  (HCC) 02/10/2019   HTN (hypertension) 06/20/2018   CAD (coronary artery disease) 06/20/2018      ROS Refer to HPI    Objective:     BP 104/60   Pulse 72   Ht 5' 1 (1.549 m)   Wt 129 lb 4 oz (58.6 kg)   SpO2 97%   BMI 24.42 kg/m  BP Readings from Last 3 Encounters:  04/25/24 104/60  01/14/24 118/68  12/27/23 134/72    Physical Exam Constitutional:      Comments: Frail  HENT:     Mouth/Throat:     Mouth: Mucous membranes are moist.     Pharynx: Oropharynx is clear.   Cardiovascular:     Rate and Rhythm: Normal rate and regular rhythm.     Pulses: Normal pulses.     Heart sounds: No murmur heard. Pulmonary:     Effort: Pulmonary effort is normal.     Breath sounds: No rhonchi or rales.  Abdominal:     General: Abdomen is flat. Bowel sounds are normal. There is no distension.     Palpations: Abdomen is soft.     Tenderness: There is no abdominal  tenderness.   Musculoskeletal:        General: Normal range of motion.     Right lower leg: No edema.     Left lower leg: No edema.   Skin:    General: Skin is warm and dry.     Capillary Refill: Capillary refill takes less than 2 seconds.     Comments: No skin breakdown of the feet   Neurological:     General: No focal deficit present.     Mental Status: She is alert and oriented to person, place, and time.   Psychiatric:        Mood and Affect: Mood normal.        Behavior: Behavior normal.        04/25/2024    9:59 AM 12/27/2023   10:34 AM 06/25/2023   10:54 AM  Depression screen PHQ 2/9  Decreased Interest 0 0 0  Down, Depressed, Hopeless 0 0 0  PHQ - 2 Score 0 0 0  Altered sleeping 0 0 0  Tired, decreased energy 0 0 0  Change in appetite 0 0 0  Feeling bad or failure about yourself  0 0 0  Trouble concentrating 0 0 0  Moving slowly or fidgety/restless 0 0 0  Suicidal thoughts 0 0 0  PHQ-9 Score 0  0 0  Difficult doing work/chores Not difficult at all Not difficult at all Not difficult at all       04/25/2024    9:59 AM 12/27/2023   10:34 AM 06/25/2023   10:54 AM 02/19/2023   10:43 AM  GAD 7 : Generalized Anxiety Score  Nervous, Anxious, on Edge 0 0 0 0  Control/stop worrying 0 0 0 0  Worry too much - different things 0 0 0 0  Trouble relaxing 0 0 0 0  Restless 0 0 0 0  Easily annoyed or irritable 0 0 0 0  Afraid - awful might happen 0 0 0 0  Total GAD 7 Score 0 0 0 0  Anxiety Difficulty Not difficult at all Not difficult at all Not difficult at all Not difficult at all    No results found for any visits on 04/25/24.  Last CBC Lab Results  Component Value Date   WBC 7.9 10/17/2022   HGB 11.0 (L) 01/14/2024   HCT 33.3 (L) 10/17/2022   MCV 91 10/17/2022   MCH 30.8 10/17/2022   RDW 11.8 10/17/2022   PLT 180 10/17/2022   Last metabolic panel Lab Results  Component Value Date   GLUCOSE 96 01/14/2024   NA 138 01/14/2024   K 5.4 (H) 01/14/2024   CL  99 01/14/2024   CO2 24 01/14/2024   BUN 35 (H) 01/14/2024   CREATININE 2.19 (H) 01/14/2024   EGFR 23 (L) 01/14/2024   CALCIUM  11.3 (H) 01/14/2024   PHOS 3.8 01/14/2024   PROT 7.2 12/27/2023   ALBUMIN 4.4 01/14/2024   LABGLOB 3.1 12/27/2023   AGRATIO 2.0 02/19/2023   BILITOT 0.3 12/27/2023   ALKPHOS 74 12/27/2023   AST 20 12/27/2023   ALT 13 12/27/2023   ANIONGAP 7 06/14/2020      The ASCVD Risk score (Arnett DK, et al., 2019) failed to calculate for the following reasons:   Risk score cannot be calculated because patient has a medical history suggesting prior/existing ASCVD    Assessment & Plan:  Stage 3b chronic kidney disease (HCC) Assessment & Plan: Managed by Dr. Lateef, likely due to DM. No longer taking Lokelma, denies muscles aches palpations, nausea, vomiting. Will check K today. Will discus her other chronic medications including metformin  and alendronate  with nephology given declining GFR. Seeing Nephrology in August.   Orders: -     Renal function panel  Type 2 diabetes mellitus without complication, without long-term current use of insulin  (HCC) Assessment & Plan: Appears well controlled on metformin  on irbesartan  with proteinuria as well. A1c today. Discuss metformin  with nephology.   Orders: -     Hemoglobin A1c  Hypothyroidism, unspecified type Assessment & Plan: Continue on levothyroxine  50 mcg daily, TSH at next visit.   Primary hypertension Assessment & Plan: BP running lower today than previous measurements. She does not check BP home regularly. Denies dizziness, lightheadedness, falls, or weakness. I have her asked her to monitor BP at home. Will check RFP today.    Coronary artery disease involving native coronary artery of native heart without angina pectoris Assessment & Plan: No anginal symptoms. Tolerating irbesartan  75mg  daily, metoprolol  100 mg daily well, also taking ASA, continue current medications.   Localized osteoporosis without  current pathological fracture Assessment & Plan: BMD improved from prior study in 2021, Currently on alendronate  however has had decline in kidney function in the last year GFR~28. Will discuss with nephology regarding safety of continuing this medication. RFP today.  Return in about 6 months (around 10/25/2024) for chronic f/u.    Harlene Saddler, MD

## 2024-04-25 NOTE — Assessment & Plan Note (Addendum)
 Appears well controlled on metformin  on irbesartan  with proteinuria as well. A1c today. Discuss metformin  with nephology.

## 2024-04-25 NOTE — Assessment & Plan Note (Signed)
 BP running lower today than previous measurements. She does not check BP home regularly. Denies dizziness, lightheadedness, falls, or weakness. I have her asked her to monitor BP at home. Will check RFP today.

## 2024-04-25 NOTE — Assessment & Plan Note (Signed)
 Managed by Dr. Lateef, likely due to DM. No longer taking Lokelma, denies muscles aches palpations, nausea, vomiting. Will check K today. Will discus her other chronic medications including metformin  and alendronate  with nephology given declining GFR. Seeing Nephrology in August.

## 2024-04-25 NOTE — Assessment & Plan Note (Signed)
 BMD improved from prior study in 2021, Currently on alendronate  however has had decline in kidney function in the last year GFR~28. Will discuss with nephology regarding safety of continuing this medication. RFP today.

## 2024-04-26 LAB — RENAL FUNCTION PANEL
Albumin: 4.5 g/dL (ref 3.8–4.8)
BUN/Creatinine Ratio: 14 (ref 12–28)
BUN: 28 mg/dL — ABNORMAL HIGH (ref 8–27)
CO2: 20 mmol/L (ref 20–29)
Calcium: 10.4 mg/dL — ABNORMAL HIGH (ref 8.7–10.3)
Chloride: 101 mmol/L (ref 96–106)
Creatinine, Ser: 1.94 mg/dL — ABNORMAL HIGH (ref 0.57–1.00)
Glucose: 104 mg/dL — ABNORMAL HIGH (ref 70–99)
Phosphorus: 3.6 mg/dL (ref 3.0–4.3)
Potassium: 6.1 mmol/L — ABNORMAL HIGH (ref 3.5–5.2)
Sodium: 138 mmol/L (ref 134–144)
eGFR: 26 mL/min/{1.73_m2} — ABNORMAL LOW (ref >59)

## 2024-04-26 LAB — HEMOGLOBIN A1C
Est. average glucose Bld gHb Est-mCnc: 128 mg/dL
Hgb A1c MFr Bld: 6.1 % — ABNORMAL HIGH (ref 4.8–5.6)

## 2024-04-28 ENCOUNTER — Ambulatory Visit: Payer: Self-pay | Admitting: Student

## 2024-04-30 ENCOUNTER — Ambulatory Visit: Payer: Self-pay

## 2024-04-30 NOTE — Telephone Encounter (Signed)
 FYI Only or Action Required?: Action required by provider: clinical question for provider.  Patient was last seen in primary care on 04/25/2024 by Lemon Raisin, MD. Called Nurse Triage reporting No chief complaint on file.. Symptoms began today. Interventions attempted: Prescription medications: see note . Symptoms are: stable.  Triage Disposition: No disposition on file. See note. Pls advise   Patient/caregiver understands and will follow disposition?: yes             Answer Assessment - Initial Assessment Questions 1. REASON FOR CALL or QUESTION:  Vitamins: -- Patient has a 2-in-1 bottle of Calcium  (1200mg ) and Vitamin D (1,000 IU). Patient is taking one tablet daily.  Patient purchased this bottle OTC, under the advisement of a historical provider.  Patient asking if she should continue with these dosages? Please advise.     Metformin  ( Glucophage - XR) - Under the advisement of her PCP, patient stopped taking her metformin .  Patient just wanted to relay to her PCP that there was a slight increase in her BG.  Yesterday: 116 ( normal 100-110).   Lokelma 10g Pack: --Patient reports taking one dose of the medication and noting that   I feel overall yucky after taking it.  Patient unable to describe in further details how she feels.  Patient denies pain, GI discomfort, fatigue, weakness,constipation, edema/wt gain.  Patient wanted the provider to be aware.     Patient educated on pertinent s/s that would warrant emergent help/call 911/ ED/UC.  Patient verbalized understanding and agrees with plan No additional questions/concerns noted during the time of the call.    Message routed appropriately.  Protocols used: Information Only Call - No Triage-A-AH

## 2024-04-30 NOTE — Telephone Encounter (Signed)
 Called pt. Pt is aware.  Patricia Lawson.

## 2024-04-30 NOTE — Telephone Encounter (Signed)
 Copied from CRM [id number]. Topic: Clinical - Medication Question >> Apr 30, 2024  8:42 AM Precious C wrote: Reason for CRM: Patient called requesting to speak with her PCP. I informed her I would send a note to the clinic for a call back. She has questions about her vitamins and calcium . She also reported that since stopping metFORMIN  (GLUCOPHAGE -XR) 750 MG 24 hr tablet on Monday, her blood sugar has slightly increased. Additionally, she restarted LOKELMA 10 g PACK packet and is now not feeling well. Patient requests a call back at [phone] anytime after 12 noon.

## 2024-04-30 NOTE — Telephone Encounter (Signed)
 Attempt X1 to call pt back regarding med questions and concerns   No response noted.  Left discrete VM appropriately.

## 2024-04-30 NOTE — Telephone Encounter (Signed)
 Please Review   Patricia Lawson

## 2024-05-08 ENCOUNTER — Ambulatory Visit: Payer: Medicare Other | Admitting: Emergency Medicine

## 2024-05-08 VITALS — Ht 61.0 in | Wt 129.0 lb

## 2024-05-08 DIAGNOSIS — Z0000 Encounter for general adult medical examination without abnormal findings: Secondary | ICD-10-CM

## 2024-05-08 NOTE — Patient Instructions (Signed)
 Patricia Lawson , Thank you for taking time out of your busy schedule to complete your Annual Wellness Visit with me. I enjoyed our conversation and look forward to speaking with you again next year. I, as well as your care team,  appreciate your ongoing commitment to your health goals. Please review the following plan we discussed and let me know if I can assist you in the future. Your Game plan/ To Do List    Referrals: None  Follow up Visits: Next Medicare AWV with our clinical staff: 05/21/25 @ 10:40am (VIDEO VISIT)   Have you seen your provider in the last 6 months (3 months if uncontrolled diabetes)? Yes Next Office Visit with your provider: 05/14/24 @ 10:40am with Dr. Lemon  Clinician Recommendations: Keep up the good work!!  Aim for 30 minutes of exercise or brisk walking, 6-8 glasses of water, and 5 servings of fruits and vegetables each day.       This is a list of the screening recommended for you and due dates:  Health Maintenance  Topic Date Due   Hepatitis C Screening  06/24/2024*   COVID-19 Vaccine (4 - 2024-25 season) 05/11/2025*   Flu Shot  05/30/2024   Complete foot exam   06/24/2024   Eye exam for diabetics  07/19/2024   Hemoglobin A1C  10/25/2024   Yearly kidney health urinalysis for diabetes  12/26/2024   Yearly kidney function blood test for diabetes  04/25/2025   Medicare Annual Wellness Visit  05/08/2025   DEXA scan (bone density measurement)  07/02/2025   DTaP/Tdap/Td vaccine (3 - Td or Tdap) 06/20/2028   Pneumococcal Vaccine for age over 9  Completed   Hepatitis B Vaccine  Aged Out   HPV Vaccine  Aged Out   Meningitis B Vaccine  Aged Out   Zoster (Shingles) Vaccine  Discontinued  *Topic was postponed. The date shown is not the original due date.    Advanced directives: (ACP Link)Information on Advanced Care Planning can be found at [place]  Secretary of [facility] Advance Health Care Directives Advance Health Care Directives. [email or web address] You may also get the  forms at the doctor's office. Advance Care Planning is important because it:  [x]  Makes sure you receive the medical care that is consistent with your values, goals, and preferences  [x]  It provides guidance to your family and loved ones and reduces their decisional burden about whether or not they are making the right decisions based on your wishes.  Follow the link provided in your after visit summary or read over the paperwork we have mailed to you to help you started getting your Advance Directives in place. If you need assistance in completing these, please reach out to us  so that we can help you!  See attachments for Preventive Care and Fall Prevention Tips.   Fall Prevention in the Home, Adult Falls can cause injuries and affect people of all ages. There are many simple things that you can do to make your home safe and to help prevent falls. If you need it, ask for help making these changes. What actions can I take to prevent falls? General information Use good lighting in all rooms. Make sure to: Replace any light bulbs that burn out. Turn on lights if it is dark and use night-lights. Keep items that you use often in easy-to-reach places. Lower the shelves around your home if needed. Move furniture so that there are clear paths around it. Do not keep throw rugs or other things  on the floor that can make you trip. If any of your floors are uneven, fix them. Add color or contrast paint or tape to clearly mark and help you see: Grab bars or handrails. First and last steps of staircases. Where the edge of each step is. If you use a ladder or stepladder: Make sure that it is fully opened. Do not climb a closed ladder. Make sure the sides of the ladder are locked in place. Have someone hold the ladder while you use it. Know where your pets are as you move through your home. What can I do in the bathroom?     Keep the floor dry. Clean up any water that is on the floor right  away. Remove soap buildup in the bathtub or shower. Buildup makes bathtubs and showers slippery. Use non-skid mats or decals on the floor of the bathtub or shower. Attach bath mats securely with double-sided, non-slip rug tape. If you need to sit down while you are in the shower, use a non-slip stool. Install grab bars by the toilet and in the bathtub and shower. Do not use towel bars as grab bars. What can I do in the bedroom? Make sure that you have a light by your bed that is easy to reach. Do not use any sheets or blankets on your bed that hang to the floor. Have a firm bench or chair with side arms that you can use for support when you get dressed. What can I do in the kitchen? Clean up any spills right away. If you need to reach something above you, use a sturdy step stool that has a grab bar. Keep electrical cables out of the way. Do not use floor polish or wax that makes floors slippery. What can I do with my stairs? Do not leave anything on the stairs. Make sure that you have a light switch at the top and the bottom of the stairs. Have them installed if you do not have them. Make sure that there are handrails on both sides of the stairs. Fix handrails that are broken or loose. Make sure that handrails are as long as the staircases. Install non-slip stair treads on all stairs in your home if they do not have carpet. Avoid having throw rugs at the top or bottom of stairs, or secure the rugs with carpet tape to prevent them from moving. Choose a carpet design that does not hide the edge of steps on the stairs. Make sure that carpet is firmly attached to the stairs. Fix any carpet that is loose or worn. What can I do on the outside of my home? Use bright outdoor lighting. Repair the edges of walkways and driveways and fix any cracks. Clear paths of anything that can make you trip, such as tools or rocks. Add color or contrast paint or tape to clearly mark and help you see high doorway  thresholds. Trim any bushes or trees on the main path into your home. Check that handrails are securely fastened and in good repair. Both sides of all steps should have handrails. Install guardrails along the edges of any raised decks or porches. Have leaves, snow, and ice cleared regularly. Use sand, salt, or ice melt on walkways during winter months if you live where there is ice and snow. In the garage, clean up any spills right away, including grease or oil spills. What other actions can I take? Review your medicines with your health care provider. Some medicines can  make you confused or feel dizzy. This can increase your chance of falling. Wear closed-toe shoes that fit well and support your feet. Wear shoes that have rubber soles and low heels. Use a cane, walker, scooter, or crutches that help you move around if needed. Talk with your provider about other ways that you can decrease your risk of falls. This may include seeing a physical therapist to learn to do exercises to improve movement and strength. Where to find more information Centers for Disease Control and Prevention, [employer]: TonerPromos.no [employer] on Aging: BaseRingTones.pl National Institute on Aging: BaseRingTones.pl Contact a health care provider if: You are afraid of falling at home. You feel weak, drowsy, or dizzy at home. You fall at home. Get help right away if you: Lose consciousness or have trouble moving after a fall. Have a fall that causes a head injury. These symptoms may be an emergency. Get help right away. Call 911. Do not wait to see if the symptoms will go away. Do not drive yourself to the hospital. This information is not intended to replace advice given to you by your health care provider. Make sure you discuss any questions you have with your health care provider. Document Revised: 06/19/2022 Document Reviewed: 06/19/2022 Elsevier Patient Education  2024 [employer].

## 2024-05-08 NOTE — Progress Notes (Signed)
 Subjective:   Patricia Lawson is a 78 y.o. who presents for a Medicare Wellness preventive visit.  As a reminder, Annual Wellness Visits don't include a physical exam, and some assessments may be limited, especially if this visit is performed virtually. We may recommend an in-person follow-up visit with your provider if needed.  Visit Complete: Virtual I connected with  Patricia Lawson on 05/08/24 by a audio enabled telemedicine application and verified that I am speaking with the correct person using two identifiers.  Patient Location: Home  Provider Location: Home Office  I discussed the limitations of evaluation and management by telemedicine. The patient expressed understanding and agreed to proceed.  Vital Signs: Because this visit was a virtual/telehealth visit, some criteria may be missing or patient reported. Any vitals not documented were not able to be obtained and vitals that have been documented are patient reported.  VideoDeclined- This patient declined Librarian, academic. Therefore the visit was completed with audio only.  Persons Participating in Visit: Patient.  AWV Questionnaire: Yes: Patient Medicare AWV questionnaire was completed by the patient on 05/07/24; I have confirmed that all information answered by patient is correct and no changes since this date.  Cardiac Risk Factors include: advanced age (>41men, >54 women);diabetes mellitus;dyslipidemia;hypertension;Other (see comment), Risk factor comments: CAD     Objective:    Today's Vitals   05/08/24 1047  Weight: 129 lb (58.5 kg)  Height: 5' 1 (1.549 m)   Body mass index is 24.37 kg/m.     05/08/2024   11:04 AM 04/26/2023   10:32 AM 04/24/2022    3:44 PM 04/20/2021    1:30 PM 04/14/2020    1:38 PM 03/31/2019   11:09 AM 12/11/2018   11:21 AM  Advanced Directives  Does Patient Have a Medical Advance Directive? No No No No No No No   Would patient like information on creating a  medical advance directive? Yes (MAU/Ambulatory/Procedural Areas - Information given) No - Patient declined No - Patient declined Yes (MAU/Ambulatory/Procedural Areas - Information given) Yes (MAU/Ambulatory/Procedural Areas - Information given) Yes (MAU/Ambulatory/Procedural Areas - Information given)  No - Patient declined      Data saved with a previous flowsheet row definition    Current Medications (verified) Outpatient Encounter Medications as of 05/08/2024  Medication Sig   aspirin  EC 81 MG EC tablet Take 1 tablet (81 mg total) by mouth daily.   atorvastatin  (LIPITOR) 80 MG tablet Take 1 tablet (80 mg total) by mouth daily. Dr Florencio   Calcium  Carb-Cholecalciferol (CALCIUM  600+D) 600-20 MG-MCG TABS Take by mouth. (Patient taking differently: Take by mouth. Taking 2 tablets once daily)   irbesartan  (AVAPRO ) 75 MG tablet Take 1 tablet (75 mg total) by mouth daily. Dr Florencio   levothyroxine  (SYNTHROID ) 50 MCG tablet Take 1 tablet (50 mcg total) by mouth daily.   metoprolol  succinate (TOPROL -XL) 100 MG 24 hr tablet Take 1 tablet (100 mg total) by mouth daily. Dr Florencio   alendronate  (FOSAMAX ) 70 MG tablet Take 1 tablet (70 mg total) by mouth once a week. Take with a full glass of water on an empty stomach. (Patient not taking: Reported on 05/08/2024)   LOKELMA 10 g PACK packet Take 10 g by mouth daily. (Patient not taking: Reported on 05/08/2024)   metFORMIN  (GLUCOPHAGE -XR) 750 MG 24 hr tablet Take 1 tablet (750 mg total) by mouth daily with breakfast. (Patient not taking: Reported on 05/08/2024)   No facility-administered encounter medications on file as  of 05/08/2024.    Allergies (verified) Patient has no known allergies.   History: Past Medical History:  Diagnosis Date   CHF (congestive heart failure) (HCC) 2010   Coronary artery disease    Diabetes mellitus without complication (HCC)    type 2   History of MI (myocardial infarction) 03/31/2019   History of patellar fracture  11/25/2018   Hyperlipidemia    Hypertension    Myocardial infarction [facility]) 2010   5 stents placed   Painful orthopaedic hardware (HCC) 12/11/2018   Patella fracture 04/05/2018   Stroke (HCC) 05/2018   no deficits   Past Surgical History:  Procedure Laterality Date   CARDIAC CATHETERIZATION  2010   5 stents placed   CATARACT EXTRACTION W/ INTRAOCULAR LENS  IMPLANT, BILATERAL     HARDWARE REMOVAL Right 12/11/2018   Procedure: SCREW REMOVAL FROM PATELLA;  Surgeon: Edie Norleen PARAS, MD;  Location: [facility];  Service: Orthopedics;  Laterality: Right;  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   LAPAROSCOPIC APPENDECTOMY N/A 03/04/2015   Procedure: APPENDECTOMY LAPAROSCOPIC;  Surgeon: Oneil Chang, MD;  Location: [facility];  Service: General;  Laterality: N/A;   ORIF PATELLA Right 04/06/2018   Procedure: OPEN REDUCTION INTERNAL (ORIF) FIXATION PATELLA;  Surgeon: Tobie Priest, MD;  Location: [facility];  Service: Orthopedics;  Laterality: Right;   STENT PLACE LEFT URETER ([facility] HX)     cardiac   TENDON EXPLORATION Right 12/11/2018   Procedure: EXPLORATION OF PERONEAL NERVE LATERAL KNEE;  Surgeon: Edie Norleen PARAS, MD;  Location: [facility];  Service: Orthopedics;  Laterality: Right;  Diabetic - oral meds   THYROIDECTOMY Bilateral    Family History  Problem Relation Age of Onset   Heart block Mother    Heart disease Mother    Hypertension Mother    Heart block Father    Heart disease Father    Hypertension Father    Breast cancer Neg Hx    Social History   Socioeconomic History   Marital status: Divorced    Spouse name: Not on file   Number of children: 2   Years of education: Not on file   Highest education level: 12th grade  Occupational History   Occupation: retired  Tobacco Use   Smoking status: Former    Current packs/day: 0.00    Average packs/day: 0.1 packs/day for 42.3 years (4.2 ttl pk-yrs)    Types: Cigarettes    Start date: 1968    Quit date: 03/03/2009    Years  since quitting: 15.1   Smokeless tobacco: Never   Tobacco comments:    1 pack of cigarettes would last 1 week  Vaping Use   Vaping status: Never Used  Substance and Sexual Activity   Alcohol use: Yes    Comment: 1 glass of wine monthly or less (every 2-3 months)   Drug use: Never   Sexual activity: Not Currently  Other Topics Concern   Not on file  Social History Narrative   Pt lives alone.    Social Drivers of Corporate investment banker Strain: Low Risk  (05/08/2024)   Overall Financial Resource Strain (CARDIA)    Difficulty of Paying Living Expenses: Not hard at all  Food Insecurity: No Food Insecurity (05/08/2024)   Hunger Vital Sign    Worried About Running Out of Food in the Last Year: Never true    Ran Out of Food in the Last Year: Never true  Transportation Needs: No Transportation Needs (05/08/2024)  PRAPARE - Administrator, Civil Service (Medical): No    Lack of Transportation (Non-Medical): No  Physical Activity: Insufficiently Active (05/08/2024)   Exercise Vital Sign    Days of Exercise per Week: 2 days    Minutes of Exercise per Session: 20 min  Stress: No Stress Concern Present (05/08/2024)   [employer] of Occupational Health - Occupational Stress Questionnaire    Feeling of Stress: Not at all  Social Connections: Moderately Isolated (05/08/2024)   Social Connection and Isolation Panel    Frequency of Communication with Friends and Family: More than three times a week    Frequency of Social Gatherings with Friends and Family: Once a week    Attends Religious Services: More than 4 times per year    Active Member of [employer] or Organizations: No    Attends Engineer, structural: Never    Marital Status: Divorced    Tobacco Counseling Counseling given: Not Answered Tobacco comments: 1 pack of cigarettes would last 1 week    Clinical Intake:  Pre-visit preparation completed: Yes  Pain : No/denies pain     BMI - recorded:  24.37 Nutritional Status: BMI of 19-24  Normal Nutritional Risks: None Diabetes: Yes CBG done?: No (FBS 121) Did pt. bring in CBG monitor from home?: No  Lab Results  Component Value Date   HGBA1C 6.1 (H) 04/25/2024   HGBA1C 6.3 (H) 12/27/2023   HGBA1C 6.1 (H) 06/25/2023     How often do you need to have someone help you when you read instructions, pamphlets, or other written materials from your doctor or pharmacy?: 1 - Never  Interpreter Needed?: No  Information entered by :: Vina Ned, CMA   Activities of Daily Living     05/08/2024   10:54 AM 05/07/2024    2:51 PM  In your present state of health, do you have any difficulty performing the following activities:  Hearing? 0 0  Vision? 0 0  Difficulty concentrating or making decisions? 0 0  Walking or climbing stairs? 0 0  Dressing or bathing? 0 0  Doing errands, shopping? 0 0  Preparing Food and eating ? N N  Using the Toilet? N N  In the past six months, have you accidently leaked urine? N N  Do you have problems with loss of bowel control? N N  Managing your Medications? N N  Managing your Finances? N N  Housekeeping or managing your Housekeeping? N N    Patient Care Team: Lemon Raisin, MD as PCP - General (Internal Medicine) Florencio Cara BIRCH, MD as Consulting Physician (Cardiology) Sharlot Agent, OD (Optometry) Lateef, Munsoor, MD (Nephrology)  I have updated your Care Teams any recent Medical Services you may have received from other providers in the past year.     Assessment:   This is a routine wellness examination for Shayona.  Hearing/Vision screen Hearing Screening - Comments:: Denies hearing loss  Vision Screening - Comments:: Gets DM eye exams, Dr. Agent Sharlot, [place]   Goals Addressed             This Visit's Progress    Patient Stated       Maintain current health and activity level     COMPLETED: Weight (lb) < 140 lb (63.5 kg)   129 lb (58.5 kg)    Pt states she would like to  lose 10 lbs over the next year with diet and exercise       Depression Screen  05/08/2024   11:02 AM 04/25/2024    9:59 AM 12/27/2023   10:34 AM 06/25/2023   10:54 AM 04/26/2023   10:31 AM 02/19/2023   10:43 AM 04/24/2022    3:44 PM  PHQ 2/9 Scores  PHQ - 2 Score 0 0 0 0 0 0 0  PHQ- 9 Score 0 0 0 0 0 0     Fall Risk     05/08/2024   11:10 AM 05/07/2024    2:51 PM 04/25/2024    9:58 AM 12/27/2023   10:34 AM 06/25/2023   10:53 AM  Fall Risk   Falls in the past year? 0 0 0 0 0  Number falls in past yr: 0 0 0 0 0  Injury with Fall? 0 0 0 0 0  Risk for fall due to : No Fall Risks  No Fall Risks No Fall Risks No Fall Risks  Follow up Falls evaluation completed  Falls evaluation completed Falls evaluation completed Falls evaluation completed    MEDICARE RISK AT HOME:  Medicare Risk at Home Any stairs in or around the home?: Yes If so, are there any without handrails?: No Home free of loose throw rugs in walkways, pet beds, electrical cords, etc?: Yes Adequate lighting in your home to reduce risk of falls?: Yes Life alert?: No Use of a cane, walker or w/c?: No Grab bars in the bathroom?: No Shower chair or bench in shower?: Yes Elevated toilet seat or a handicapped toilet?: No  TIMED UP AND GO:  Was the test performed?  No  Cognitive Function: 6CIT completed        05/08/2024   11:11 AM 04/26/2023   10:37 AM 04/14/2020    1:41 PM 03/31/2019   11:17 AM  6CIT Screen  What Year? 0 points 0 points 0 points 0 points  What month? 0 points 0 points 0 points 0 points  What time? 0 points 0 points 0 points 0 points  Count back from 20 0 points 0 points 0 points 0 points  Months in reverse 0 points 0 points 0 points 0 points  Repeat phrase 0 points 2 points 0 points 0 points  Total Score 0 points 2 points 0 points 0 points    Immunizations Immunization History  Administered Date(s) Administered   PFIZER(Purple Top)SARS-COV-2 Vaccination 02/02/2020, 02/23/2020, 09/13/2020    Pneumococcal Conjugate-13 08/15/2018   Pneumococcal Polysaccharide-23 06/14/2020   Tdap 08/17/2015, 06/20/2018    Screening Tests Health Maintenance  Topic Date Due   Hepatitis C Screening  06/24/2024 (Originally 10/12/1964)   COVID-19 Vaccine (4 - 2024-25 season) 05/11/2025 (Originally 07/01/2023)   INFLUENZA VACCINE  05/30/2024   FOOT EXAM  06/24/2024   OPHTHALMOLOGY EXAM  07/19/2024   HEMOGLOBIN A1C  10/25/2024   Diabetic kidney evaluation - Urine ACR  12/26/2024   Diabetic kidney evaluation - eGFR measurement  04/25/2025   Medicare Annual Wellness (AWV)  05/08/2025   DEXA SCAN  07/02/2025   DTaP/Tdap/Td (3 - Td or Tdap) 06/20/2028   Pneumococcal Vaccine: 50+ Years  Completed   Hepatitis B Vaccines  Aged Out   HPV VACCINES  Aged Out   Meningococcal B Vaccine  Aged Out   Zoster Vaccines- Shingrix  Discontinued    Health Maintenance  There are no preventive care reminders to display for this patient.  Health Maintenance Items Addressed: See Nurse Notes at the end of this note  Additional Screening:  Vision Screening: Recommended annual ophthalmology exams for early detection of glaucoma  and other disorders of the eye. Would you like a referral to an eye doctor? No    Dental Screening: Recommended annual dental exams for proper oral hygiene  Community Resource Referral / Chronic Care Management: CRR required this visit?  No   CCM required this visit?  No   Plan:    I have personally reviewed and noted the following in the patient's chart:   Medical and social history Use of alcohol, tobacco or illicit drugs  Current medications and supplements including opioid prescriptions. Patient is not currently taking opioid prescriptions. Functional ability and status Nutritional status Physical activity Advanced directives List of other physicians Hospitalizations, surgeries, and ER visits in previous 12 months Vitals Screenings to include cognitive, depression, and  falls Referrals and appointments  In addition, I have reviewed and discussed with patient certain preventive protocols, quality metrics, and best practice recommendations. A written personalized care plan for preventive services as well as general preventive health recommendations were provided to patient.   Vina Ned, CMA   05/08/2024   After Visit Summary: (MyChart) Due to this being a telephonic visit, the after visit summary with patients personalized plan was offered to patient via MyChart   Notes:  FBS 121 this morning per patient Declined DM & Nutrition education referral Declined Covid and Shingles vaccines Screening colonoscopy and MMG no longer recommended due to age

## 2024-05-14 ENCOUNTER — Ambulatory Visit (INDEPENDENT_AMBULATORY_CARE_PROVIDER_SITE_OTHER): Admitting: Student

## 2024-05-14 ENCOUNTER — Encounter: Payer: Self-pay | Admitting: Student

## 2024-05-14 VITALS — BP 116/84 | HR 66 | Ht 61.0 in | Wt 128.4 lb

## 2024-05-14 DIAGNOSIS — M816 Localized osteoporosis [Lequesne]: Secondary | ICD-10-CM | POA: Diagnosis not present

## 2024-05-14 DIAGNOSIS — Z7984 Long term (current) use of oral hypoglycemic drugs: Secondary | ICD-10-CM

## 2024-05-14 DIAGNOSIS — E119 Type 2 diabetes mellitus without complications: Secondary | ICD-10-CM | POA: Diagnosis not present

## 2024-05-14 DIAGNOSIS — E875 Hyperkalemia: Secondary | ICD-10-CM | POA: Diagnosis not present

## 2024-05-14 HISTORY — DX: Hyperkalemia: E87.5

## 2024-05-14 MED ORDER — METOPROLOL SUCCINATE ER 100 MG PO TB24: 100.0000 mg | ORAL_TABLET | Freq: Every day | ORAL | Status: AC

## 2024-05-14 MED ORDER — CALCIUM CARB-CHOLECALCIFEROL 600-20 MG-MCG PO TABS: 2.0000 | ORAL_TABLET | Freq: Every day | ORAL | Status: AC

## 2024-05-14 NOTE — Assessment & Plan Note (Signed)
 Fasting CBGs reviewed since discontinue metformin  due to declining kidney function. Appears well controlled with readings typically between 100-110. Will continue with diet control and repeat A1c in 3 months.

## 2024-05-14 NOTE — Assessment & Plan Note (Addendum)
 K elevated at last visit, appears it has been slowly rising since she has been off lokelma since April from 5.4 to 6.1, she has taken 3 doses of lokelma since last visit. She prefers not to take lokelma due to taste and GI upset. RFP today. She will discuss long term option at upcoming visit with nephrology.

## 2024-05-14 NOTE — Patient Instructions (Addendum)
 It was good seeing you in clinic today Please continue to stop taking alendronate  and metformin   Please go to [facility] to check potassium, I will message you with the results Please discuss managing potassium with Dr. Marcelino at your next visit  I have made a referral to endocrinology to discuss an injection medication for osteoporosis since it is not safe to take Alendronate  with your level of kidney function  Follow up in 3 months for diabetes  Please bring you medicines and blood pressure cuff to your next appointment.

## 2024-05-14 NOTE — Assessment & Plan Note (Signed)
 Has stopped alendronate  due to CKD. Referral made to endocrinology may benefit from Denosumab.Continue calcium  and vitamin D supplementation.

## 2024-05-14 NOTE — Progress Notes (Signed)
 Established Patient Office Visit  Subjective   Patient ID: Patricia Lawson, female    DOB: [birth year]  Age: 78 y.o. MRN: [id number]  Chief Complaint  Patient presents with   Chronic Kidney Disease    Patricia Lawson is a 78 y.o. person living with history listed below presents today for follow up of hyperkalemia. She took 3 doses of lokelma after her last appointment. She denies muscle weakness/fatigue, n/v/d, abdominal pain, or palpitations   Patient Active Problem List   Diagnosis Date Noted   Hyperkalemia 05/14/2024   CKD (chronic kidney disease) stage 3, GFR 30-59 ml/min (HCC) 04/25/2024   Anemia of chronic disease 04/25/2024   Osteoporosis 04/25/2024   Primary hyperparathyroidism (HCC) 03/02/2020   Hypothyroidism 12/16/2019   Hyperlipidemia 03/31/2019   Type 2 diabetes mellitus without complication, without long-term current use of insulin  (HCC) 02/10/2019   HTN (hypertension) 06/20/2018   CAD (coronary artery disease) 06/20/2018      ROS Refer to HPI    Objective:     BP 116/84   Pulse 66   Ht 5' 1 (1.549 m)   Wt 128 lb 6 oz (58.2 kg)   SpO2 98%   BMI 24.26 kg/m  BP Readings from Last 3 Encounters:  05/14/24 116/84  04/25/24 104/60  01/14/24 118/68    Physical Exam Constitutional:      Comments: Frail appearing  Cardiovascular:     Rate and Rhythm: Normal rate and regular rhythm.  Pulmonary:     Effort: Pulmonary effort is normal.     Breath sounds: No rhonchi or rales.  Abdominal:     General: Abdomen is flat. Bowel sounds are normal. There is no distension.     Palpations: Abdomen is soft.     Tenderness: There is no abdominal tenderness.  Musculoskeletal:        General: Normal range of motion.     Right lower leg: No edema.     Left lower leg: No edema.  Skin:    General: Skin is warm and dry.     Capillary Refill: Capillary refill takes less than 2 seconds.  Neurological:     General: No focal deficit present.     Mental Status:  She is alert and oriented to person, place, and time.  Psychiatric:        Mood and Affect: Mood normal.        Behavior: Behavior normal.        05/14/2024   10:31 AM 05/08/2024   11:02 AM 04/25/2024    9:59 AM  Depression screen PHQ 2/9  Decreased Interest 1 0 0  Down, Depressed, Hopeless 0 0 0  PHQ - 2 Score 1 0 0  Altered sleeping 0 0 0  Tired, decreased energy 2 0 0  Change in appetite 0 0 0  Feeling bad or failure about yourself  0 0 0  Trouble concentrating 0 0 0  Moving slowly or fidgety/restless 0 0 0  Suicidal thoughts 0 0 0  PHQ-9 Score 3 0 0  Difficult doing work/chores Not difficult at all Not difficult at all Not difficult at all       05/14/2024   10:31 AM 04/25/2024    9:59 AM 12/27/2023   10:34 AM 06/25/2023   10:54 AM  GAD 7 : Generalized Anxiety Score  Nervous, Anxious, on Edge 0 0 0 0  Control/stop worrying 1 0 0 0  Worry too much - different things 1 0 0  0  Trouble relaxing 0 0 0 0  Restless 0 0 0 0  Easily annoyed or irritable 0 0 0 0  Afraid - awful might happen 0 0 0 0  Total GAD 7 Score 2 0 0 0  Anxiety Difficulty Not difficult at all Not difficult at all Not difficult at all Not difficult at all    No results found for any visits on 05/14/24.  Last CBC Lab Results  Component Value Date   WBC 7.9 10/17/2022   HGB 11.0 (L) 01/14/2024   HCT 33.3 (L) 10/17/2022   MCV 91 10/17/2022   MCH 30.8 10/17/2022   RDW 11.8 10/17/2022   PLT 180 10/17/2022   Last metabolic panel Lab Results  Component Value Date   GLUCOSE 104 (H) 04/25/2024   NA 138 04/25/2024   K 6.1 (H) 04/25/2024   CL 101 04/25/2024   CO2 20 04/25/2024   BUN 28 (H) 04/25/2024   CREATININE 1.94 (H) 04/25/2024   EGFR 26 (L) 04/25/2024   CALCIUM  10.4 (H) 04/25/2024   PHOS 3.6 04/25/2024   PROT 7.2 12/27/2023   ALBUMIN 4.5 04/25/2024   LABGLOB 3.1 12/27/2023   AGRATIO 2.0 02/19/2023   BILITOT 0.3 12/27/2023   ALKPHOS 74 12/27/2023   AST 20 12/27/2023   ALT 13 12/27/2023    ANIONGAP 7 06/14/2020   Last lipids Lab Results  Component Value Date   CHOL 158 12/27/2023   HDL 45 12/27/2023   LDLCALC 73 12/27/2023   TRIG 244 (H) 12/27/2023   CHOLHDL 4.9 06/21/2018   Last hemoglobin A1c Lab Results  Component Value Date   HGBA1C 6.1 (H) 04/25/2024      Assessment & Plan:  Hyperkalemia Assessment & Plan: K elevated at last visit, appears it has been slowly rising since she has been off lokelma since April from 5.4 to 6.1, she has taken 3 doses of lokelma since last visit. She prefers not to take lokelma due to taste and GI upset. RFP today. She will discuss long term option at upcoming visit with nephrology.   Orders: -     Renal function panel  Localized osteoporosis without current pathological fracture Assessment & Plan: Has stopped alendronate  due to CKD. Referral made to endocrinology may benefit from Denosumab.Continue calcium  and vitamin D supplementation.  Orders: -     Ambulatory referral to Endocrinology  Type 2 diabetes mellitus without complication, without long-term current use of insulin  (HCC) Assessment & Plan: Fasting CBGs reviewed since discontinue metformin  due to declining kidney function. Appears well controlled with readings typically between 100-110. Will continue with diet control and repeat A1c in 3 months.   Other orders -     Calcium  Carb-Cholecalciferol ; Take 2 tablets by mouth daily. Taking 2 tablets once daily -     Metoprolol  Succinate ER; Take 1 tablet (100 mg total) by mouth daily. Dr Florencio     Return in about 3 months (around 08/14/2024) for DM.    Harlene Saddler, MD

## 2024-05-15 ENCOUNTER — Ambulatory Visit: Payer: Self-pay | Admitting: Student

## 2024-05-15 DIAGNOSIS — E875 Hyperkalemia: Secondary | ICD-10-CM

## 2024-05-15 LAB — RENAL FUNCTION PANEL
Albumin: 4.5 g/dL (ref 3.8–4.8)
BUN/Creatinine Ratio: 16 (ref 12–28)
BUN: 27 mg/dL (ref 8–27)
CO2: 21 mmol/L (ref 20–29)
Calcium: 10.2 mg/dL (ref 8.7–10.3)
Chloride: 101 mmol/L (ref 96–106)
Creatinine, Ser: 1.73 mg/dL — ABNORMAL HIGH (ref 0.57–1.00)
Glucose: 99 mg/dL (ref 70–99)
Phosphorus: 4.2 mg/dL (ref 3.0–4.3)
Potassium: 6.2 mmol/L — ABNORMAL HIGH (ref 3.5–5.2)
Sodium: 137 mmol/L (ref 134–144)
eGFR: 30 mL/min/1.73 — ABNORMAL LOW (ref >59)

## 2024-05-22 DIAGNOSIS — E875 Hyperkalemia: Secondary | ICD-10-CM | POA: Diagnosis not present

## 2024-05-23 LAB — RENAL FUNCTION PANEL
Albumin: 4 g/dL (ref 3.8–4.8)
BUN/Creatinine Ratio: 13 (ref 12–28)
BUN: 21 mg/dL (ref 8–27)
CO2: 24 mmol/L (ref 20–29)
Calcium: 9.5 mg/dL (ref 8.7–10.3)
Chloride: 101 mmol/L (ref 96–106)
Creatinine, Ser: 1.65 mg/dL — ABNORMAL HIGH (ref 0.57–1.00)
Glucose: 122 mg/dL — ABNORMAL HIGH (ref 70–99)
Phosphorus: 2.5 mg/dL — ABNORMAL LOW (ref 3.0–4.3)
Potassium: 4.9 mmol/L (ref 3.5–5.2)
Sodium: 139 mmol/L (ref 134–144)
eGFR: 32 mL/min/1.73 — ABNORMAL LOW (ref >59)

## 2024-05-26 ENCOUNTER — Telehealth: Payer: Self-pay

## 2024-05-26 NOTE — Telephone Encounter (Signed)
 I spoke with patient and She stated the ones she changed.  Please call patient. Thanks.  JM

## 2024-05-26 NOTE — Telephone Encounter (Signed)
 Copied from CRM [id number]. Topic: General - Other >> May 26, 2024  8:17 AM [name] wrote: Reason for CRM: Patient called in stated she needs Dr.Liang to give her a call about question about her medication that has been changed

## 2024-05-26 NOTE — Telephone Encounter (Signed)
 Called patient, she had question regarding her metformin , reviewed recent home glucose measurements typically between 110-130, reiterated she does not need to restart metformin . Patient expressed understanding.

## 2024-06-02 DIAGNOSIS — N1832 Chronic kidney disease, stage 3b: Secondary | ICD-10-CM | POA: Diagnosis not present

## 2024-06-02 DIAGNOSIS — I10 Essential (primary) hypertension: Secondary | ICD-10-CM | POA: Diagnosis not present

## 2024-06-02 DIAGNOSIS — E1122 Type 2 diabetes mellitus with diabetic chronic kidney disease: Secondary | ICD-10-CM | POA: Diagnosis not present

## 2024-06-09 DIAGNOSIS — D631 Anemia in chronic kidney disease: Secondary | ICD-10-CM | POA: Diagnosis not present

## 2024-06-09 DIAGNOSIS — E875 Hyperkalemia: Secondary | ICD-10-CM | POA: Diagnosis not present

## 2024-06-09 DIAGNOSIS — I10 Essential (primary) hypertension: Secondary | ICD-10-CM | POA: Diagnosis not present

## 2024-06-09 DIAGNOSIS — E1122 Type 2 diabetes mellitus with diabetic chronic kidney disease: Secondary | ICD-10-CM | POA: Diagnosis not present

## 2024-06-09 DIAGNOSIS — N1832 Chronic kidney disease, stage 3b: Secondary | ICD-10-CM | POA: Diagnosis not present

## 2024-07-31 ENCOUNTER — Other Ambulatory Visit: Payer: Self-pay

## 2024-07-31 DIAGNOSIS — E039 Hypothyroidism, unspecified: Secondary | ICD-10-CM

## 2024-07-31 MED ORDER — LEVOTHYROXINE SODIUM 50 MCG PO TABS: 50.0000 ug | ORAL_TABLET | Freq: Every day | ORAL | 1 refills | Status: AC

## 2024-08-04 NOTE — Progress Notes (Signed)
 Established Patient Visit   Chief Complaint: Chief Complaint  Patient presents with  . Follow-up    12 month follow up   Date of Service: 08/07/2024 Date of Birth: 1946-01-26 PCP: Joshua Cathryne Rodney, MD  History of Present Illness: Patricia Lawson is a 78 y.o.female patient who presents for a 1 year follow up. PMH significant for hx of MI, CAD, hyperlipidemia, hx of CVA, hypertension, s/p stent, type 2 diabetes.  Today, pt presents with no new cardiac concerns. Patient states that she is doing well. Denies having any recent chest pain, SOB. Encouraged to regularly exercise. She exercises through walking some. No changes made today.   Visit Summaries: 08/15/2023 Patient was seen by me for a follow up and presented without any cardiac complaints. No changes were made  Past Medical and Surgical History  Past Medical History Past Medical History:  Diagnosis Date  . Acute myocardial infarction of anterolateral wall, initial episode of care (CMS/HHS-HCC)   . Coronary artery disease   . Hyperlipidemia   . Hypertension   . Myocardial infarction (CMS/HHS-HCC)   . Thyroid  disease    over active    treated with radiation    Past Surgical History She has a past surgical history that includes Cardiac catheterization; Appendectomy; extraction cataract extracapsular w/insertion intraocular prosthesis (Right, 04/16/2017); Eye surgery; extraction cataract extracapsular w/insertion intraocular prosthesis (Left, 04/23/2017); Exploration and neurolysis of right peroneal nerve at the fibular neck  (Right, 12/11/2018); and Removal of painful retained screw right patella (Right, 12/11/2018).   Medications and Allergies  Current Medications  Current Outpatient Medications  Medication Sig Dispense Refill  . aspirin  81 MG EC tablet Take 81 mg by mouth once daily.    . atorvastatin  (LIPITOR) 80 MG tablet Take 1 tablet (80 mg total) by mouth once daily 90 tablet 3  . calcium  carbonate-vitamin D3 (OS-CAL  500+D) 500 mg-10 mcg (400 unit) tablet Take 1 tablet by mouth 2 (two) times daily with meals    . irbesartan  (AVAPRO ) 75 MG tablet Take 1 tablet (75 mg total) by mouth once daily 90 tablet 3  . levothyroxine  (SYNTHROID ) 50 MCG tablet Take 50 mcg by mouth once daily    . metoprolol  SUCCinate (TOPROL -XL) 100 MG XL tablet Take 1 tablet (100 mg total) by mouth once daily 90 tablet 3  . alendronate  (FOSAMAX ) 70 MG tablet TAKE 1 TABLET (70 MG TOTAL) BY MOUTH EVERY 7 DAYS - TAKE WITH A FULL GLASS OF WATER ON AN EMPTY STOMACH    . metFORMIN  (GLUCOPHAGE -XR) 750 MG XR tablet Take 750 mg by mouth daily with breakfast     No current facility-administered medications for this visit.    Allergies: Patient has no known allergies.  Social and Family History  Social History  reports that she has quit smoking. She has never used smokeless tobacco. She reports current alcohol use of about 1.0 standard drink of alcohol per week. She reports that she does not use drugs.  Family History Family History  Problem Relation Name Age of Onset  . Heart disease Mother    . Heart disease Father    . Glaucoma Neg Hx    . Macular degeneration Neg Hx      Review of Systems   Pertinent positives and negatives are mentioned above in HPI and all other systems are negative.  Physical Examination   Vitals:BP 126/68   Pulse 65   Ht 154.9 cm (5' 1)   Wt 61.2 kg (135 lb)   SpO2  98%   BMI 25.51 kg/m  Ht:154.9 cm (5' 1) Wt:61.2 kg (135 lb) ADJ:Anib surface area is 1.62 meters squared. Body mass index is 25.51 kg/m.  HEENT: Pupils equally reactive to light and accomodation  Neck: Supple without thyromegaly, carotid pulses 2+ Lungs: clear to auscultation bilaterally; no wheezes, rales, rhonchi Heart: Regular rate and rhythm.  No gallops, murmurs or rub Abdomen: soft nontender, nondistended, with normal bowel sounds Extremities: no cyanosis, clubbing, or edema Peripheral Pulses: 2+ in all extremities, 2+ femoral  pulses bilaterally Neurologic: Alert and oriented X3; speech intact; face symmetrical; moves all extremities well  Cardiovascular Studies:    Echocardiogram 2D complete: 06/21/2018 Study Conclusions   - Left ventricle: The cavity size was normal. There was mild    concentric hypertrophy. Systolic function was normal. The    estimated ejection fraction was in the range of 60% to 65%. Wall    motion was normal; there were no regional wall motion    abnormalities. Doppler parameters are consistent with abnormal    left ventricular relaxation (grade 1 diastolic dysfunction).  - Aortic valve: There was mild regurgitation.   Impressions:   - No cardiac source of emboli was indentified.   NM Myocardial Perfusion SPECT multiple (stress and rest):  Cardiac Catheterization:   Holter:  Cardiac CT Scan:  Cardiac MRI:   Assessment   78 y.o. female with  1. Coronary artery disease involving native coronary artery of native heart without angina pectoris   2. S/P drug eluting coronary stent placement   3. Myocardial infarction, unspecified MI type, unspecified artery (CMS/HHS-HCC)   4. Cerebrovascular accident (CVA), unspecified mechanism (CMS/HHS-HCC)   5. Essential hypertension   6. Hyperlipidemia, unspecified hyperlipidemia type    Plan   Coronary artery disease, hx of MI, hx of CVA, s/p stent, no recent angina, continue medical therapy with aspirin  Lipitor metoprolol  irbesartan  Hypertension, well controlled, BP is 126/68, reasonably controlled, continue irbesartan  and metoprolol  therapy blood pressure goal less than 130/80 Hyperlipidemia continue Lipitor therapy for lipid management diet and exercise as well goal less than 70 History of CVA no significant persistent residual deficits continue blood pressure control and anticoagulation    Return in about 1 year (around 08/07/2025).  This note is partially written by Leita Ellen, in the presence of and acting as the scribe of  Dr. Cara Lovelace.      Leita Ellen  I have reviewed, edited and added to the note to reflect my best personal medical judgment.  Attestation Statement:   I personally performed the service. (TP)  DWAYNE JONETTA LOVELACE, MD  [facility] [place], [place] Ph:  [phone] Fax:  [phone] This note was generated in part with voice recognition software, Dragon.  I apologize for any typographical errors that were not detected and corrected from this process.  They are unintentional.

## 2024-08-07 DIAGNOSIS — I2510 Atherosclerotic heart disease of native coronary artery without angina pectoris: Secondary | ICD-10-CM | POA: Diagnosis not present

## 2024-08-07 DIAGNOSIS — E785 Hyperlipidemia, unspecified: Secondary | ICD-10-CM | POA: Diagnosis not present

## 2024-08-07 DIAGNOSIS — Z955 Presence of coronary angioplasty implant and graft: Secondary | ICD-10-CM | POA: Diagnosis not present

## 2024-08-07 DIAGNOSIS — I219 Acute myocardial infarction, unspecified: Secondary | ICD-10-CM | POA: Diagnosis not present

## 2024-08-07 DIAGNOSIS — I10 Essential (primary) hypertension: Secondary | ICD-10-CM | POA: Diagnosis not present

## 2024-08-07 DIAGNOSIS — I639 Cerebral infarction, unspecified: Secondary | ICD-10-CM | POA: Diagnosis not present

## 2024-08-13 ENCOUNTER — Telehealth: Payer: Self-pay | Admitting: Student

## 2024-08-13 ENCOUNTER — Encounter: Payer: Self-pay | Admitting: Student

## 2024-08-13 ENCOUNTER — Ambulatory Visit: Admitting: Student

## 2024-08-13 VITALS — BP 126/68 | HR 66 | Ht 61.0 in | Wt 134.2 lb

## 2024-08-13 DIAGNOSIS — M816 Localized osteoporosis [Lequesne]: Secondary | ICD-10-CM

## 2024-08-13 DIAGNOSIS — Z7984 Long term (current) use of oral hypoglycemic drugs: Secondary | ICD-10-CM

## 2024-08-13 DIAGNOSIS — E119 Type 2 diabetes mellitus without complications: Secondary | ICD-10-CM | POA: Diagnosis not present

## 2024-08-13 DIAGNOSIS — E039 Hypothyroidism, unspecified: Secondary | ICD-10-CM | POA: Diagnosis not present

## 2024-08-13 LAB — POCT GLYCOSYLATED HEMOGLOBIN (HGB A1C): Hemoglobin A1C: 7.1 % — AB (ref 4.0–5.6)

## 2024-08-13 NOTE — Progress Notes (Signed)
 Established Patient Office Visit  Subjective   Patient ID: Patricia Lawson, female    DOB: Nov 21, 1945  Age: 78 y.o. MRN: [id number]  Chief Complaint  Patient presents with   Diabetes    Patricia Lawson is a 78 year old person with medical hx listed below presents today for DM follow up. Feeling well today. Notes fasting CBGs are typically between 120-140 daily. Otherwise no acute complaints today. Please refer to problem based charting for further details and assessment and plan of current problem and chronic medical conditions.  Patient Active Problem List   Diagnosis Date Noted   CKD (chronic kidney disease) stage 3, GFR 30-59 ml/min (HCC) 04/25/2024   Anemia of chronic disease 04/25/2024   Osteoporosis 04/25/2024   Primary hyperparathyroidism 03/02/2020   Hypothyroidism 12/16/2019   Hyperlipidemia 03/31/2019   Type 2 diabetes mellitus without complication, without long-term current use of insulin  (HCC) 02/10/2019   HTN (hypertension) 06/20/2018   CAD (coronary artery disease) 06/20/2018      ROS Refer to HPI    Objective:     Outpatient Encounter Medications as of 08/13/2024  Medication Sig   aspirin  EC 81 MG EC tablet Take 1 tablet (81 mg total) by mouth daily.   atorvastatin  (LIPITOR) 80 MG tablet Take 1 tablet (80 mg total) by mouth daily. Dr Florencio   Calcium  Carb-Cholecalciferol  (CALCIUM  600+D) 600-20 MG-MCG TABS Take 2 tablets by mouth daily. Taking 2 tablets once daily   irbesartan  (AVAPRO ) 75 MG tablet Take 1 tablet (75 mg total) by mouth daily. Dr Florencio   levothyroxine  (SYNTHROID ) 50 MCG tablet Take 1 tablet (50 mcg total) by mouth daily.   metoprolol  succinate (TOPROL -XL) 100 MG 24 hr tablet Take 1 tablet (100 mg total) by mouth daily. Dr Florencio   [DISCONTINUED] LOKELMA 10 g PACK packet Take 10 g by mouth daily. (Patient not taking: Reported on 05/14/2024)   No facility-administered encounter medications on file as of 08/13/2024.    BP 126/68    Pulse 66   Ht 5' 1 (1.549 m)   Wt 134 lb 4 oz (60.9 kg)   SpO2 95%   BMI 25.37 kg/m  BP Readings from Last 3 Encounters:  08/13/24 126/68  05/14/24 116/84  04/25/24 104/60    Physical Exam Constitutional:      Appearance: Normal appearance.  HENT:     Head: Normocephalic and atraumatic.     Mouth/Throat:     Mouth: Mucous membranes are moist.     Pharynx: Oropharynx is clear.  Eyes:     Extraocular Movements: Extraocular movements intact.     Pupils: Pupils are equal, round, and reactive to light.  Cardiovascular:     Rate and Rhythm: Normal rate and regular rhythm.  Pulmonary:     Effort: Pulmonary effort is normal.     Breath sounds: No rhonchi or rales.  Abdominal:     General: Abdomen is flat. Bowel sounds are normal. There is no distension.     Palpations: Abdomen is soft.     Tenderness: There is no abdominal tenderness.  Musculoskeletal:        General: Normal range of motion.     Right lower leg: No edema.     Left lower leg: No edema.  Skin:    General: Skin is warm and dry.     Capillary Refill: Capillary refill takes less than 2 seconds.  Neurological:     General: No focal deficit present.  Mental Status: She is alert and oriented to person, place, and time.  Psychiatric:        Mood and Affect: Mood normal.        Behavior: Behavior normal.    Diabetic Foot Exam - Simple   Simple Foot Form Diabetic Foot exam was performed with the following findings: Yes 08/13/2024 10:49 AM  Visual Inspection No deformities, no ulcerations, no other skin breakdown bilaterally: Yes Sensation Testing Intact to touch and monofilament testing bilaterally: Yes Pulse Check Posterior Tibialis and Dorsalis pulse intact bilaterally: Yes Comments         05/14/2024   10:31 AM 05/08/2024   11:02 AM 04/25/2024    9:59 AM  Depression screen PHQ 2/9  Decreased Interest 1 0 0  Down, Depressed, Hopeless 0 0 0  PHQ - 2 Score 1 0 0  Altered sleeping 0 0 0  Tired,  decreased energy 2 0 0  Change in appetite 0 0 0  Feeling bad or failure about yourself  0 0 0  Trouble concentrating 0 0 0  Moving slowly or fidgety/restless 0 0 0  Suicidal thoughts 0 0 0  PHQ-9 Score 3 0 0  Difficult doing work/chores Not difficult at all Not difficult at all Not difficult at all       05/14/2024   10:31 AM 04/25/2024    9:59 AM 12/27/2023   10:34 AM 06/25/2023   10:54 AM  GAD 7 : Generalized Anxiety Score  Nervous, Anxious, on Edge 0 0 0 0  Control/stop worrying 1 0 0 0  Worry too much - different things 1 0 0 0  Trouble relaxing 0 0 0 0  Restless 0 0 0 0  Easily annoyed or irritable 0 0 0 0  Afraid - awful might happen 0 0 0 0  Total GAD 7 Score 2 0 0 0  Anxiety Difficulty Not difficult at all Not difficult at all Not difficult at all Not difficult at all    Results for orders placed or performed in visit on 08/13/24  POCT glycosylated hemoglobin (Hb A1C)  Result Value Ref Range   Hemoglobin A1C 7.1 (A) 4.0 - 5.6 %    Last CBC Lab Results  Component Value Date   WBC 7.9 10/17/2022   HGB 11.0 (L) 01/14/2024   HCT 33.3 (L) 10/17/2022   MCV 91 10/17/2022   MCH 30.8 10/17/2022   RDW 11.8 10/17/2022   PLT 180 10/17/2022   Last metabolic panel Lab Results  Component Value Date   GLUCOSE 122 (H) 05/22/2024   NA 139 05/22/2024   K 4.9 05/22/2024   CL 101 05/22/2024   CO2 24 05/22/2024   BUN 21 05/22/2024   CREATININE 1.65 (H) 05/22/2024   EGFR 32 (L) 05/22/2024   CALCIUM  9.5 05/22/2024   PHOS 2.5 (L) 05/22/2024   PROT 7.2 12/27/2023   ALBUMIN 4.0 05/22/2024   LABGLOB 3.1 12/27/2023   AGRATIO 2.0 02/19/2023   BILITOT 0.3 12/27/2023   ALKPHOS 74 12/27/2023   AST 20 12/27/2023   ALT 13 12/27/2023   ANIONGAP 7 06/14/2020   Last lipids Lab Results  Component Value Date   CHOL 158 12/27/2023   HDL 45 12/27/2023   LDLCALC 73 12/27/2023   TRIG 244 (H) 12/27/2023   CHOLHDL 4.9 06/21/2018   Last hemoglobin A1c Lab Results  Component Value  Date   HGBA1C 7.1 (A) 08/13/2024      The ASCVD Risk score (Arnett DK, et al.,  2019) failed to calculate for the following reasons:   Risk score cannot be calculated because patient has a medical history suggesting prior/existing ASCVD    Assessment & Plan:  Type 2 diabetes mellitus without complication, without long-term current use of insulin  (HCC) Assessment & Plan: Currently diet controlled. Reports home fasting CBGs typically around 120-140. A1c today 7.1%. Reports has been eating more cereal lately. Discussed dietary modification and exercise. Repeat A1c at next visit. Normal urine microalbumin of 28 in 05/2024. -Will continue off metformin .  -Foot exam today unremarkable -Discussed she is due to diabetic eye exam.   Orders: -     POCT glycosylated hemoglobin (Hb A1C)  Hypothyroidism, unspecified type Assessment & Plan: TSH today.   Orders: -     TSH + free T4  Localized osteoporosis without current pathological fracture Assessment & Plan: Was called by [facility] endo to schedule and told there is a [place] location. Will discuss with referral team. If unavailable she is agreeable to going to [place].       Return in about 4 months (around 12/14/2024) for DM.    Harlene Saddler, MD

## 2024-08-13 NOTE — Telephone Encounter (Signed)
 Patient requesting endocrinology referral for osteoporosis in [place].

## 2024-08-13 NOTE — Assessment & Plan Note (Addendum)
 Currently diet controlled. Reports home fasting CBGs typically around 120-140. A1c today 7.1%. Reports has been eating more cereal lately. Discussed dietary modification and exercise. Repeat A1c at next visit. Normal urine microalbumin of 28 in 05/2024. -Will continue off metformin .  -Foot exam today unremarkable -Discussed she is due to diabetic eye exam.

## 2024-08-13 NOTE — Assessment & Plan Note (Signed)
 TSH today

## 2024-08-13 NOTE — Assessment & Plan Note (Signed)
 Was called by [facility] endo to schedule and told there is a [place] location. Will discuss with referral team. If unavailable she is agreeable to going to [place].

## 2024-08-14 ENCOUNTER — Ambulatory Visit: Payer: Self-pay | Admitting: Student

## 2024-08-14 LAB — TSH+FREE T4
Free T4: 1.47 ng/dL (ref 0.82–1.77)
TSH: 2.43 u[IU]/mL (ref 0.450–4.500)

## 2024-08-14 NOTE — Addendum Note (Signed)
 Addended by: LEMON RAISIN on: 08/14/2024 07:50 AM   Modules accepted: Orders

## 2024-09-02 DIAGNOSIS — M810 Age-related osteoporosis without current pathological fracture: Secondary | ICD-10-CM | POA: Diagnosis not present

## 2024-09-02 DIAGNOSIS — Z1331 Encounter for screening for depression: Secondary | ICD-10-CM | POA: Diagnosis not present

## 2024-10-09 DIAGNOSIS — N1832 Chronic kidney disease, stage 3b: Secondary | ICD-10-CM | POA: Diagnosis not present

## 2024-10-09 DIAGNOSIS — E1122 Type 2 diabetes mellitus with diabetic chronic kidney disease: Secondary | ICD-10-CM | POA: Diagnosis not present

## 2024-10-09 DIAGNOSIS — I10 Essential (primary) hypertension: Secondary | ICD-10-CM | POA: Diagnosis not present

## 2024-10-14 DIAGNOSIS — I10 Essential (primary) hypertension: Secondary | ICD-10-CM | POA: Diagnosis not present

## 2024-10-14 DIAGNOSIS — E1122 Type 2 diabetes mellitus with diabetic chronic kidney disease: Secondary | ICD-10-CM | POA: Diagnosis not present

## 2024-10-14 DIAGNOSIS — D631 Anemia in chronic kidney disease: Secondary | ICD-10-CM | POA: Diagnosis not present

## 2024-10-14 DIAGNOSIS — N1832 Chronic kidney disease, stage 3b: Secondary | ICD-10-CM | POA: Diagnosis not present

## 2024-10-14 DIAGNOSIS — E875 Hyperkalemia: Secondary | ICD-10-CM | POA: Diagnosis not present

## 2024-10-17 ENCOUNTER — Ambulatory Visit: Admitting: Student

## 2024-12-17 ENCOUNTER — Ambulatory Visit: Admitting: Student

## 2025-05-21 ENCOUNTER — Ambulatory Visit
